# Patient Record
Sex: Female | Born: 1975
Health system: Southern US, Community
[De-identification: ages and names within clinical notes are randomized; demographics above are authoritative.]

## PROBLEM LIST (undated history)

## (undated) DIAGNOSIS — Z8619 Personal history of other infectious and parasitic diseases: Secondary | ICD-10-CM

## (undated) DIAGNOSIS — R51 Headache: Secondary | ICD-10-CM

## (undated) DIAGNOSIS — F32A Depression, unspecified: Secondary | ICD-10-CM

## (undated) DIAGNOSIS — I10 Essential (primary) hypertension: Secondary | ICD-10-CM

## (undated) DIAGNOSIS — F329 Major depressive disorder, single episode, unspecified: Secondary | ICD-10-CM

## (undated) DIAGNOSIS — K5790 Diverticulosis of intestine, part unspecified, without perforation or abscess without bleeding: Secondary | ICD-10-CM

## (undated) DIAGNOSIS — R519 Headache, unspecified: Secondary | ICD-10-CM

## (undated) DIAGNOSIS — R011 Cardiac murmur, unspecified: Secondary | ICD-10-CM

## (undated) DIAGNOSIS — O24419 Gestational diabetes mellitus in pregnancy, unspecified control: Secondary | ICD-10-CM

## (undated) HISTORY — DX: Headache: R51

## (undated) HISTORY — DX: Cardiac murmur, unspecified: R01.1

## (undated) HISTORY — DX: Major depressive disorder, single episode, unspecified: F32.9

## (undated) HISTORY — DX: Personal history of other infectious and parasitic diseases: Z86.19

## (undated) HISTORY — DX: Headache, unspecified: R51.9

## (undated) HISTORY — DX: Diverticulosis of intestine, part unspecified, without perforation or abscess without bleeding: K57.90

## (undated) HISTORY — DX: Gestational diabetes mellitus in pregnancy, unspecified control: O24.419

## (undated) HISTORY — DX: Essential (primary) hypertension: I10

## (undated) HISTORY — DX: Depression, unspecified: F32.A

---

## 2012-02-20 HISTORY — PX: EXCISIONAL HEMORRHOIDECTOMY: SHX1541

## 2014-06-04 ENCOUNTER — Emergency Department: Admit: 2014-06-04 | Discharge status: Against Medical Advice | Payer: Self-pay | Admitting: Student

## 2014-06-04 ENCOUNTER — Emergency Department
Admit: 2014-06-04 | Disposition: A | Discharge status: Home / Self Care | Payer: Self-pay | Admitting: Emergency Medicine

## 2014-06-04 LAB — URINALYSIS, COMPLETE
Bilirubin,UR: NEGATIVE
Blood: NEGATIVE
Glucose,UR: NEGATIVE mg/dL (ref 0–75)
Ketone: NEGATIVE
Leukocyte Esterase: NEGATIVE
NITRITE: NEGATIVE
PH: 6 (ref 4.5–8.0)
PROTEIN: NEGATIVE
SPECIFIC GRAVITY: 1.009 (ref 1.003–1.030)
Squamous Epithelial: NONE SEEN

## 2014-06-04 LAB — CBC WITH DIFFERENTIAL/PLATELET
Basophil #: 0.1 10*3/uL (ref 0.0–0.1)
Basophil %: 0.9 %
EOS PCT: 0.9 %
Eosinophil #: 0.1 10*3/uL (ref 0.0–0.7)
HCT: 40.6 % (ref 35.0–47.0)
HGB: 13.7 g/dL (ref 12.0–16.0)
Lymphocyte #: 2 10*3/uL (ref 1.0–3.6)
Lymphocyte %: 25 %
MCH: 30.8 pg (ref 26.0–34.0)
MCHC: 33.6 g/dL (ref 32.0–36.0)
MCV: 91 fL (ref 80–100)
Monocyte #: 0.5 x10 3/mm (ref 0.2–0.9)
Monocyte %: 6.6 %
Neutrophil #: 5.4 10*3/uL (ref 1.4–6.5)
Neutrophil %: 66.6 %
Platelet: 240 10*3/uL (ref 150–440)
RBC: 4.44 10*6/uL (ref 3.80–5.20)
RDW: 12.6 % (ref 11.5–14.5)
WBC: 8.1 10*3/uL (ref 3.6–11.0)

## 2014-06-04 LAB — COMPREHENSIVE METABOLIC PANEL
AST: 20 U/L
Albumin: 4.4 g/dL
Alkaline Phosphatase: 68 U/L
Anion Gap: 6 — ABNORMAL LOW (ref 7–16)
BUN: 9 mg/dL
Bilirubin,Total: 0.3 mg/dL
CO2: 27 mmol/L
Calcium, Total: 9.4 mg/dL
Chloride: 107 mmol/L
Creatinine: 0.6 mg/dL
EGFR (African American): >60
EGFR (Non-African Amer.): >60
GLUCOSE: 106 mg/dL — AB
POTASSIUM: 4.2 mmol/L
SGPT (ALT): 19 U/L
SODIUM: 140 mmol/L
TOTAL PROTEIN: 7.8 g/dL

## 2014-06-04 LAB — PREGNANCY, URINE: PREGNANCY TEST, URINE: NEGATIVE m[IU]/mL
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
## 2015-12-09 DIAGNOSIS — F411 Generalized anxiety disorder: Secondary | ICD-10-CM | POA: Diagnosis not present

## 2015-12-30 DIAGNOSIS — F411 Generalized anxiety disorder: Secondary | ICD-10-CM | POA: Diagnosis not present

## 2016-01-11 DIAGNOSIS — F411 Generalized anxiety disorder: Secondary | ICD-10-CM | POA: Diagnosis not present

## 2016-01-18 ENCOUNTER — Ambulatory Visit (INDEPENDENT_AMBULATORY_CARE_PROVIDER_SITE_OTHER): Payer: BLUE CROSS/BLUE SHIELD | Admitting: Obstetrics and Gynecology

## 2016-01-18 ENCOUNTER — Encounter: Payer: Self-pay | Admitting: Obstetrics and Gynecology

## 2016-01-18 VITALS — BP 132/91 | HR 69 | Resp 18 | Ht 64.0 in | Wt 195.0 lb

## 2016-01-18 DIAGNOSIS — Z124 Encounter for screening for malignant neoplasm of cervix: Secondary | ICD-10-CM

## 2016-01-18 DIAGNOSIS — Z01419 Encounter for gynecological examination (general) (routine) without abnormal findings: Secondary | ICD-10-CM | POA: Diagnosis not present

## 2016-01-18 DIAGNOSIS — M9901 Segmental and somatic dysfunction of cervical region: Secondary | ICD-10-CM | POA: Diagnosis not present

## 2016-01-18 DIAGNOSIS — Z113 Encounter for screening for infections with a predominantly sexual mode of transmission: Secondary | ICD-10-CM

## 2016-01-18 DIAGNOSIS — Z1151 Encounter for screening for human papillomavirus (HPV): Secondary | ICD-10-CM

## 2016-01-18 DIAGNOSIS — R51 Headache: Secondary | ICD-10-CM | POA: Diagnosis not present

## 2016-01-18 DIAGNOSIS — M5414 Radiculopathy, thoracic region: Secondary | ICD-10-CM | POA: Diagnosis not present

## 2016-01-18 DIAGNOSIS — M9902 Segmental and somatic dysfunction of thoracic region: Secondary | ICD-10-CM | POA: Diagnosis not present

## 2016-01-18 MED ORDER — ETONOGESTREL-ETHINYL ESTRADIOL 0.12-0.015 MG/24HR VA RING: VAGINAL_RING | VAGINAL | 12 refills | Status: DC

## 2016-01-18 NOTE — Progress Notes (Signed)
Subjective:     [name] is a 40 y.o. female G2P1011 who is here for a comprehensive physical exam. The patient reports no problems. She is not sexually active. She denies any abdominal pain or discharge. She reports a monthly period lasting 4-5 days. She is without any complaints  History reviewed. No pertinent past medical history. Past Surgical History:  Procedure Laterality Date  . EXCISIONAL HEMORRHOIDECTOMY     Family History  Problem Relation Age of Onset  . Heart disease Mother     Social History   Social History  . Marital status: Divorced    Spouse name: N/A  . Number of children: N/A  . Years of education: N/A   Occupational History  . Not on file.   Social History Main Topics  . Smoking status: Never Smoker  . Smokeless tobacco: Never Used  . Alcohol use Yes     Comment: occasional   . Drug use: No  . Sexual activity: Not Currently    Birth control/ protection: None   Other Topics Concern  . Not on file   Social History Narrative  . No narrative on file   Health Maintenance  Topic Date Due  . HIV Screening  10/19/1991  . TETANUS/TDAP  10/19/1995  . PAP SMEAR  10/18/1997  . INFLUENZA VACCINE  09/20/2015       Review of Systems Pertinent items are noted in HPI.   Objective:  Blood pressure (!) 132/91, pulse 69, resp. rate 18, height 5\' 4"  (1.626 m), weight 195 lb (88.5 kg), last menstrual period 01/02/2016.     GENERAL: Well-developed, well-nourished female in no acute distress.  HEENT: Normocephalic, atraumatic. Sclerae anicteric.  NECK: Supple. Normal thyroid.  LUNGS: Clear to auscultation bilaterally.  HEART: Regular rate and rhythm. BREASTS: Symmetric in size. No palpable masses or lymphadenopathy, skin changes, or nipple drainage. ABDOMEN: Soft, nontender, nondistended. No organomegaly. PELVIC: Normal external female genitalia. Vagina is pink and rugated.  Normal discharge. Normal appearing cervix. Uterus is normal in size. No  adnexal mass or tenderness. EXTREMITIES: No cyanosis, clubbing, or edema, 2+ distal pulses.    Assessment:    Healthy female exam.      Plan:    Pap smear collected Discussed contraception options and patient is interested in NuvaRing but is considering IUD. Rx NuvaRing provided Screening mammogram is due after her birthday Patient advised to perform monthly self breast and vulva exam Patient will be contacted with any abnormal results See After Visit Summary for Counseling Recommendations

## 2016-01-23 ENCOUNTER — Encounter: Payer: Self-pay | Admitting: *Deleted

## 2016-01-23 LAB — CYTOLOGY - PAP
Adequacy: ABSENT
CHLAMYDIA, DNA PROBE: NEGATIVE
DIAGNOSIS: NEGATIVE
HPV (WINDOPATH): NOT DETECTED
Neisseria Gonorrhea: NEGATIVE

## 2016-01-25 DIAGNOSIS — F411 Generalized anxiety disorder: Secondary | ICD-10-CM | POA: Diagnosis not present

## 2016-02-07 DIAGNOSIS — F411 Generalized anxiety disorder: Secondary | ICD-10-CM | POA: Diagnosis not present

## 2016-02-09 ENCOUNTER — Ambulatory Visit (INDEPENDENT_AMBULATORY_CARE_PROVIDER_SITE_OTHER): Payer: BLUE CROSS/BLUE SHIELD | Admitting: Obstetrics and Gynecology

## 2016-02-09 ENCOUNTER — Encounter: Payer: Self-pay | Admitting: Obstetrics and Gynecology

## 2016-02-09 DIAGNOSIS — I10 Essential (primary) hypertension: Secondary | ICD-10-CM

## 2016-02-09 DIAGNOSIS — Z309 Encounter for contraceptive management, unspecified: Secondary | ICD-10-CM

## 2016-02-10 DIAGNOSIS — I10 Essential (primary) hypertension: Secondary | ICD-10-CM | POA: Insufficient documentation

## 2016-02-10 NOTE — Progress Notes (Addendum)
Obstetrics and Gynecology Visit Return Patient Evaluation  Appointment Date: 02/09/2016  [facility]  [facility]  Chief Complaint:  IUD placement  History of Present Illness: [name] is a 40 y.o. African-American G2P1011 (Patient's last menstrual period was 01/30/2016.), seen for the above chief complaint. Her past medical history is significant for ?HTN.   She was seen by Dr. Jolayne Pantheronstant on 11/29 for an annual and [facility] her. Today, she desires some type of LNG IUD.  She does have painful and heavy periods and they are regular, qmonth, about 5-7d and no intermenstrual bleeding; she hasn't had an IUD before.  Her pap at her annual had negative GC/CT and HPV. Patient states that at her annual Dr. Jolayne Pantheronstant mentioned she felt like she had fibroids on her exam and she had never been told before that she had them.   Review of Systems: as noted in the History of Present Illness.  Past Medical History:  No past medical history on file.  Past Surgical History:  Past Surgical History:  Procedure Laterality Date  . EXCISIONAL HEMORRHOIDECTOMY      Past Obstetrical History:  OB History  Gravida Para Term Preterm AB Living  2 1 1   1 1   SAB TAB Ectopic Multiple Live Births  1       1    # Outcome Date GA Lbr Len/2nd Weight Sex Delivery Anes PTL Lv  2 Term 11/30/03 3223w0d  8 lb (3.629 kg) M Vag-Spont   LIV  1 SAB  1089w0d             Past Gynecological History: As per HPI.  Social History:  Social History   Social History  . Marital status: Divorced    Spouse name: N/A  . Number of children: N/A  . Years of education: N/A   Occupational History  . Not on file.   Social History Main Topics  . Smoking status: Never Smoker  . Smokeless tobacco: Never Used  . Alcohol use Yes     Comment: occasional   . Drug use: No  . Sexual activity: Not Currently    Birth control/ protection: None    Other Topics Concern  . Not on file   Social History Narrative  . No narrative on file    Family History:  Family History  Problem Relation Age of Onset  . Heart disease Mother     Unsure    Medications [name] had no medications administered during this visit. Current Outpatient Prescriptions  Medication Sig Dispense Refill  . Naproxen Sodium (ALEVE PO) Take 1 tablet by mouth as needed.     No current facility-administered medications for this visit.     Allergies Patient has no known allergies.   Physical Exam:  BP (!) 130/92   Pulse 82   Resp 16   Ht 5\' 4"  (1.626 m)   Wt 199 lb (90.3 kg)   LMP 01/30/2016   BMI 34.16 kg/m  Body mass index is 34.16 kg/m. General appearance: Well nourished, well developed female in no acute distress.   EGBUS normal  Laboratory: as above  Radiology: TVUS done  Midplane 9.1 x 4.4 x 4.8cm, normal appearing uterus, no fibroids noted, ES 10mm and uniform with no distortion of the cavity. no FF in the pelvis. Ovaries normal: LO 1.6 x 1.7 x 2cm and RO 2 x 1.1 x 1.2cm  Assessment: Pt doing  well  Plan:  Options d/w the patient and she desires a Mirena. We don't have any in stock so will order it today and bring her back next week for placement. U/s normal today.   RTC 1wk for Mirena placement.   >50% of face to face time for 8183m visit.   [name], Jr MD Attending [facility](Faculty Practice)

## 2016-02-14 ENCOUNTER — Ambulatory Visit (INDEPENDENT_AMBULATORY_CARE_PROVIDER_SITE_OTHER): Payer: BLUE CROSS/BLUE SHIELD | Admitting: Obstetrics and Gynecology

## 2016-02-14 ENCOUNTER — Encounter: Payer: Self-pay | Admitting: Obstetrics and Gynecology

## 2016-02-14 VITALS — BP 130/82 | HR 82 | Ht 64.0 in | Wt 199.0 lb

## 2016-02-14 DIAGNOSIS — Z30430 Encounter for insertion of intrauterine contraceptive device: Secondary | ICD-10-CM

## 2016-02-14 MED ORDER — LEVONORGESTREL 20 MCG/24HR IU IUD
INTRAUTERINE_SYSTEM | Freq: Once | INTRAUTERINE | Status: AC
  Administered 2016-02-14: 16:00:00 via INTRAUTERINE

## 2016-02-14 NOTE — Addendum Note (Signed)
Addended by: Arne ClevelandHUTCHINSON, MANDY J on: 02/14/2016 04:23 PM   Modules accepted: Orders

## 2016-02-14 NOTE — Progress Notes (Signed)
Intrauterine Device (Mirena) Insertion Procedure Note  After a recent pap (results: negative/date: 12/2015) was confirmed, written consent was obtained. The patient understands the risks of IUD placement, which include but are not limited to: bleeding, infection, uterine perforation, risk of expulsion, risk of failure < 1%, increased risk of ectopic pregnancy in the event of failure.   Prior to the procedure being performed, the patient (or guardian) was asked to state their full name, date of birth, and the type of procedure being performed. A bimanual exam showed the uterus to be anteverted.  Next, the cervix and vagina were cleaned with an antiseptic solution, and the cervix was grasped with a tenaculum.  The uterus was sounded to 8.5 cm.  The Mirena was placed without difficulty in the usual, sterile fashion.  The strings were cut to 3-4 cm.  The tenaculum was removed and cervix was found to be hemostatic after application of silver nitrate to the tenaculum sites.   No complications, patient tolerated the procedure well.  Cornelia Copaharlie Vishwa Dais, Jr MD Attending [facility](Faculty Practice)

## 2016-02-20 DIAGNOSIS — K5790 Diverticulosis of intestine, part unspecified, without perforation or abscess without bleeding: Secondary | ICD-10-CM

## 2016-02-20 HISTORY — DX: Diverticulosis of intestine, part unspecified, without perforation or abscess without bleeding: K57.90

## 2016-02-21 DIAGNOSIS — F411 Generalized anxiety disorder: Secondary | ICD-10-CM | POA: Diagnosis not present

## 2016-03-06 DIAGNOSIS — R030 Elevated blood-pressure reading, without diagnosis of hypertension: Secondary | ICD-10-CM | POA: Diagnosis not present

## 2016-03-06 DIAGNOSIS — Z131 Encounter for screening for diabetes mellitus: Secondary | ICD-10-CM | POA: Diagnosis not present

## 2016-03-15 ENCOUNTER — Ambulatory Visit: Payer: BLUE CROSS/BLUE SHIELD | Admitting: Obstetrics and Gynecology

## 2016-03-29 ENCOUNTER — Ambulatory Visit (INDEPENDENT_AMBULATORY_CARE_PROVIDER_SITE_OTHER): Payer: BLUE CROSS/BLUE SHIELD | Admitting: Obstetrics & Gynecology

## 2016-03-29 VITALS — BP 125/87 | HR 84 | Resp 18 | Ht 64.0 in | Wt 201.0 lb

## 2016-03-29 DIAGNOSIS — Z30431 Encounter for routine checking of intrauterine contraceptive device: Secondary | ICD-10-CM

## 2016-03-29 DIAGNOSIS — R51 Headache: Secondary | ICD-10-CM | POA: Diagnosis not present

## 2016-03-29 DIAGNOSIS — M9902 Segmental and somatic dysfunction of thoracic region: Secondary | ICD-10-CM | POA: Diagnosis not present

## 2016-03-29 DIAGNOSIS — M5414 Radiculopathy, thoracic region: Secondary | ICD-10-CM | POA: Diagnosis not present

## 2016-03-29 DIAGNOSIS — M9901 Segmental and somatic dysfunction of cervical region: Secondary | ICD-10-CM | POA: Diagnosis not present

## 2016-03-29 NOTE — Progress Notes (Signed)
   Subjective:    Patient ID: Julie LenisMichelle Vann Horton, female    DOB: Apr 23, 1975, 41 y.o.   MRN: [id number]  HPI  41 yo S AA P1 31(40 yo son) here for a string check. She has no pain and has had mostly daily small amount of spotting.  Review of Systems     Objective:   Physical Exam [id number] Breathing, conversing, and ambulating normally Mirena strings visualiazed, no bleeding noted       Assessment & Plan:  Mirena- rec IBU prn bleeding, offered OCPs prn but she doesn't want them at this time RTC 3 years/prn

## 2016-05-10 DIAGNOSIS — M9901 Segmental and somatic dysfunction of cervical region: Secondary | ICD-10-CM | POA: Diagnosis not present

## 2016-05-10 DIAGNOSIS — R51 Headache: Secondary | ICD-10-CM | POA: Diagnosis not present

## 2016-05-10 DIAGNOSIS — M5414 Radiculopathy, thoracic region: Secondary | ICD-10-CM | POA: Diagnosis not present

## 2016-05-10 DIAGNOSIS — M9902 Segmental and somatic dysfunction of thoracic region: Secondary | ICD-10-CM | POA: Diagnosis not present

## 2016-05-21 DIAGNOSIS — F331 Major depressive disorder, recurrent, moderate: Secondary | ICD-10-CM | POA: Diagnosis not present

## 2016-05-23 DIAGNOSIS — R109 Unspecified abdominal pain: Secondary | ICD-10-CM | POA: Diagnosis not present

## 2016-05-24 ENCOUNTER — Other Ambulatory Visit: Payer: Self-pay | Admitting: Family Medicine

## 2016-05-24 DIAGNOSIS — Z1389 Encounter for screening for other disorder: Secondary | ICD-10-CM | POA: Diagnosis not present

## 2016-05-24 DIAGNOSIS — R1011 Right upper quadrant pain: Secondary | ICD-10-CM

## 2016-05-24 DIAGNOSIS — R1031 Right lower quadrant pain: Secondary | ICD-10-CM | POA: Diagnosis not present

## 2016-05-24 DIAGNOSIS — R946 Abnormal results of thyroid function studies: Secondary | ICD-10-CM | POA: Diagnosis not present

## 2016-05-25 ENCOUNTER — Ambulatory Visit
Admission: RE | Admit: 2016-05-25 | Discharge: 2016-05-25 | Disposition: A | Discharge status: Home / Self Care | Payer: BLUE CROSS/BLUE SHIELD | Source: Ambulatory Visit | Attending: Family Medicine | Admitting: Family Medicine

## 2016-05-25 DIAGNOSIS — R1011 Right upper quadrant pain: Secondary | ICD-10-CM

## 2016-05-25 DIAGNOSIS — R1031 Right lower quadrant pain: Secondary | ICD-10-CM | POA: Diagnosis not present

## 2016-05-28 DIAGNOSIS — R1011 Right upper quadrant pain: Secondary | ICD-10-CM | POA: Diagnosis not present

## 2016-05-30 ENCOUNTER — Telehealth: Payer: Self-pay | Admitting: General Surgery

## 2016-05-30 NOTE — Telephone Encounter (Signed)
L/M FOR PT TO RETURN CALL SO WE CAN GET HER PRIMARY CARE INFORMATION(INCLUDING OFC #) AND RECORDS FOR APPOINTMENT ON 06-05-16.PLEASE LET MELANIE KNOW THIS INFORMATION.

## 2016-05-31 ENCOUNTER — Emergency Department
Admission: EM | Admit: 2016-05-31 | Discharge: 2016-05-31 | Disposition: A | Discharge status: Home / Self Care | Payer: BLUE CROSS/BLUE SHIELD | Source: Home / Self Care

## 2016-05-31 ENCOUNTER — Ambulatory Visit (INDEPENDENT_AMBULATORY_CARE_PROVIDER_SITE_OTHER): Payer: BLUE CROSS/BLUE SHIELD | Admitting: General Surgery

## 2016-05-31 ENCOUNTER — Encounter: Payer: Self-pay | Admitting: *Deleted

## 2016-05-31 ENCOUNTER — Ambulatory Visit
Admission: RE | Admit: 2016-05-31 | Discharge: 2016-05-31 | Disposition: A | Discharge status: Home / Self Care | Payer: BLUE CROSS/BLUE SHIELD | Source: Ambulatory Visit | Attending: General Surgery | Admitting: General Surgery

## 2016-05-31 ENCOUNTER — Encounter: Payer: Self-pay | Admitting: General Surgery

## 2016-05-31 ENCOUNTER — Other Ambulatory Visit
Admission: RE | Admit: 2016-05-31 | Discharge: 2016-05-31 | Disposition: A | Discharge status: Home / Self Care | Payer: BLUE CROSS/BLUE SHIELD | Source: Ambulatory Visit | Attending: General Surgery | Admitting: General Surgery

## 2016-05-31 VITALS — BP 128/76 | HR 70 | Temp 98.1°F | Resp 12 | Ht 64.0 in | Wt 194.0 lb

## 2016-05-31 DIAGNOSIS — R1031 Right lower quadrant pain: Secondary | ICD-10-CM

## 2016-05-31 DIAGNOSIS — Z5321 Procedure and treatment not carried out due to patient leaving prior to being seen by health care provider: Secondary | ICD-10-CM

## 2016-05-31 DIAGNOSIS — Z87891 Personal history of nicotine dependence: Secondary | ICD-10-CM | POA: Insufficient documentation

## 2016-05-31 DIAGNOSIS — R1110 Vomiting, unspecified: Secondary | ICD-10-CM | POA: Diagnosis not present

## 2016-05-31 DIAGNOSIS — R197 Diarrhea, unspecified: Secondary | ICD-10-CM | POA: Diagnosis not present

## 2016-05-31 DIAGNOSIS — K5720 Diverticulitis of large intestine with perforation and abscess without bleeding: Secondary | ICD-10-CM | POA: Diagnosis not present

## 2016-05-31 LAB — CBC WITH DIFFERENTIAL/PLATELET
Basophils Absolute: 0.1 10*3/uL (ref 0–0.1)
Basophils Relative: 1 %
EOS ABS: 0 10*3/uL (ref 0–0.7)
EOS PCT: 0 %
HCT: 39.6 % (ref 35.0–47.0)
Hemoglobin: 13.3 g/dL (ref 12.0–16.0)
Lymphocytes Relative: 12 %
Lymphs Abs: 1.3 10*3/uL (ref 1.0–3.6)
MCH: 29.9 pg (ref 26.0–34.0)
MCHC: 33.6 g/dL (ref 32.0–36.0)
MCV: 88.9 fL (ref 80.0–100.0)
Monocytes Absolute: 0.8 10*3/uL (ref 0.2–0.9)
Monocytes Relative: 8 %
Neutro Abs: 8.5 10*3/uL — ABNORMAL HIGH (ref 1.4–6.5)
Neutrophils Relative %: 79 %
Platelets: 292 10*3/uL (ref 150–440)
RBC: 4.45 MIL/uL (ref 3.80–5.20)
RDW: 12.9 % (ref 11.5–14.5)
WBC: 10.8 10*3/uL (ref 3.6–11.0)

## 2016-05-31 IMAGING — CT CT ABD-PELV W/ CM
2 of 4 series · 15 of 46 positions shown, 17 images · IV contrast (APPLIED)
Comparison: None.

CLINICAL DATA: Eleven day history of right lower quadrant abdominal
pain common nausea, vomiting and diarrhea.

EXAM:
CT ABDOMEN AND PELVIS WITH CONTRAST
TECHNIQUE: Multidetector CT imaging of the abdomen and pelvis was performed
using the standard protocol following bolus administration of
intravenous contrast.
CONTRAST:  100mL [PV] IOPAMIDOL ([PV]) INJECTION 61%

[Series 2: axial st · axial · 0.70mm/px · z∈[-450,-60]mm · 12 of 90 slices shown, 14 images]
[im 8/90  soft-tissue]
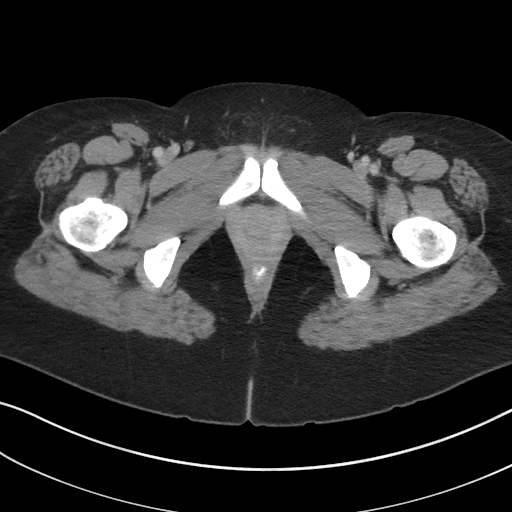
[im 8/90  bone]
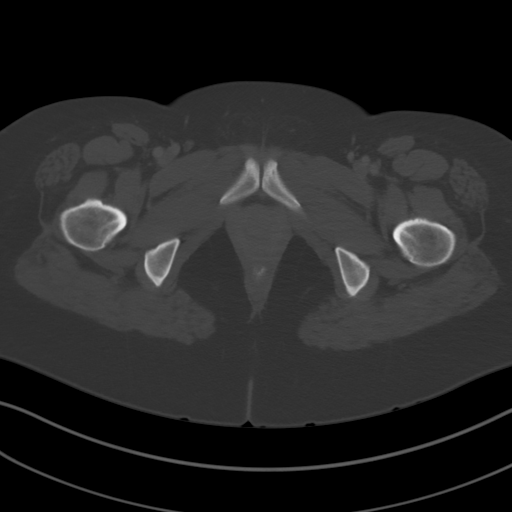
[im 15/90  soft-tissue]
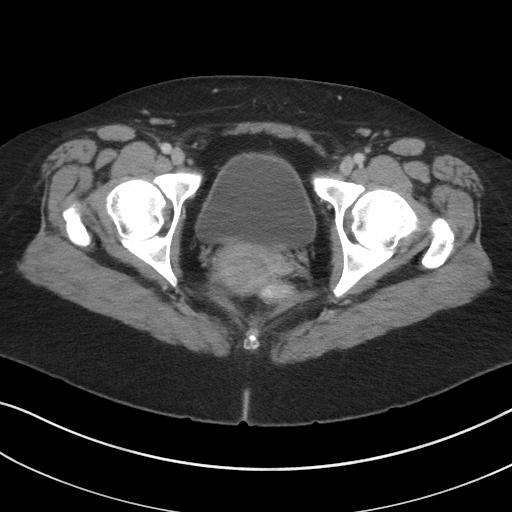
[im 22/90  soft-tissue]
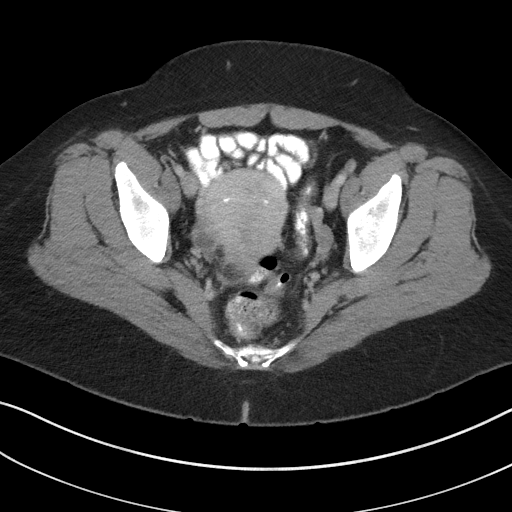
[im 29/90  soft-tissue]
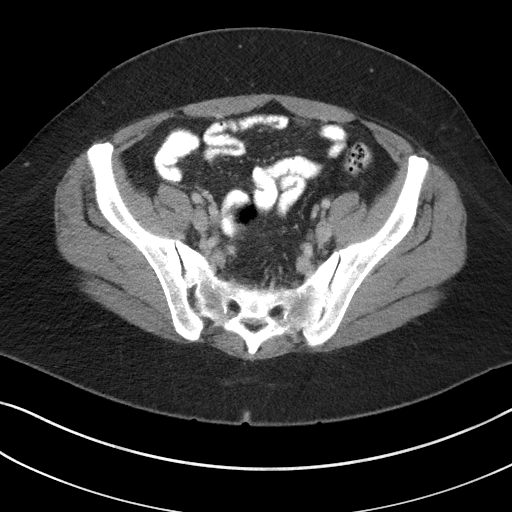
[im 36/90  soft-tissue]
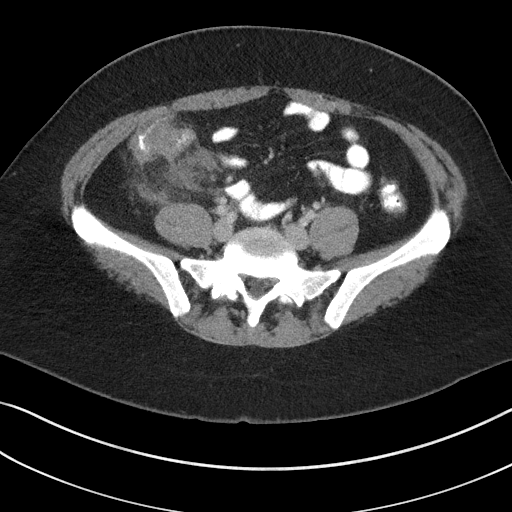
[im 43/90  soft-tissue]
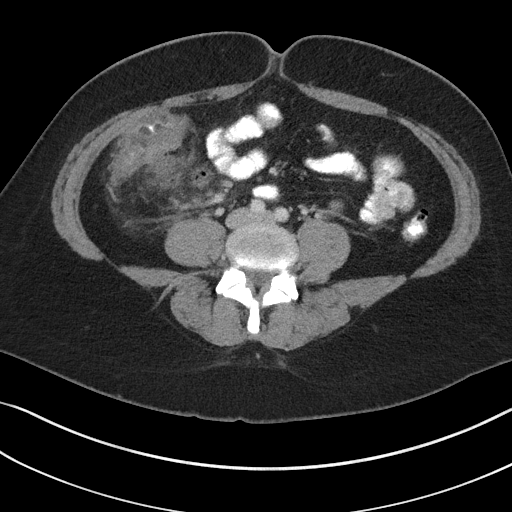
[im 50/90  soft-tissue]
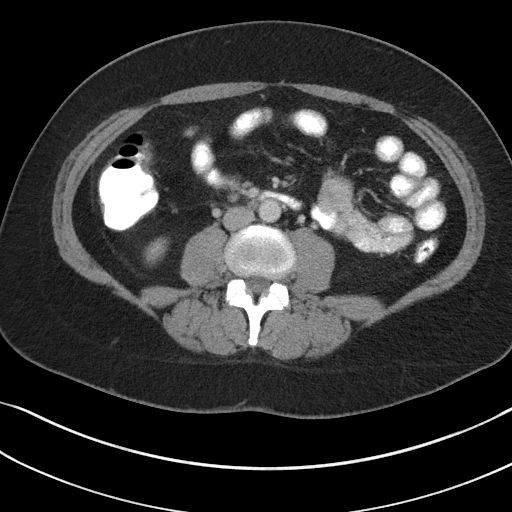
[im 57/90  soft-tissue]
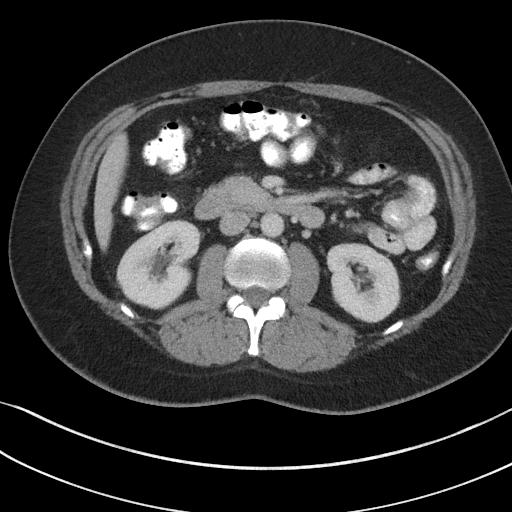
[im 65/90  soft-tissue]
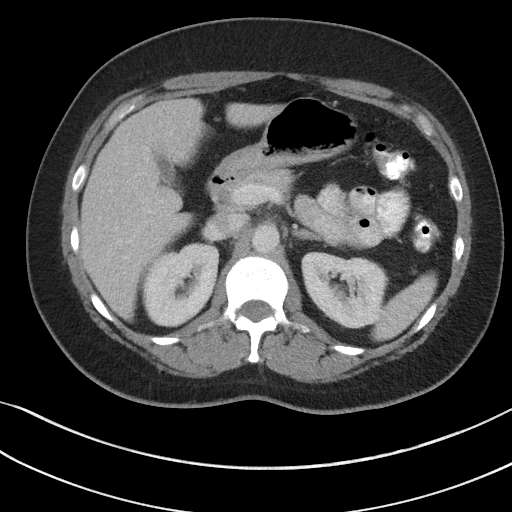
[im 65/90  bone]
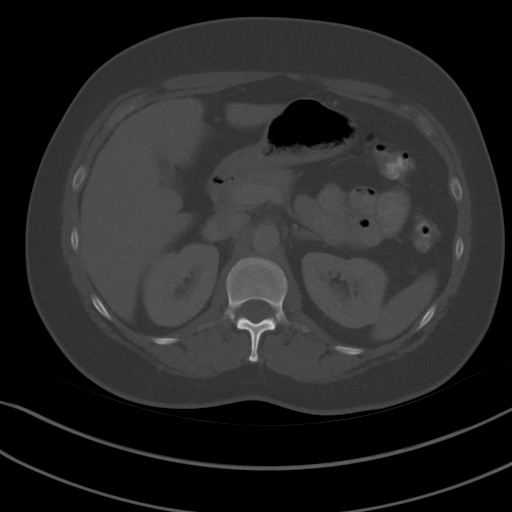
[im 72/90  soft-tissue]
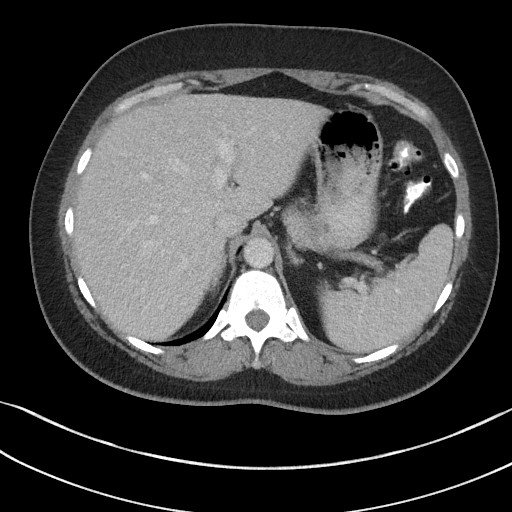
[im 79/90  soft-tissue]
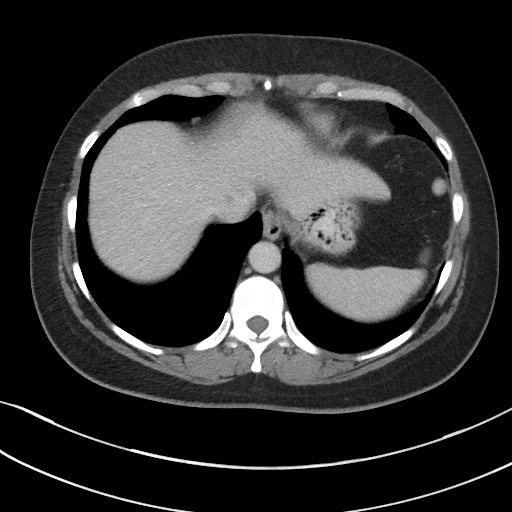
[im 86/90  soft-tissue]
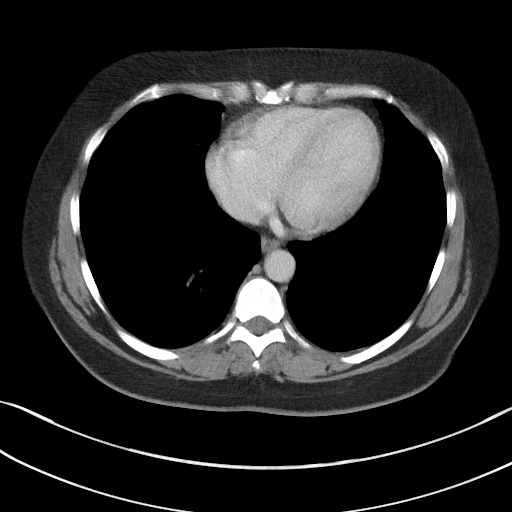

[Series 5: coronal st · coronal · 0.68mm/px · 3 of 80 slices shown]
[im 27/80  soft-tissue]
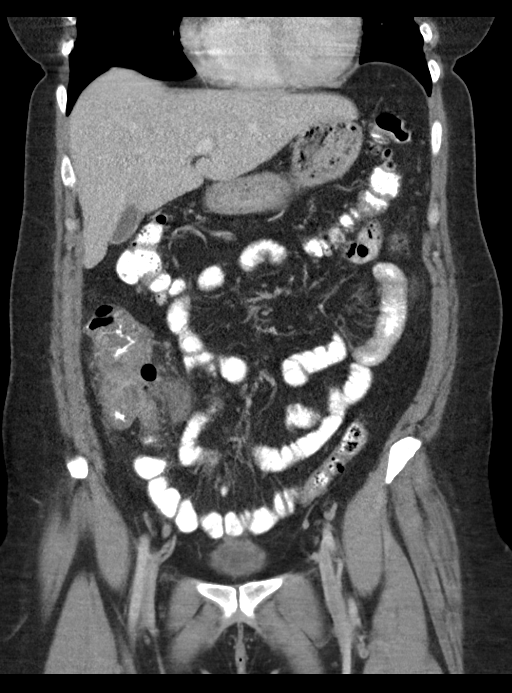
[im 36/80  soft-tissue]
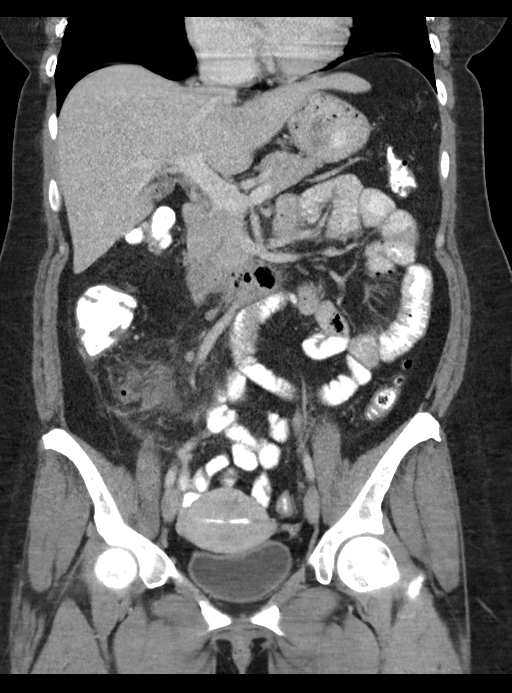
[im 44/80  soft-tissue]
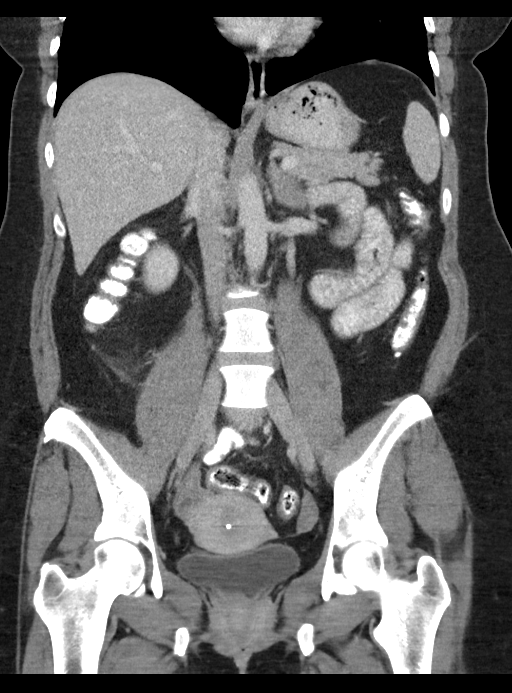

[15 of 46 positions shown; findings below may reference images not displayed]

FINDINGS: Lower chest: The lung bases are clear of acute process. No pleural
effusion or pulmonary lesions. The heart is normal in size. No
pericardial effusion. The distal esophagus and aorta are
unremarkable.

Hepatobiliary: No focal hepatic lesions or intrahepatic biliary
dilatation. The gallbladder is normal. No common bile duct
dilatation.

Pancreas:  No mass, inflammation or ductal dilatation.

Spleen: Normal size.  No focal lesions.

Adrenals/Urinary Tract: The adrenal glands and kidneys are
unremarkable. The bladder is normal.

Stomach/Bowel: The stomach, duodenum and small bowel are
unremarkable except at the terminal ileum is markedly inflamed. This
is likely secondary inflammation due to would appears to be
perforated cecal diverticulitis with a diverticular abscess in the
right lower quadrant measuring a maximum of 5.4 cm. The nearby
appendix is normal.

The remainder of the colon is unremarkable except for descending and
sigmoid diverticulosis.

Vascular/Lymphatic: The aorta and branch vessels are normal. The
major venous structures are patent. Small scattered mesenteric and
retroperitoneal lymph nodes and small pericecal inflammatory lymph
nodes.

Reproductive: The uterus and ovaries are unremarkable. An IUD is
noted in the endometrial canal.

Other: No pelvic mass or adenopathy. No free pelvic fluid
collections. No inguinal mass or adenopathy. No abdominal wall
hernia or subcutaneous lesions.

Musculoskeletal: No significant bony findings.
IMPRESSION: 1. CT findings most consistent with perforated cecal diverticulitis
with a diverticular abscess.
2. Secondary inflammation of the terminal ileum. The appendix is
normal.
3. No other significant abdominal/pelvic findings.

## 2016-05-31 MED ORDER — IOPAMIDOL (ISOVUE-300) INJECTION 61%
100.0000 mL | Freq: Once | INTRAVENOUS | Status: AC | PRN
  Administered 2016-05-31: 100 mL via INTRAVENOUS

## 2016-05-31 MED ORDER — SODIUM CHLORIDE 0.9 % IV SOLN
1.0000 g | Freq: Once | INTRAVENOUS | Status: AC
  Administered 2016-05-31: 1 g via INTRAVENOUS
  Filled 2016-05-31: qty 1
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
## 2016-05-31 NOTE — Addendum Note (Signed)
Addended by: Nicholes Mango on: 05/31/2016 04:06 PM   Modules accepted: Level of Service

## 2016-05-31 NOTE — ED Notes (Signed)
Called pharmacy to request medication 

## 2016-05-31 NOTE — ED Triage Notes (Signed)
Patient ambulatory to triage with steady gait, without difficulty or distress noted, brought over by Dr Birdie Sons from CT to receive IV dose of Invanz only; does not need to be evaluated by ED provider

## 2016-05-31 NOTE — Patient Instructions (Signed)
Patient to have a ct scan done.   

## 2016-05-31 NOTE — ED Triage Notes (Signed)
Pt here for iv antibodics only per dr byrnett.  Pt had ct scan today.  Pt alert.

## 2016-06-01 ENCOUNTER — Telehealth: Payer: Self-pay

## 2016-06-01 NOTE — Telephone Encounter (Signed)
Patient called to give an update on her condition. She states that she does feel a little better. Belly pain has lessened. Still has some chills but no fever. She is aware to call with any changes in her condition.

## 2016-06-05 ENCOUNTER — Ambulatory Visit (INDEPENDENT_AMBULATORY_CARE_PROVIDER_SITE_OTHER): Payer: BLUE CROSS/BLUE SHIELD | Admitting: General Surgery

## 2016-06-05 ENCOUNTER — Encounter: Payer: Self-pay | Admitting: General Surgery

## 2016-06-05 ENCOUNTER — Ambulatory Visit: Payer: Self-pay | Admitting: General Surgery

## 2016-06-05 VITALS — BP 124/76 | HR 78 | Temp 96.9°F | Resp 12 | Ht 64.0 in | Wt 195.0 lb

## 2016-06-05 DIAGNOSIS — K5732 Diverticulitis of large intestine without perforation or abscess without bleeding: Secondary | ICD-10-CM | POA: Diagnosis not present

## 2016-06-05 NOTE — Progress Notes (Signed)
Patient ID: [name], female   DOB: 1975/02/27, 41 y.o.   MRN: [id number]  Chief Complaint  Patient presents with  . Follow-up    HPI [name] is a 41 y.o. female here today for her follow up right abdominal pain. She states the pain is much better and the diarrhea has turn into to soft stools. No fevers or chills. The patient reports no ill effects from her Augmentin.  The patient is still experiencing some early satiety. No nausea or vomiting.  HPI  No past medical history on file.  Past Surgical History:  Procedure Laterality Date  . EXCISIONAL HEMORRHOIDECTOMY  2014    Family History  Problem Relation Age of Onset  . Heart disease Mother     Unsure    Social History Social History  Substance Use Topics  . Smoking status: Former Smoker    Years: 14.00    Types: Cigarettes  . Smokeless tobacco: Never Used  . Alcohol use Yes     Comment: occasional     No Known Allergies  Current Outpatient Prescriptions  Medication Sig Dispense Refill  . levonorgestrel (MIRENA) 20 MCG/24HR IUD 1 each by Intrauterine route once.     No current facility-administered medications for this visit.     Review of Systems Review of Systems  Constitutional: Negative.   Respiratory: Negative.   Gastrointestinal: Negative.     Blood pressure 124/76, pulse 78, temperature (!) 96.9 F (36.1 C), temperature source Oral, resp. rate 12, height  (1.626 m), weight 195 lb (88.5 kg).  Physical Exam Physical Exam  Constitutional: She is oriented to person, place, and time. She appears well-developed and well-nourished.  Cardiovascular: Normal rate, regular rhythm and normal heart sounds.   Pulmonary/Chest: Effort normal and breath sounds normal.  Abdominal: Soft. Normal appearance and bowel sounds are normal. There is no hepatomegaly. There is no tenderness.    No referred pain or peritoneal irritation.  Neurological: She is alert and oriented to person, place,  and time.      Assessment    Cecal diverticulitis versus appendicitis.    Plan    The patient is doing very well and looks significantly more comfortable. We'll plan for her to complete her present Augmentin Rx. She'll call if she develops any recurrent symptoms, otherwise will obtain a follow-up CT next week.      If her symptoms resolve she'll likely be a candidate for a colonoscopy to assess the right colon.  Patient to be scheduled for a CT abdomen/pelvis with contrast on 06/14/2016.  The patient is aware to call back for any questions or concerns.   HPI, Physical Exam, Assessment and Plan have been scribed under the direction and in the presence of Donnalee Curry, MD.  Ples Specter, CMA   I have completed the exam and reviewed the above documentation for accuracy and completeness.  I agree with the above.  Museum/gallery conservator has been used and any errors in dictation or transcription are unintentional.  Donnalee Curry, M.D., F.A.C.S. The patient is scheduled for a CT abdomen and pelvis at [facility] on 06/14/16 at 9:00 am. She will arrive by 8:45 am and will need to have nothing to eat or drink for 4 hours prior. The patient is aware of date, time, and instructions.  Documented by Sinda Du LPN   Earline Mayotte 06/06/2016, 9:17 PM

## 2016-06-05 NOTE — Patient Instructions (Addendum)
Patient to be scheduled for a CT abdomen/pelvis with contrast on 06/14/2016 The patient is aware to call back for any questions or concerns.  The patient is scheduled for a CT abdomen and pelvis at [facility] on 06/14/16 at 9:00 am. She will arrive by 8:45 am and will need to have nothing to eat or drink for 4 hours prior. The patient is aware of date, time, and instructions.

## 2016-06-13 ENCOUNTER — Telehealth: Payer: Self-pay | Admitting: *Deleted

## 2016-06-13 ENCOUNTER — Encounter: Payer: Self-pay | Admitting: *Deleted

## 2016-06-13 ENCOUNTER — Other Ambulatory Visit
Admission: RE | Admit: 2016-06-13 | Discharge: 2016-06-13 | Disposition: A | Discharge status: Home / Self Care | Payer: BLUE CROSS/BLUE SHIELD | Source: Ambulatory Visit | Attending: General Surgery | Admitting: General Surgery

## 2016-06-13 ENCOUNTER — Other Ambulatory Visit: Payer: Self-pay | Admitting: *Deleted

## 2016-06-13 DIAGNOSIS — Z3200 Encounter for pregnancy test, result unknown: Secondary | ICD-10-CM

## 2016-06-13 LAB — PREGNANCY, URINE: Preg Test, Ur: NEGATIVE

## 2016-06-13 NOTE — Telephone Encounter (Signed)
Message for patient to call the office.   Patient needs to have a urine pregnancy test at [facility] within 24 hours of CT scan per department since she has an IUD and no definite LMP date. CT scheduled for tomorrow, 06-14-16. Patient will need to go today so we can make sure they have results by scan time tomorrow.  Order in EPIC.

## 2016-06-13 NOTE — Telephone Encounter (Signed)
Patient called back and is aware. She will try and stop by [facility] and have this done by 5:30 pm today.

## 2016-06-14 ENCOUNTER — Ambulatory Visit
Admission: RE | Admit: 2016-06-14 | Discharge: 2016-06-14 | Disposition: A | Discharge status: Home / Self Care | Payer: BLUE CROSS/BLUE SHIELD | Source: Ambulatory Visit | Attending: General Surgery | Admitting: General Surgery

## 2016-06-14 ENCOUNTER — Telehealth: Payer: Self-pay

## 2016-06-14 DIAGNOSIS — K5732 Diverticulitis of large intestine without perforation or abscess without bleeding: Secondary | ICD-10-CM

## 2016-06-14 DIAGNOSIS — K5730 Diverticulosis of large intestine without perforation or abscess without bleeding: Secondary | ICD-10-CM | POA: Diagnosis not present

## 2016-06-14 IMAGING — CT CT ABD-PELV W/ CM
1 of 2 series · 15 of 32 positions shown, 19 images · IV contrast (APPLIED)
Comparison: [DATE]

CLINICAL DATA: Diverticulitis follow-up.

EXAM:
CT ABDOMEN AND PELVIS WITH CONTRAST
TECHNIQUE: Multidetector CT imaging of the abdomen and pelvis was performed
using the standard protocol following bolus administration of
intravenous contrast.
CONTRAST:  100mL [X3] IOPAMIDOL ([X3]) INJECTION 61%

[Series 2: axial st · axial · 0.71mm/px · z∈[-889,-454]mm · 15 of 95 slices shown, 19 images]
[im 4/95  soft-tissue]
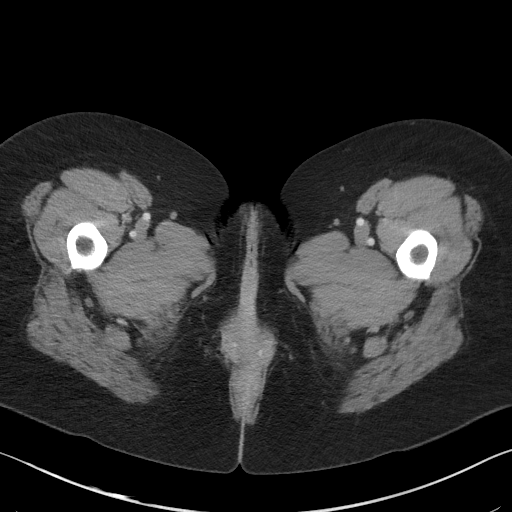
[im 4/95  bone]
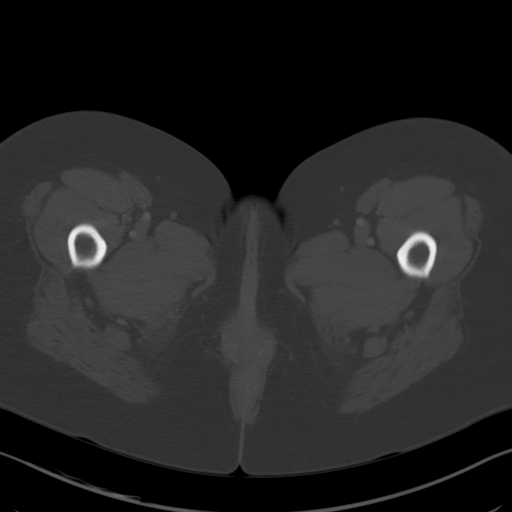
[im 12/95  soft-tissue]
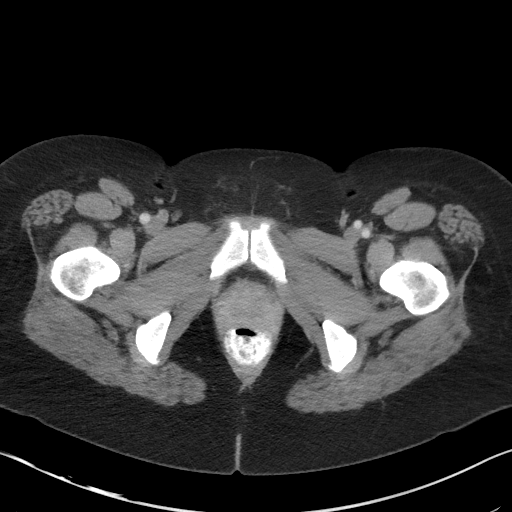
[im 20/95  soft-tissue]
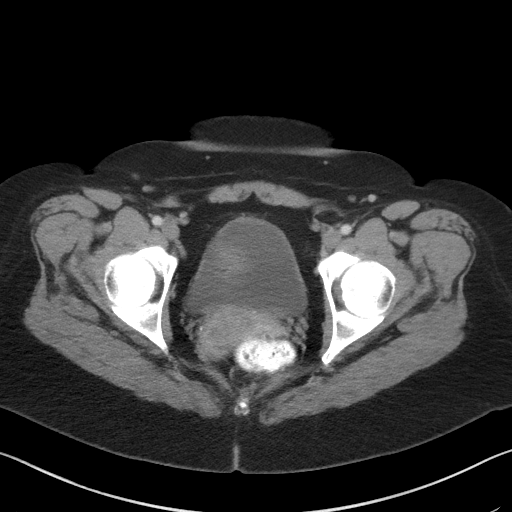
[im 28/95  soft-tissue]
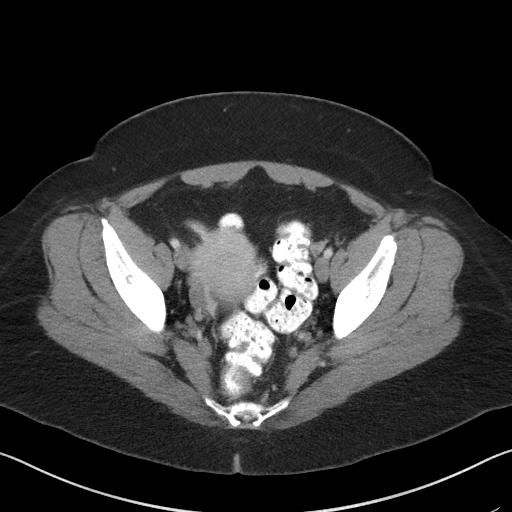
[im 32/95  soft-tissue]
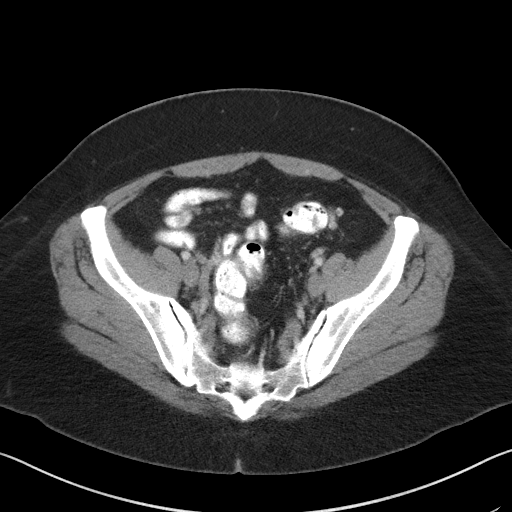
[im 40/95  soft-tissue]
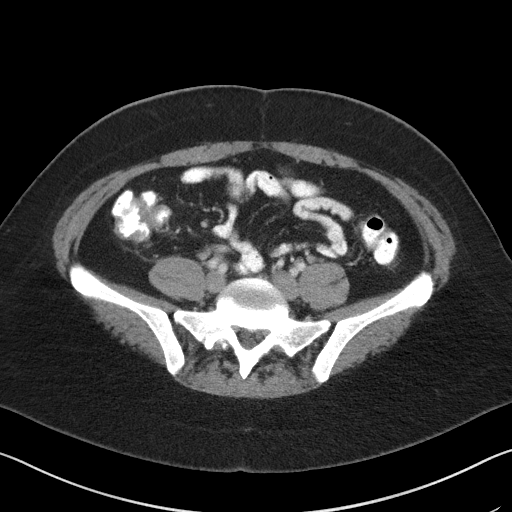
[im 48/95  soft-tissue]
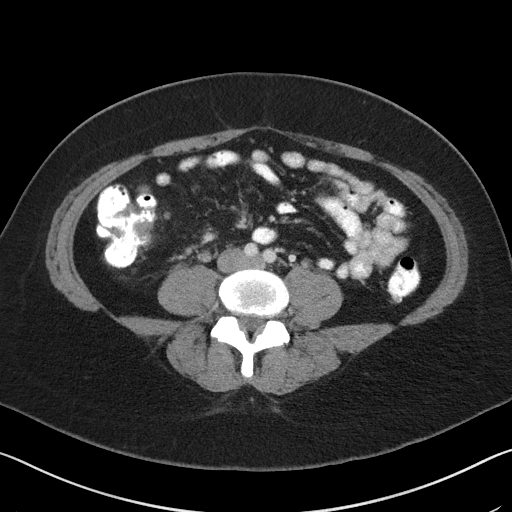
[im 55/95  soft-tissue]
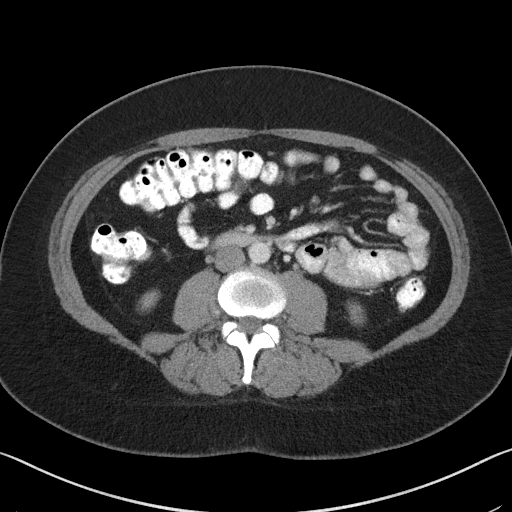
[im 63/95  soft-tissue]
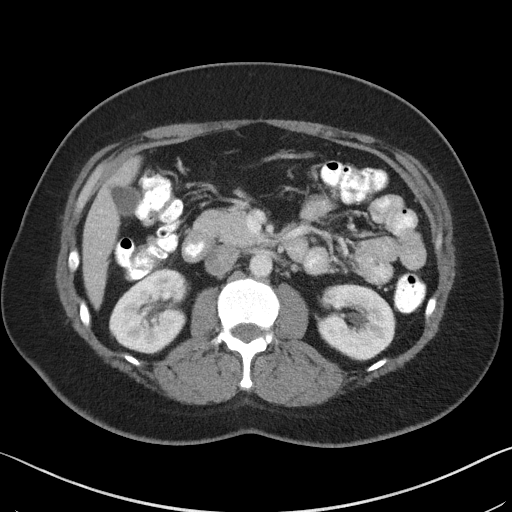
[im 63/95  bone]
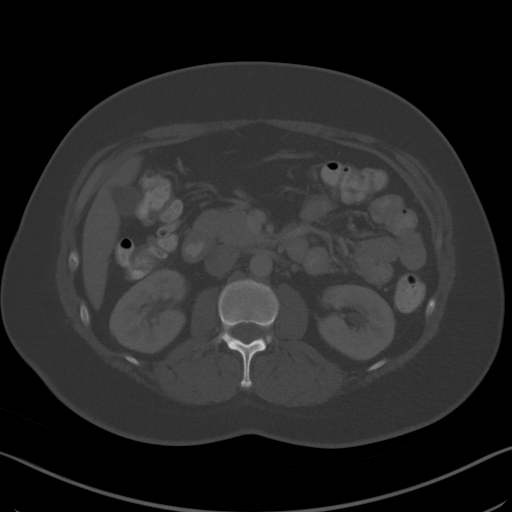
[im 67/95  soft-tissue]
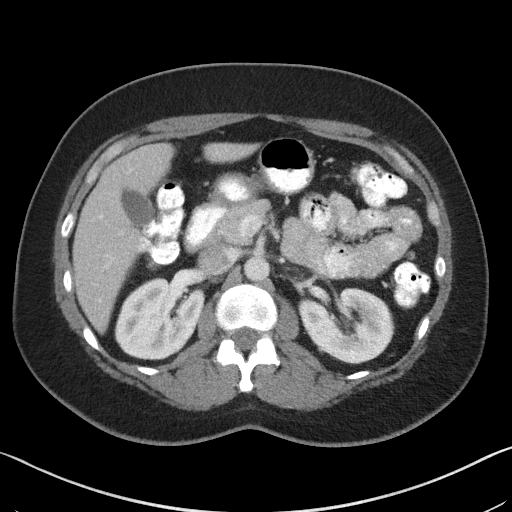
[im 75/95  soft-tissue]
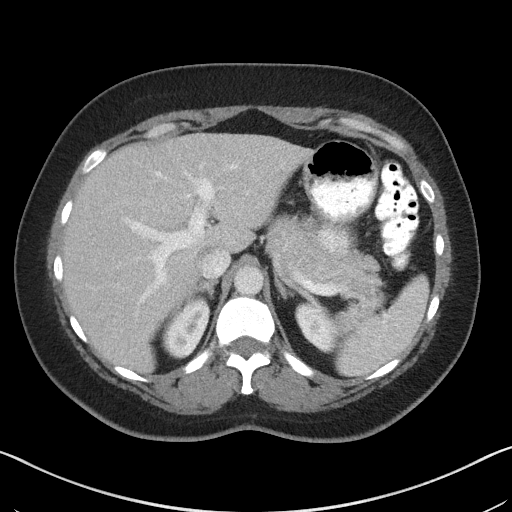
[im 79/95  lung]
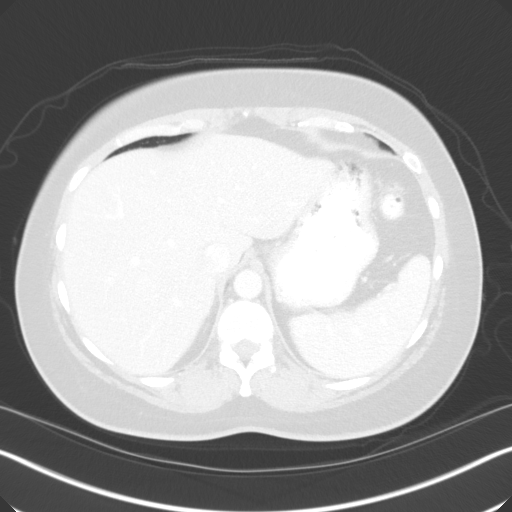
[im 83/95  soft-tissue]
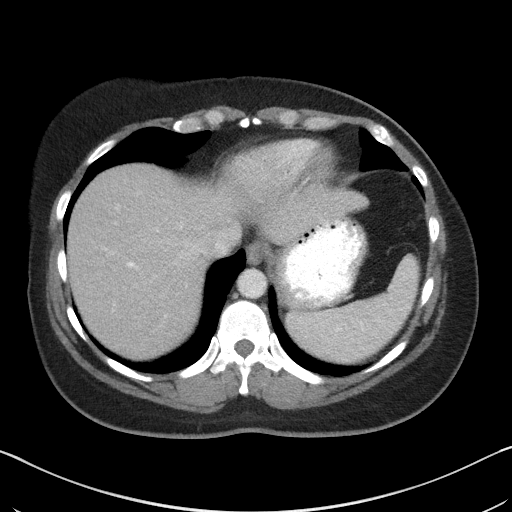
[im 83/95  lung]
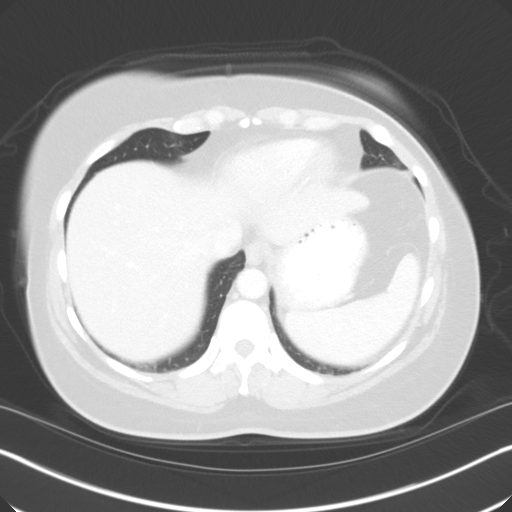
[im 87/95  lung]
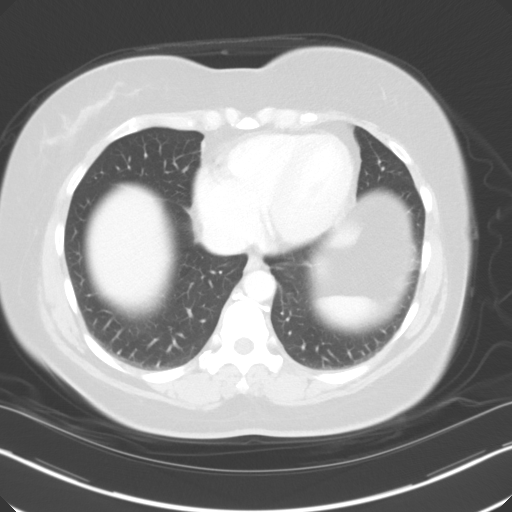
[im 91/95  soft-tissue]
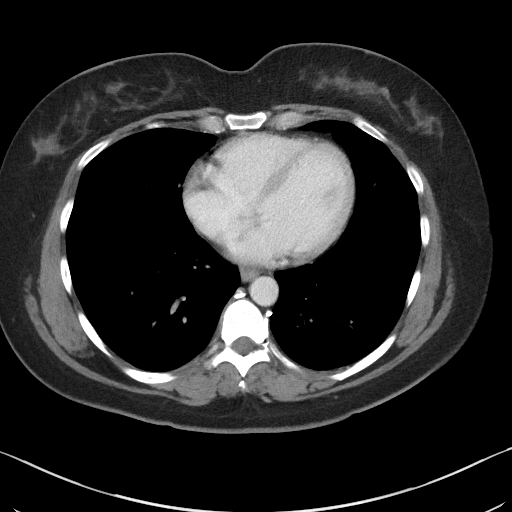
[im 91/95  lung]
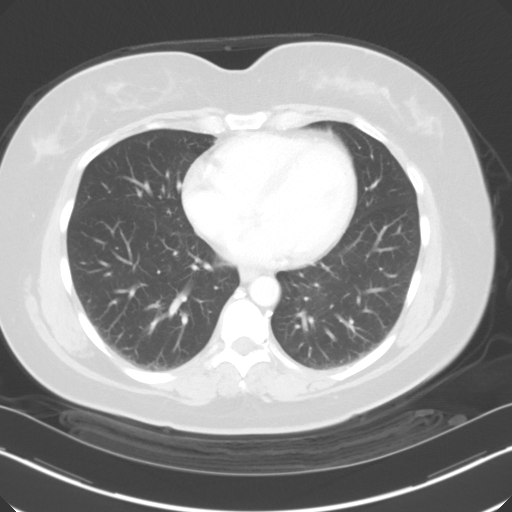

[15 of 32 positions shown; findings below may reference images not displayed]

FINDINGS: Lower chest:  No contributory findings.

Hepatobiliary: No focal liver abnormality.No evidence of biliary
obstruction or stone.

Pancreas: Unremarkable.

Spleen: Unremarkable.

Adrenals/Urinary Tract: Negative adrenals. No hydronephrosis or
stone. Unremarkable bladder.

Stomach/Bowel: Significantly improved inflammation at the proximal
colon, near the ileocecal valve. There is now mesenteric soft tissue
density with internal fat signal. No residual extraluminal gas or
drainable fluid collection. Thickening of the colon wall and
terminal ileum is improved. No detected diverticulum in this region,
although there are distal colonic diverticula. The appendix is
separate from the inflammation and normal-appearing. Decreasing size
of ileocolic nodes.

Vascular/Lymphatic: No acute vascular abnormality. No mass or
adenopathy.

Reproductive:IUD in good position. Normal appearance of the ovaries.

Other: No ascites or pneumoperitoneum.

Musculoskeletal: No acute or aggressive finding. Mild sclerosis
around the sacroiliac joints without visible erosions.
IMPRESSION: 1. Improved perforation related inflammation in the ileocolic
mesentery, now phlegmon without extraluminal gas or drainable
collection. A diverticulum is not identified at the perforation site
and cause of perforation is indeterminate. When deemed safe,
colonoscopy correlation is recommended. Terminal ileum thickening is
improved, no definite underlying ileitis.
2. Distal colonic diverticulosis.

## 2016-06-14 MED ORDER — IOPAMIDOL (ISOVUE-300) INJECTION 61%
100.0000 mL | Freq: Once | INTRAVENOUS | Status: AC | PRN
  Administered 2016-06-14: 100 mL via INTRAVENOUS

## 2016-06-14 NOTE — Telephone Encounter (Signed)
-----   Message from Earline Mayotte, MD sent at 06/14/2016 11:36 AM EDT ----- Please notify the patient if the CT is markedly improved. I like to see her sometime next week for follow-up. Thank you

## 2016-06-14 NOTE — Telephone Encounter (Signed)
Notified patient as instructed, patient pleased. Discussed follow-up appointments, patient agrees  

## 2016-06-15 ENCOUNTER — Encounter: Payer: Self-pay | Admitting: General Surgery

## 2016-06-19 ENCOUNTER — Ambulatory Visit (INDEPENDENT_AMBULATORY_CARE_PROVIDER_SITE_OTHER): Payer: BLUE CROSS/BLUE SHIELD | Admitting: General Surgery

## 2016-06-19 ENCOUNTER — Encounter: Payer: Self-pay | Admitting: General Surgery

## 2016-06-19 VITALS — BP 120/84 | HR 72 | Resp 12 | Ht 64.0 in | Wt 196.0 lb

## 2016-06-19 DIAGNOSIS — K5732 Diverticulitis of large intestine without perforation or abscess without bleeding: Secondary | ICD-10-CM | POA: Diagnosis not present

## 2016-06-19 NOTE — Patient Instructions (Signed)
Plan Colonoscopy in August.

## 2016-06-19 NOTE — Progress Notes (Signed)
Patient ID: [name], female   DOB: Mar 30, 1975, 41 y.o.   MRN: [id number]  Chief Complaint  Patient presents with  . Abdominal Pain    CT scan    HPI [name] is a 41 y.o. female.  Here today for follow up abdominal pain and CT scan done 06-14-16. No abdominal pain, only tenderness and discomfort that comes and goes but over all better. She states she has low energy level. She started the Wellbutrin with no side effects.  HPI  Past Medical History:  Diagnosis Date  . Diverticulosis 2018    Past Surgical History:  Procedure Laterality Date  . EXCISIONAL HEMORRHOIDECTOMY  2014    Family History  Problem Relation Age of Onset  . Heart disease Mother     Unsure    Social History Social History  Substance Use Topics  . Smoking status: Former Smoker    Years: 14.00    Types: Cigarettes  . Smokeless tobacco: Never Used  . Alcohol use Yes     Comment: occasional     No Known Allergies  Current Outpatient Prescriptions  Medication Sig Dispense Refill  . buPROPion (WELLBUTRIN XL) 150 MG 24 hr tablet Take 150 mg by mouth daily.    Marland Kitchen levonorgestrel (MIRENA) 20 MCG/24HR IUD 1 each by Intrauterine route once.     No current facility-administered medications for this visit.     Review of Systems Review of Systems  Constitutional: Negative.   Respiratory: Negative.   Cardiovascular: Negative.   Gastrointestinal: Positive for abdominal pain.    Blood pressure 120/84, pulse 72, resp. rate 12, height  (1.626 m), weight 196 lb (88.9 kg).  Physical Exam Physical Exam  Constitutional: She is oriented to person, place, and time. She appears well-developed and well-nourished.  HENT:  Mouth/Throat: Oropharynx is clear and moist.  Eyes: Conjunctivae are normal. No scleral icterus.  Neck: Neck supple.  Cardiovascular: Normal rate, regular rhythm and normal heart sounds.   Pulmonary/Chest: Effort normal and breath sounds normal.  Abdominal: Soft.  Normal appearance and bowel sounds are normal. There is no tenderness.    Lymphadenopathy:    She has no cervical adenopathy.  Neurological: She is alert and oriented to person, place, and time.  Skin: Skin is warm and dry.  Psychiatric: Her behavior is normal.    Data Reviewed Repeat CT of the abdomen and pelvis dated 06/14/2016 was reviewed. Tremendous resolution of the inflammatory process previously noted in the right lower quadrant. The appendix fills with contrast completely.  Assessment    Resolving right-sided diverticulitis.    Plan    Considering how uncommon right side diverticulitis is, we'll plan for a colonoscopy in 3 months. By this time, any residual inflammatory process should have resolved.    Plan Colonoscopy in August.  Follow up in 3 months.   HPI, Physical Exam, Assessment and Plan have been scribed under the direction and in the presence of Earline Mayotte, MD.  Dorathy Daft, RN   Earline Mayotte 06/19/2016, 1:01 PM

## 2016-07-12 DIAGNOSIS — R51 Headache: Secondary | ICD-10-CM | POA: Diagnosis not present

## 2016-07-12 DIAGNOSIS — M5414 Radiculopathy, thoracic region: Secondary | ICD-10-CM | POA: Diagnosis not present

## 2016-07-12 DIAGNOSIS — M9901 Segmental and somatic dysfunction of cervical region: Secondary | ICD-10-CM | POA: Diagnosis not present

## 2016-07-12 DIAGNOSIS — M9902 Segmental and somatic dysfunction of thoracic region: Secondary | ICD-10-CM | POA: Diagnosis not present

## 2016-07-26 DIAGNOSIS — F331 Major depressive disorder, recurrent, moderate: Secondary | ICD-10-CM | POA: Diagnosis not present

## 2016-08-30 ENCOUNTER — Emergency Department
Admission: EM | Admit: 2016-08-30 | Discharge: 2016-08-30 | Disposition: A | Discharge status: Home / Self Care | Payer: BLUE CROSS/BLUE SHIELD | Attending: Emergency Medicine | Admitting: Emergency Medicine

## 2016-08-30 ENCOUNTER — Emergency Department: Payer: BLUE CROSS/BLUE SHIELD

## 2016-08-30 ENCOUNTER — Encounter: Payer: Self-pay | Admitting: Emergency Medicine

## 2016-08-30 DIAGNOSIS — I10 Essential (primary) hypertension: Secondary | ICD-10-CM | POA: Diagnosis not present

## 2016-08-30 DIAGNOSIS — Z79899 Other long term (current) drug therapy: Secondary | ICD-10-CM | POA: Insufficient documentation

## 2016-08-30 DIAGNOSIS — M7989 Other specified soft tissue disorders: Secondary | ICD-10-CM | POA: Diagnosis not present

## 2016-08-30 DIAGNOSIS — M25571 Pain in right ankle and joints of right foot: Secondary | ICD-10-CM | POA: Diagnosis not present

## 2016-08-30 DIAGNOSIS — S99911A Unspecified injury of right ankle, initial encounter: Secondary | ICD-10-CM | POA: Diagnosis not present

## 2016-08-30 DIAGNOSIS — Y939 Activity, unspecified: Secondary | ICD-10-CM | POA: Insufficient documentation

## 2016-08-30 DIAGNOSIS — Y998 Other external cause status: Secondary | ICD-10-CM | POA: Insufficient documentation

## 2016-08-30 DIAGNOSIS — S93401A Sprain of unspecified ligament of right ankle, initial encounter: Secondary | ICD-10-CM | POA: Diagnosis not present

## 2016-08-30 DIAGNOSIS — X509XXA Other and unspecified overexertion or strenuous movements or postures, initial encounter: Secondary | ICD-10-CM | POA: Insufficient documentation

## 2016-08-30 DIAGNOSIS — Y9239 Other specified sports and athletic area as the place of occurrence of the external cause: Secondary | ICD-10-CM | POA: Insufficient documentation

## 2016-08-30 DIAGNOSIS — Z87891 Personal history of nicotine dependence: Secondary | ICD-10-CM | POA: Insufficient documentation

## 2016-08-30 IMAGING — DX DG ANKLE COMPLETE 3+V*R*
3 series · 3 of 3 positions shown · non-contrast
Comparison: None.

CLINICAL DATA: Rolled right ankle.  Lateral pain.

EXAM:
RIGHT ANKLE - COMPLETE 3+ VIEW

[ankle ap]
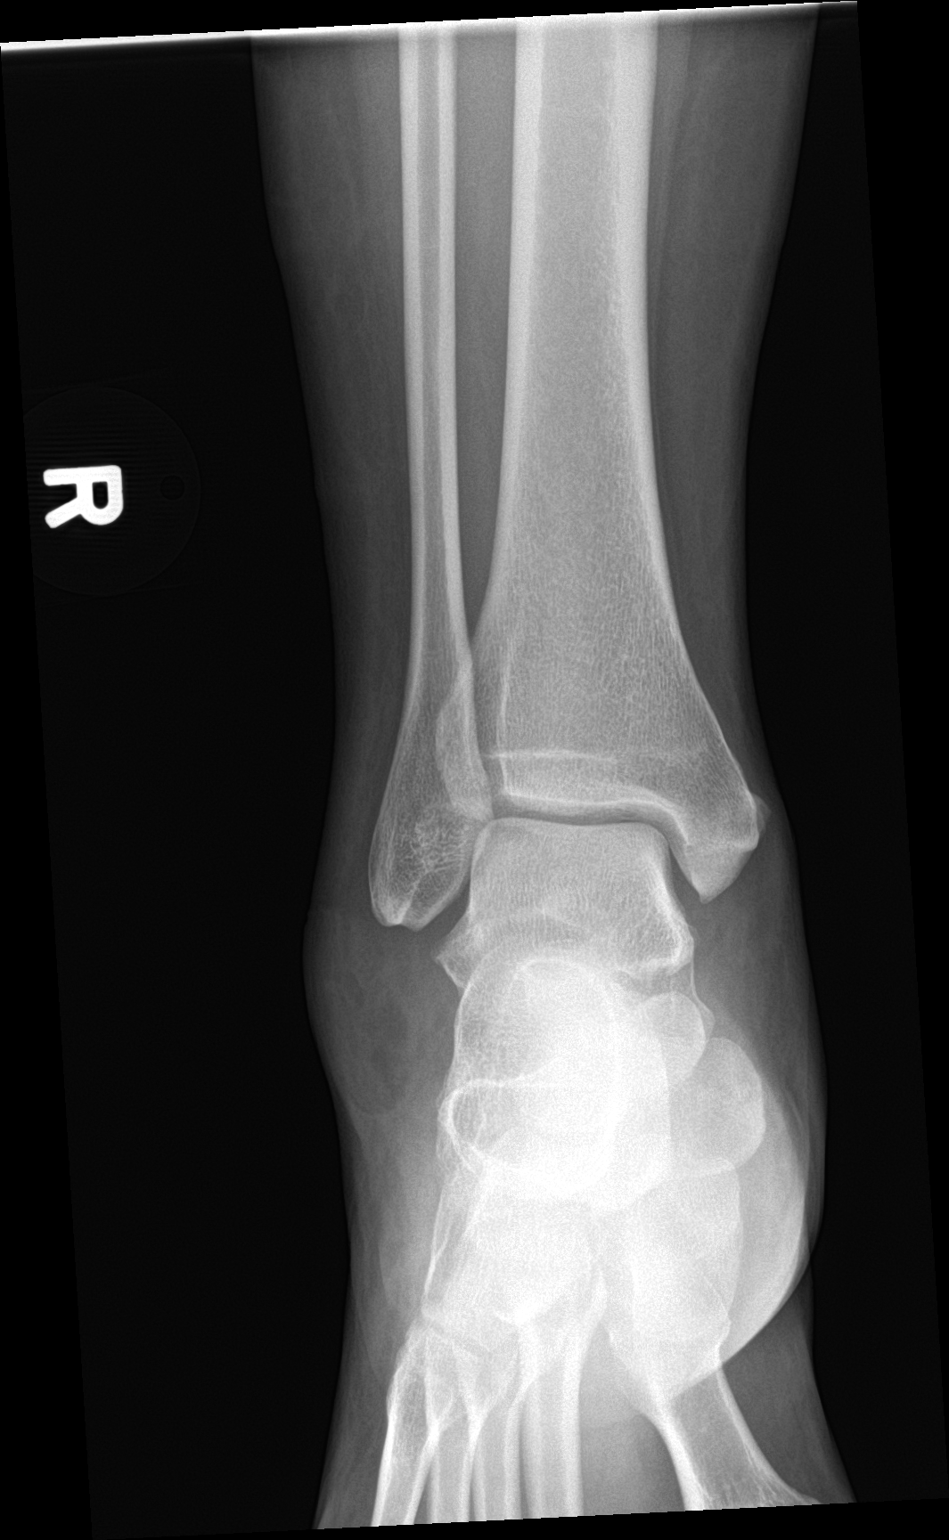

[ankle obl]
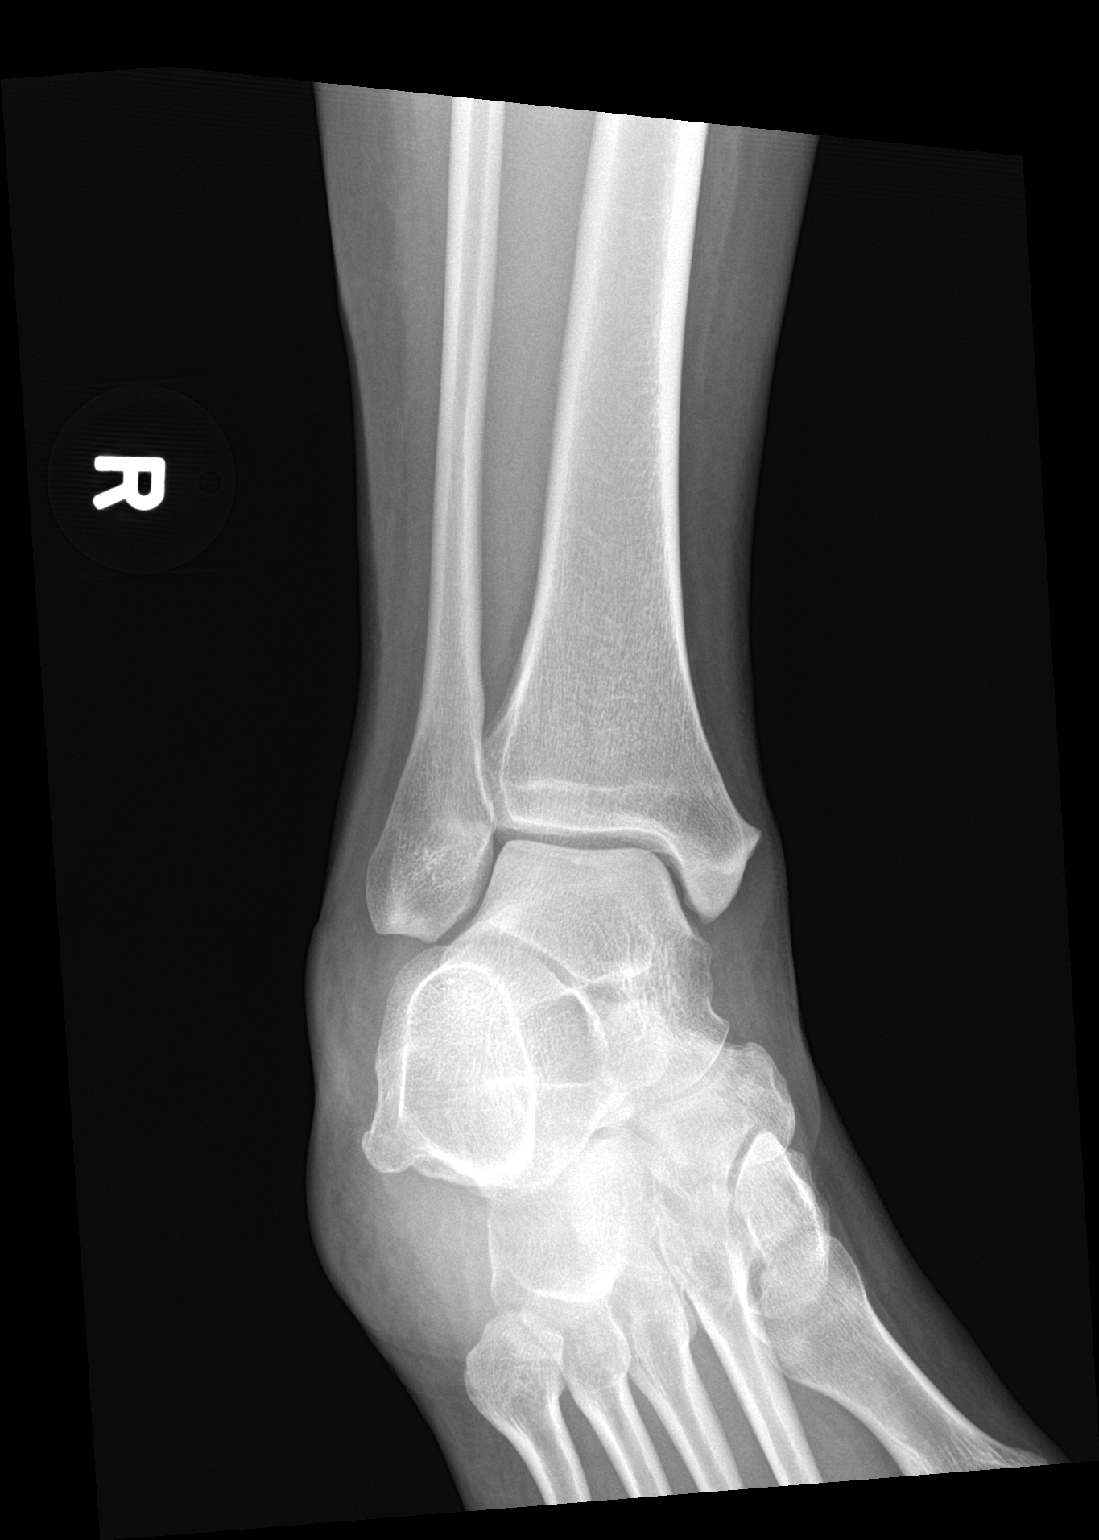

[ankle lat]
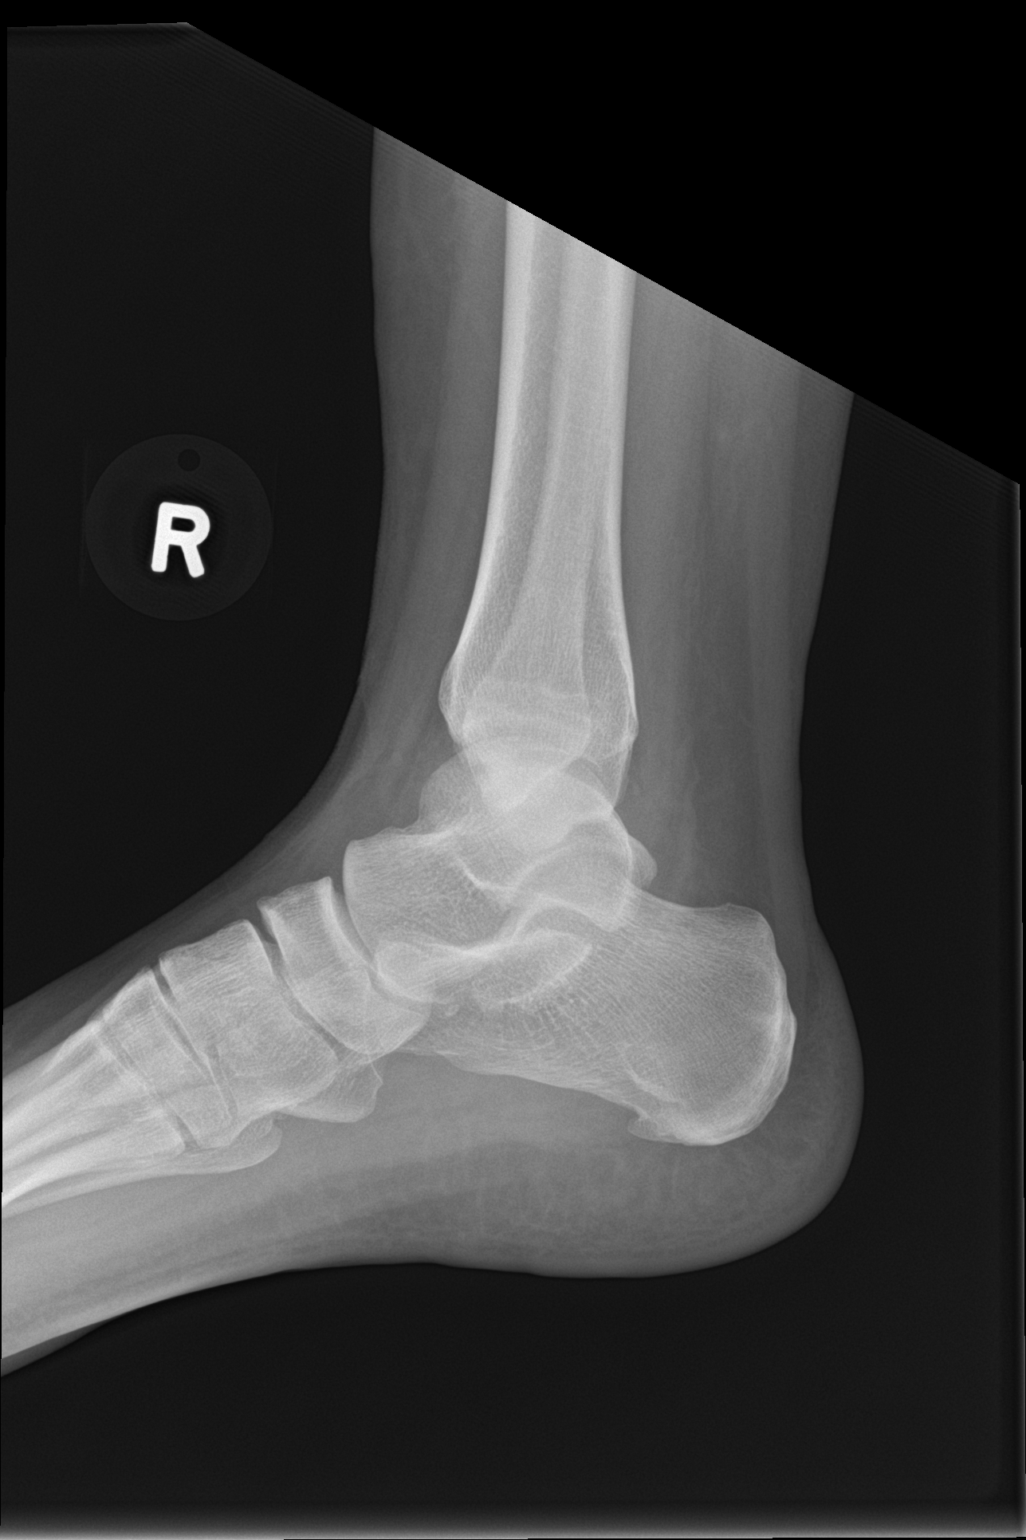

[3 of 3 positions shown; findings below may reference images not displayed]

FINDINGS: Lateral soft tissue swelling. No fracture, subluxation or
dislocation. Joint spaces are maintained. Small plantar calcaneal
spur.
IMPRESSION: No acute bony abnormality.  Lateral soft tissue swelling.

## 2016-08-30 MED ORDER — IBUPROFEN 600 MG PO TABS
600.0000 mg | ORAL_TABLET | Freq: Once | ORAL | Status: AC
Start: 2016-08-30 — End: 2016-08-30
  Administered 2016-08-30: 600 mg via ORAL
  Filled 2016-08-30: qty 1

## 2016-08-30 MED ORDER — HYDROCODONE-ACETAMINOPHEN 5-325 MG PO TABS
2.0000 | ORAL_TABLET | Freq: Once | ORAL | Status: AC
Start: 2016-08-30 — End: 2016-08-30
  Administered 2016-08-30: 2 via ORAL
  Filled 2016-08-30: qty 2

## 2016-08-30 MED ORDER — IBUPROFEN 600 MG PO TABS: 600.0000 mg | ORAL_TABLET | Freq: Three times a day (TID) | ORAL | 0 refills | Status: DC | PRN

## 2016-08-30 MED ORDER — HYDROCODONE-ACETAMINOPHEN 5-325 MG PO TABS: 1.0000 | ORAL_TABLET | ORAL | 0 refills | Status: DC | PRN

## 2016-08-30 NOTE — ED Triage Notes (Signed)
Patient states she "rolled" her right ankle last PM at the gym.  Pain10/10 when standing, 2/10 with sitting. Ace wrap placed by patient.  + pulse, can feel touch and can move toes.

## 2016-08-30 NOTE — ED Provider Notes (Signed)
[facility] Emergency Department Provider Note  ____________________________________________   First MD Initiated Contact with Patient 08/30/16 1008     (approximate)  I have reviewed the triage vital signs and the nursing notes.   HISTORY  Chief Complaint Ankle Pain    HPI [name] is a 41 y.o. female is here complaining of right ankle pain after she rolled her ankle last night in the gym. Patient states that she was unable to put any weight on it last evening and had to be driven home. This morning pain has increased along with swelling and she is unable to apply weight without severe pain. Patient took Tylenol this morning prior to arrival with minimal relief. She denies any previous injury to her ankle. She rates her pain as a 10 over 10.   Past Medical History:  Diagnosis Date  . Diverticulosis 2018    Patient Active Problem List   Diagnosis Date Noted  . Diverticulitis of colon 06/19/2016  . Hypertension 02/10/2016    Past Surgical History:  Procedure Laterality Date  . EXCISIONAL HEMORRHOIDECTOMY  2014    Prior to Admission medications   Medication Sig Start Date End Date Taking? Authorizing Provider  buPROPion (WELLBUTRIN XL) 150 MG 24 hr tablet Take 150 mg by mouth daily.    [provider]  HYDROcodone-acetaminophen (NORCO/VICODIN) 5-325 MG tablet Take 1 tablet by mouth every 4 (four) hours as needed for moderate pain. 08/30/16   Tommi RumpsSummers, Tyeesha Riker L, PA-C  ibuprofen (ADVIL,MOTRIN) 600 MG tablet Take 1 tablet (600 mg total) by mouth every 8 (eight) hours as needed. 08/30/16   Tommi RumpsSummers, Hanalei Glace L, PA-C  levonorgestrel (MIRENA) 20 MCG/24HR IUD 1 each by Intrauterine route once.    [provider]    Allergies Patient has no known allergies.  Family History  Problem Relation Age of Onset  . Heart disease Mother        Unsure    Social History Social History  Substance Use Topics  . Smoking status: Former  Smoker    Years: 14.00    Types: Cigarettes  . Smokeless tobacco: Never Used  . Alcohol use Yes     Comment: occasional     Review of Systems Constitutional: No fever/chills Cardiovascular: Denies chest pain. Respiratory: Denies shortness of breath. Gastrointestinal: No abdominal pain.  No nausea, no vomiting.   Musculoskeletal: Positive right ankle pain. Skin: Negative for rash. Neurological: Negative for headaches, focal weakness or numbness.  ____________________________________________   PHYSICAL EXAM:  VITAL SIGNS: ED Triage Vitals [08/30/16 0952]  Enc Vitals Group     BP 124/90     Pulse Rate 89     Resp 18     Temp 98.4 F (36.9 C)     Temp Source Oral     SpO2 100 %     Weight 195 lb (88.5 kg)     Height 5\' 4"  (1.626 m)     Head Circumference      Peak Flow      Pain Score      Pain Loc      Pain Edu?      Excl. in GC?     Constitutional: Alert and oriented. Well appearing and in no acute distress. Eyes: Conjunctivae are normal. PERRL. EOMI. Head: Atraumatic. Neck: No stridor.   Cardiovascular: Normal rate, regular rhythm. Grossly normal heart sounds.  Good peripheral circulation. Respiratory: Normal respiratory effort.  No retractions. Lungs CTAB. Musculoskeletal:Examination of the right ankle  reveals moderate soft tissue swelling on the lateral aspect. Range of motion is minimally restricted in flexion and extension. Patient moved sensory function intact distal to the injury. Skin is intact. Capillary refill is less than 3 seconds. Neurologic:  Normal speech and language. No gross focal neurologic deficits are appreciated.  Skin:  Skin is warm, dry and intact. No rash noted. Psychiatric: Mood and affect are normal. Speech and behavior are normal.  ____________________________________________   LABS (all labs ordered are listed, but only abnormal results are displayed)  Labs Reviewed - No data to display  RADIOLOGY  Dg Ankle Complete  Right  Result Date: 08/30/2016 CLINICAL DATA:  Rolled right ankle.  Lateral pain. EXAM: RIGHT ANKLE - COMPLETE 3+ VIEW COMPARISON:  None. FINDINGS: Lateral soft tissue swelling. No fracture, subluxation or dislocation. Joint spaces are maintained. Small plantar calcaneal spur. IMPRESSION: No acute bony abnormality.  Lateral soft tissue swelling. Electronically Signed   By: Charlett Nose M.D.   On: 08/30/2016 10:27    ____________________________________________   PROCEDURES  Procedure(s) performed: None  Procedures  Critical Care performed: No  ____________________________________________   INITIAL IMPRESSION / ASSESSMENT AND PLAN / ED COURSE  Pertinent labs & imaging results that were available during my care of the patient were reviewed by me and considered in my medical decision making (see chart for details).  Patient was placed in a ankle stirrup splint and given crutches. She is to ice and elevate as needed for swelling. She was given ibuprofen and Norco while in the emergency room for pain. She was discharged with a prescription for the same. She is to follow-up with Dr. Ether Griffins if any continued problems with her ankle.    ___________________________________________   FINAL CLINICAL IMPRESSION(S) / ED DIAGNOSES  Final diagnoses:  Sprain of right ankle, unspecified ligament, initial encounter      NEW MEDICATIONS STARTED DURING THIS VISIT:  New Prescriptions   HYDROCODONE-ACETAMINOPHEN (NORCO/VICODIN) 5-325 MG TABLET    Take 1 tablet by mouth every 4 (four) hours as needed for moderate pain.   IBUPROFEN (ADVIL,MOTRIN) 600 MG TABLET    Take 1 tablet (600 mg total) by mouth every 8 (eight) hours as needed.     Note:  This document was prepared using Dragon voice recognition software and may include unintentional dictation errors.    Tommi Rumps, PA-C 08/30/16 1043    Merrily Brittle, MD 09/03/16 1105

## 2016-08-30 NOTE — Discharge Instructions (Signed)
Ice and elevate as needed for swelling and pain. Ibuprofen 3 times a day with food. Norco as needed for severe pain. He is crutches for weightbearing as needed. Ankle splint for support and protection. Follow-up with Dr. Ether GriffinsFowler if any severe worsening of your symptoms. He now also follow-up with your primary care provider if any additional pain medication as needed.

## 2016-08-30 NOTE — ED Notes (Signed)
See triage note  States she rolled her right ankle last pm Min swelling noted positive pulses   Unable to bear full wt

## 2016-09-06 DIAGNOSIS — M9902 Segmental and somatic dysfunction of thoracic region: Secondary | ICD-10-CM | POA: Diagnosis not present

## 2016-09-06 DIAGNOSIS — R51 Headache: Secondary | ICD-10-CM | POA: Diagnosis not present

## 2016-09-06 DIAGNOSIS — F411 Generalized anxiety disorder: Secondary | ICD-10-CM | POA: Diagnosis not present

## 2016-09-06 DIAGNOSIS — M9901 Segmental and somatic dysfunction of cervical region: Secondary | ICD-10-CM | POA: Diagnosis not present

## 2016-09-06 DIAGNOSIS — M5414 Radiculopathy, thoracic region: Secondary | ICD-10-CM | POA: Diagnosis not present

## 2016-09-13 DIAGNOSIS — F411 Generalized anxiety disorder: Secondary | ICD-10-CM | POA: Diagnosis not present

## 2016-09-20 DIAGNOSIS — F331 Major depressive disorder, recurrent, moderate: Secondary | ICD-10-CM | POA: Diagnosis not present

## 2016-09-27 ENCOUNTER — Encounter: Payer: Self-pay | Admitting: General Surgery

## 2016-09-27 ENCOUNTER — Ambulatory Visit (INDEPENDENT_AMBULATORY_CARE_PROVIDER_SITE_OTHER): Payer: BLUE CROSS/BLUE SHIELD | Admitting: General Surgery

## 2016-09-27 VITALS — BP 130/78 | HR 94 | Resp 12 | Ht 64.0 in | Wt 189.0 lb

## 2016-09-27 DIAGNOSIS — K5732 Diverticulitis of large intestine without perforation or abscess without bleeding: Secondary | ICD-10-CM

## 2016-09-27 DIAGNOSIS — F411 Generalized anxiety disorder: Secondary | ICD-10-CM | POA: Diagnosis not present

## 2016-09-27 NOTE — Progress Notes (Signed)
Patient ID: [name], female   DOB: 1975-12-25, 41 y.o.   MRN: [id number]  Chief Complaint  Patient presents with  . Follow-up    HPI [name] is a 41 y.o. female.  Here for 3 month follow up diverticulitis and colonoscopy discussion. Denies any GI issues. She twisted her right ankle a few weeks ago, but its improving. She is a Veterinary surgeon.   HPI  Past Medical History:  Diagnosis Date  . Diverticulosis 2018    Past Surgical History:  Procedure Laterality Date  . EXCISIONAL HEMORRHOIDECTOMY  2014    Family History  Problem Relation Age of Onset  . Heart disease Mother        Unsure    Social History Social History  Substance Use Topics  . Smoking status: Former Smoker    Years: 14.00    Types: Cigarettes  . Smokeless tobacco: Never Used  . Alcohol use Yes     Comment: occasional     No Known Allergies  Current Outpatient Prescriptions  Medication Sig Dispense Refill  . buPROPion (WELLBUTRIN XL) 300 MG 24 hr tablet Take 300 mg by mouth every morning.  0  . ibuprofen (ADVIL,MOTRIN) 600 MG tablet Take 1 tablet (600 mg total) by mouth every 8 (eight) hours as needed. 30 tablet 0  . levonorgestrel (MIRENA) 20 MCG/24HR IUD 1 each by Intrauterine route once.    . zaleplon (SONATA) 10 MG capsule take 1 capsule by mouth at bedtime if needed for sleep and may re...  (REFER TO PRESCRIPTION NOTES).  0  . traZODone (DESYREL) 50 MG tablet take 1/2 to 1 tablet by mouth at bedtime if needed for sleep  0   No current facility-administered medications for this visit.     Review of Systems Review of Systems  Constitutional: Negative.   Respiratory: Negative.   Cardiovascular: Negative.   Gastrointestinal: Negative for constipation, diarrhea, nausea and vomiting.    Blood pressure 130/78, pulse 94, resp. rate 12, height 5\' 4"  (1.626 m), weight 189 lb (85.7 kg).  Physical Exam Physical Exam  Constitutional: She is oriented to person, place, and time.  She appears well-developed and well-nourished.  HENT:  Mouth/Throat: Oropharynx is clear and moist.  Eyes: Conjunctivae are normal. No scleral icterus.  Neck: Neck supple.  Cardiovascular: Normal rate and regular rhythm.   Murmur heard.  Systolic murmur is present with a grade of 2/6  Pulmonary/Chest: Effort normal and breath sounds normal.  Abdominal: Soft. Normal appearance and bowel sounds are normal. There is no tenderness.  Lymphadenopathy:    She has no cervical adenopathy.  Neurological: She is alert and oriented to person, place, and time.  Skin: Skin is warm and dry.  Psychiatric: Her behavior is normal.    Data Reviewed 06/14/2016 follow-up CT  1. Improved perforation related inflammation in the ileocolic mesentery, now phlegmon without extraluminal gas or drainable collection. A diverticulum is not identified at the perforation site and cause of perforation is indeterminate. When deemed safe, colonoscopy correlation is recommended. Terminal ileum thickening is improved, no definite underlying ileitis. 2. Distal colonic diverticulosis.   Assessment    Resolution of diverticulitis symptoms.    Plan    It's difficult to explain the uncommon presentation with perforation of right colon diverticuli an otherwise healthy individual. I recommended that she have a colonoscopy somewhat earlier than the average age recommended at this time for patient such as herself (46) . I don't believe this will change the total number  of colonoscopy she'll require over a lifetime.    Colonoscopy with possible biopsy/polypectomy prn: Information regarding the procedure, including its potential risks and complications (including but not limited to perforation of the bowel, which may require emergency surgery to repair, and bleeding) was verbally given to the patient. Educational information regarding lower intestinal endoscopy was given to the patient. Written instructions for how to  complete the bowel prep using Miralax were provided. The importance of drinking ample fluids to avoid dehydration as a result of the prep emphasized.    HPI, Physical Exam, Assessment and Plan have been scribed under the direction and in the presence of Earline MayotteJeffrey W. Malashia Kamaka, MD. Dorathy DaftMarsha Hatch, RN   Earline MayotteByrnett, Elmor Kost W 09/28/2016, 7:54 PM   Patient wishes to be contacted once the October schedule is available. Miralax prescription will be sent to patient's pharmacy once colonoscopy date is arranged. Colonoscopy instructions have been reviewed and provided with the patient today. This patient is aware to call the office if they have further questions.   Nicholes MangoMichele J. Bailey, CMA

## 2016-09-27 NOTE — Patient Instructions (Signed)
The patient is aware to call back for any questions or concerns.  Colonoscopy, Adult A colonoscopy is an exam to look at the entire large intestine. During the exam, a lubricated, bendable tube is inserted into the anus and then passed into the rectum, colon, and other parts of the large intestine. A colonoscopy is often done as a part of normal colorectal screening or in response to certain symptoms, such as anemia, persistent diarrhea, abdominal pain, and blood in the stool. The exam can help screen for and diagnose medical problems, including:  Tumors.  Polyps.  Inflammation.  Areas of bleeding.  Tell a health care provider about:  Any allergies you have.  All medicines you are taking, including vitamins, herbs, eye drops, creams, and over-the-counter medicines.  Any problems you or family members have had with anesthetic medicines.  Any blood disorders you have.  Any surgeries you have had.  Any medical conditions you have.  Any problems you have had passing stool. What are the risks? Generally, this is a safe procedure. However, problems may occur, including:  Bleeding.  A tear in the intestine.  A reaction to medicines given during the exam.  Infection (rare).  What happens before the procedure? Eating and drinking restrictions Follow instructions from your health care provider about eating and drinking, which may include:  A few days before the procedure - follow a low-fiber diet. Avoid nuts, seeds, dried fruit, raw fruits, and vegetables.  1-3 days before the procedure - follow a clear liquid diet. Drink only clear liquids, such as clear broth or bouillon, black coffee or tea, clear juice, clear soft drinks or sports drinks, gelatin dessert, and popsicles. Avoid any liquids that contain red or purple dye.  On the day of the procedure - do not eat or drink anything during the 2 hours before the procedure, or within the time period that your health care provider  recommends.  Bowel prep If you were prescribed an oral bowel prep to clean out your colon:  Take it as told by your health care provider. Starting the day before your procedure, you will need to drink a large amount of medicated liquid. The liquid will cause you to have multiple loose stools until your stool is almost clear or light green.  If your skin or anus gets irritated from diarrhea, you may use these to relieve the irritation: ? Medicated wipes, such as adult wet wipes with aloe and vitamin E. ? A skin soothing-product like petroleum jelly.  If you vomit while drinking the bowel prep, take a break for up to 60 minutes and then begin the bowel prep again. If vomiting continues and you cannot take the bowel prep without vomiting, call your health care provider.  General instructions  Ask your health care provider about changing or stopping your regular medicines. This is especially important if you are taking diabetes medicines or blood thinners.  Plan to have someone take you home from the hospital or clinic. What happens during the procedure?  An IV tube may be inserted into one of your veins.  You will be given medicine to help you relax (sedative).  To reduce your risk of infection: ? Your health care team will wash or sanitize their hands. ? Your anal area will be washed with soap.  You will be asked to lie on your side with your knees bent.  Your health care provider will lubricate a long, thin, flexible tube. The tube will have a camera and   a light on the end.  The tube will be inserted into your anus.  The tube will be gently eased through your rectum and colon.  Air will be delivered into your colon to keep it open. You may feel some pressure or cramping.  The camera will be used to take images during the procedure.  A small tissue sample may be removed from your body to be examined under a microscope (biopsy). If any potential problems are found, the tissue  will be sent to a lab for testing.  If small polyps are found, your health care provider may remove them and have them checked for cancer cells.  The tube that was inserted into your anus will be slowly removed. The procedure may vary among health care providers and hospitals. What happens after the procedure?  Your blood pressure, heart rate, breathing rate, and blood oxygen level will be monitored until the medicines you were given have worn off.  Do not drive for 24 hours after the exam.  You may have a small amount of blood in your stool.  You may pass gas and have mild abdominal cramping or bloating due to the air that was used to inflate your colon during the exam.  It is up to you to get the results of your procedure. Ask your health care provider, or the department performing the procedure, when your results will be ready. This information is not intended to replace advice given to you by your health care provider. Make sure you discuss any questions you have with your health care provider. Document Released: 02/03/2000 Document Revised: 12/07/2015 Document Reviewed: 04/19/2015 Elsevier Interactive Patient Education  2018 [employer]  

## 2016-10-08 ENCOUNTER — Telehealth: Payer: Self-pay | Admitting: *Deleted

## 2016-10-08 NOTE — Telephone Encounter (Signed)
Tried to reach patient on cell phone but no answer and not able to leave a message due to a full mailbox.   Just wanted to let patient know that we have the October schedule available and can arrange a date for a colonoscopy.   Miralax prescription will need to be sent in once date arranged.

## 2016-10-09 ENCOUNTER — Other Ambulatory Visit: Payer: Self-pay | Admitting: General Surgery

## 2016-10-09 DIAGNOSIS — Z1211 Encounter for screening for malignant neoplasm of colon: Secondary | ICD-10-CM

## 2016-10-09 MED ORDER — POLYETHYLENE GLYCOL 3350 17 GM/SCOOP PO POWD: 1.0000 | Freq: Once | ORAL | 0 refills | Status: AC

## 2016-10-09 NOTE — Telephone Encounter (Signed)
Patient called back to schedule her colonoscopy. The patient is scheduled for a Colonoscopy at [facility] on 11/28/16. They are aware to call the day before to get their arrival time. Miralax prescription has been sent into the patient's pharmacy. The patient is aware of date and instructions.

## 2016-10-11 DIAGNOSIS — M9902 Segmental and somatic dysfunction of thoracic region: Secondary | ICD-10-CM | POA: Diagnosis not present

## 2016-10-11 DIAGNOSIS — M9901 Segmental and somatic dysfunction of cervical region: Secondary | ICD-10-CM | POA: Diagnosis not present

## 2016-10-11 DIAGNOSIS — F411 Generalized anxiety disorder: Secondary | ICD-10-CM | POA: Diagnosis not present

## 2016-10-11 DIAGNOSIS — M5414 Radiculopathy, thoracic region: Secondary | ICD-10-CM | POA: Diagnosis not present

## 2016-10-11 DIAGNOSIS — R51 Headache: Secondary | ICD-10-CM | POA: Diagnosis not present

## 2016-10-18 ENCOUNTER — Encounter: Payer: Self-pay | Admitting: Internal Medicine

## 2016-10-18 ENCOUNTER — Ambulatory Visit (INDEPENDENT_AMBULATORY_CARE_PROVIDER_SITE_OTHER)
Admission: RE | Admit: 2016-10-18 | Discharge: 2016-10-18 | Disposition: A | Discharge status: Home / Self Care | Payer: BLUE CROSS/BLUE SHIELD | Source: Ambulatory Visit | Attending: Internal Medicine | Admitting: Internal Medicine

## 2016-10-18 ENCOUNTER — Ambulatory Visit (INDEPENDENT_AMBULATORY_CARE_PROVIDER_SITE_OTHER): Payer: BLUE CROSS/BLUE SHIELD | Admitting: Internal Medicine

## 2016-10-18 VITALS — BP 126/84 | HR 80 | Temp 98.7°F | Wt 188.8 lb

## 2016-10-18 DIAGNOSIS — F5104 Psychophysiologic insomnia: Secondary | ICD-10-CM | POA: Diagnosis not present

## 2016-10-18 DIAGNOSIS — M79671 Pain in right foot: Secondary | ICD-10-CM | POA: Diagnosis not present

## 2016-10-18 DIAGNOSIS — R51 Headache: Secondary | ICD-10-CM | POA: Diagnosis not present

## 2016-10-18 DIAGNOSIS — F339 Major depressive disorder, recurrent, unspecified: Secondary | ICD-10-CM | POA: Diagnosis not present

## 2016-10-18 DIAGNOSIS — G4700 Insomnia, unspecified: Secondary | ICD-10-CM | POA: Insufficient documentation

## 2016-10-18 DIAGNOSIS — F411 Generalized anxiety disorder: Secondary | ICD-10-CM | POA: Diagnosis not present

## 2016-10-18 DIAGNOSIS — R519 Headache, unspecified: Secondary | ICD-10-CM

## 2016-10-18 IMAGING — DX DG FOOT COMPLETE 3+V*R*
3 series · 3 of 3 positions shown · non-contrast
Comparison: None.

CLINICAL DATA: Persistent right foot pain after an injury in [DATE]. Initial imaging encounter.

EXAM:
RIGHT FOOT COMPLETE - 3+ VIEW

[foot ap]
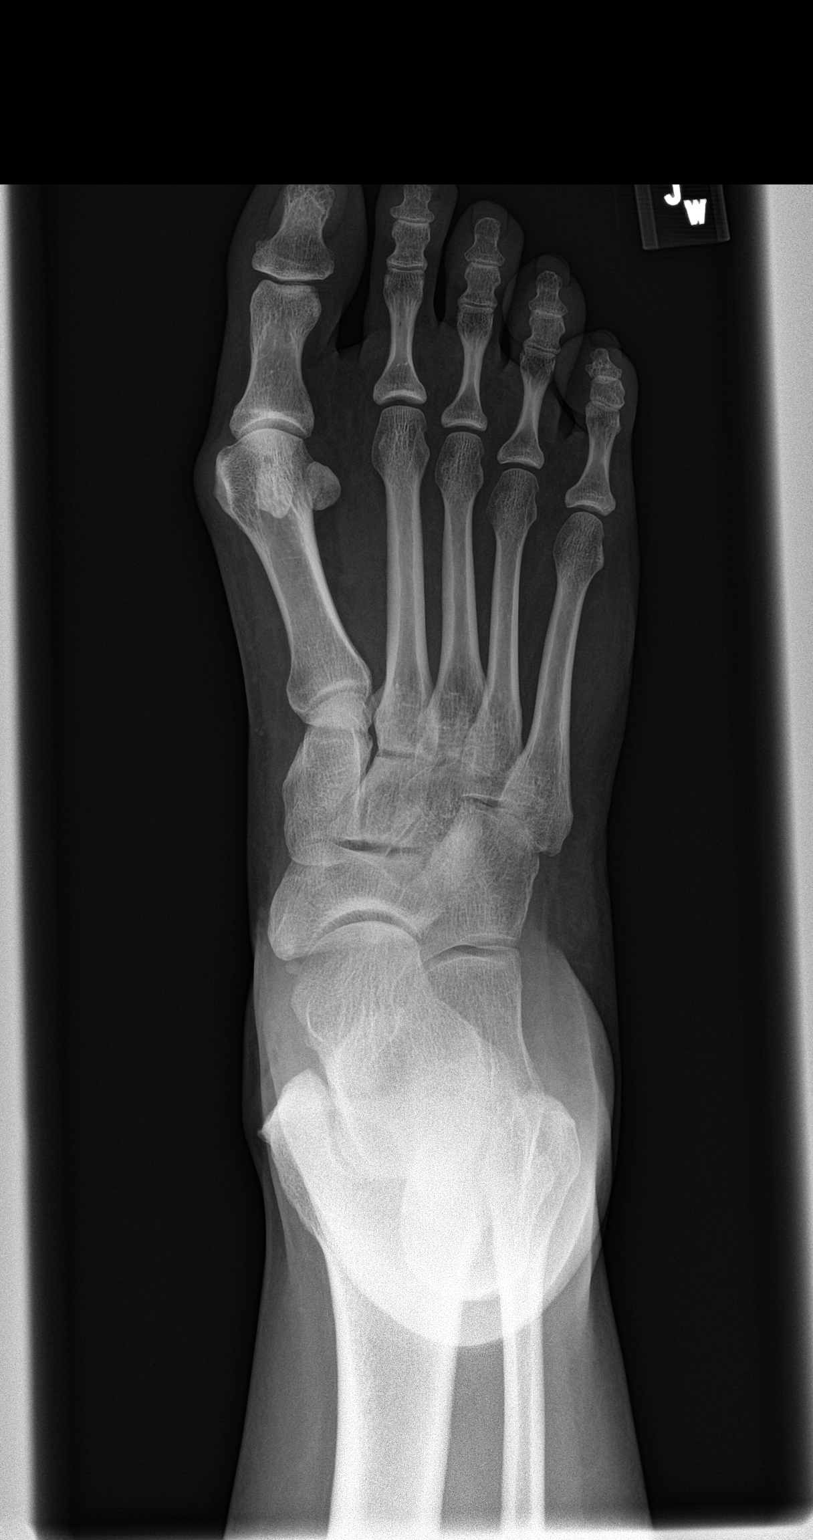

[foot obl]
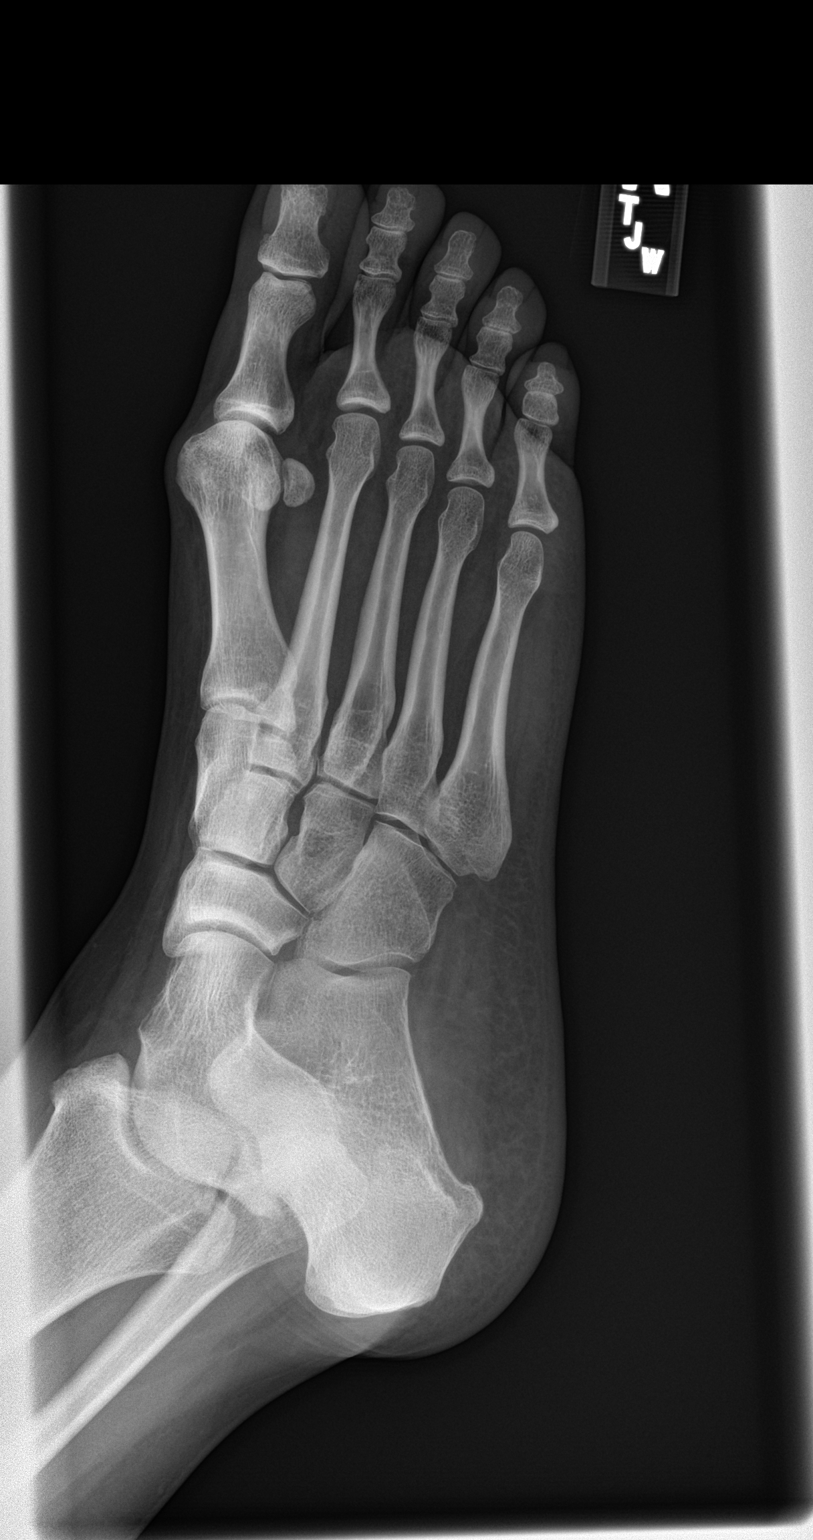

[foot lat]
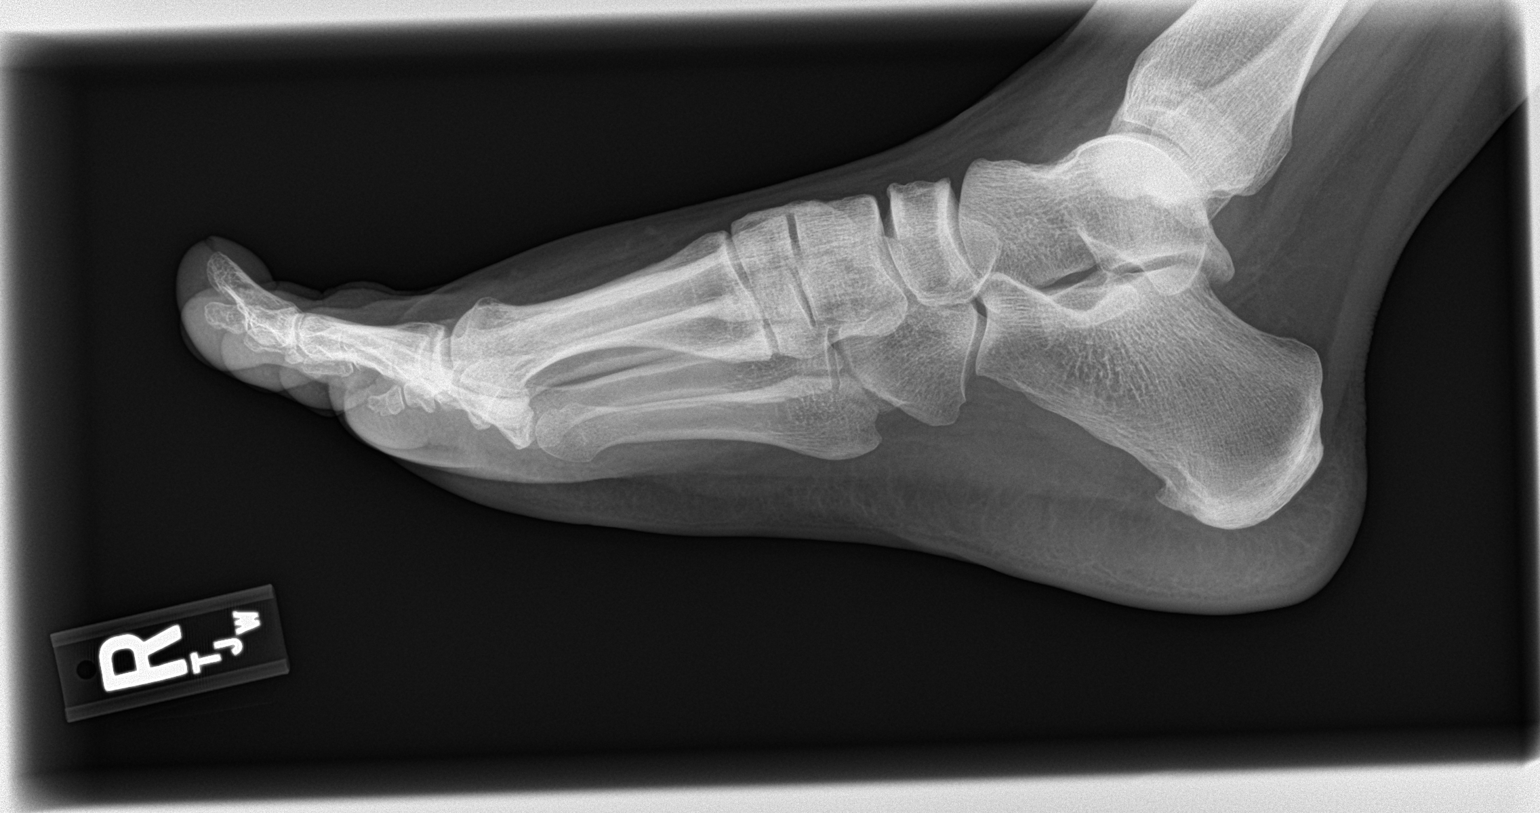

[3 of 3 positions shown; findings below may reference images not displayed]

FINDINGS: Mild hallux valgus. No evidence of acute, subacute or healed
fractures. Well-preserved bone mineral density. Well preserved joint
spaces. Very small plantar calcaneal spur.
IMPRESSION: No acute, subacute or significant osseous abnormality.

## 2016-10-18 MED ORDER — IBUPROFEN 600 MG PO TABS: 600.0000 mg | ORAL_TABLET | Freq: Three times a day (TID) | ORAL | 2 refills | Status: DC | PRN

## 2016-10-18 NOTE — Assessment & Plan Note (Signed)
Chronic but stable on Wellbutrin She will continue to follow with psych

## 2016-10-18 NOTE — Patient Instructions (Signed)
Ankle Sprain, Phase I Rehab Ask your health care provider which exercises are safe for you. Do exercises exactly as told by your health care provider and adjust them as directed. It is normal to feel mild stretching, pulling, tightness, or discomfort as you do these exercises, but you should stop right away if you feel sudden pain or your pain gets worse.Do not begin these exercises until told by your health care provider. Stretching and range of motion exercises These exercises warm up your muscles and joints and improve the movement and flexibility of your lower leg and ankle. These exercises also help to relieve pain and stiffness. Exercise A: Gastroc and soleus stretch  1. Sit on the floor with your left / right leg extended. 2. Loop a belt or towel around the ball of your left / right foot. The ball of your foot is on the walking surface, right under your toes. 3. Keep your left / right ankle and foot relaxed and keep your knee straight while you use the belt or towel to pull your foot toward you. You should feel a gentle stretch behind your calf or knee. 4. Hold this position for __________ seconds, then release to the starting position. Repeat the exercise with your knee bent. You can put a pillow or a rolled bath towel under your knee to support it. You should feel a stretch deep in your calf or at your Achilles tendon. Repeat each stretch __________ times. Complete these stretches __________ times a day. Exercise B: Ankle alphabet  1. Sit with your left / right leg supported at the lower leg. ? Do not rest your foot on anything. ? Make sure your foot has room to move freely. 2. Think of your left / right foot as a paintbrush, and move your foot to trace each letter of the alphabet in the air. Keep your hip and knee still while you trace. Make the letters as large as you can without feeling discomfort. 3. Trace every letter from A to Z. Repeat __________ times. Complete this exercise  __________ times a day. Strengthening exercises These exercises build strength and endurance in your ankle and lower leg. Endurance is the ability to use your muscles for a long time, even after they get tired. Exercise C: Dorsiflexors  1. Secure a rubber exercise band or tube to an object, such as a table leg, that will stay still when the band is pulled. Secure the other end around your left / right foot. 2. Sit on the floor facing the object, with your left / right leg extended. The band or tube should be slightly tense when your foot is relaxed. 3. Slowly bring your foot toward you, pulling the band tighter. 4. Hold this position for __________ seconds. 5. Slowly return your foot to the starting position. Repeat __________ times. Complete this exercise __________ times a day. Exercise D: Plantar flexors  1. Sit on the floor with your left / right leg extended. 2. Loop a rubber exercise tube or band around the ball of your left / right foot. The ball of your foot is on the walking surface, right under your toes. ? Hold the ends of the band or tube in your hands. ? The band or tube should be slightly tense when your foot is relaxed. 3. Slowly point your foot and toes downward, pushing them away from you. 4. Hold this position for __________ seconds. 5. Slowly return your foot to the starting position. Repeat __________ times. Complete   this exercise __________ times a day. Exercise E: Evertors 1. Sit on the floor with your legs straight out in front of you. 2. Loop a rubber exercise band or tube around the ball of your left / right foot. The ball of your foot is on the walking surface, right under your toes. ? Hold the ends of the band in your hands, or secure the band to a stable object. ? The band or tube should be slightly tense when your foot is relaxed. 3. Slowly push your foot outward, away from your other leg. 4. Hold this position for __________ seconds. 5. Slowly return your  foot to the starting position. Repeat __________ times. Complete this exercise __________ times a day. This information is not intended to replace advice given to you by your health care provider. Make sure you discuss any questions you have with your health care provider. Document Released: 09/06/2004 Document Revised: 10/13/2015 Document Reviewed: 12/20/2014 Elsevier Interactive Patient Education  2018 [employer]vier Inc.  

## 2016-10-18 NOTE — Assessment & Plan Note (Signed)
She will use Trazadone or Sonata as needed based on psych recommendations Will monitor

## 2016-10-18 NOTE — Progress Notes (Signed)
HPI  Pt presents to the clinic today to establish care and for management of the conditions listed below. She is transferring care from [facility].  Depression: Triggered by childhood trauma. She takes Wellbutrin daily as prescribed. She denies SI/HI. She follows with Dr. Claybon Jabs.  Insomnia: She has difficulty falling asleep and staying asleep. She has been prescribed Trazadone and Sonata, although she has not used either one of these. She reports she knows not to take the 2 together.  Frequent Headaches: These occur at least once per week, caused by old neck injury. She see a chiropractor as needed for adjustments with good relief.  She also c/o right foot pain. This started in July after she sprained her right ankle. She was evaluated in the ER for the same. Xray of the right ankle did not show any acute fracture. She is still have swelling and pain in the ankle as well as the foot. She has been wearing a brace and taken Ibuprofen as needed with some relief. She would like a refill of Ibuprofen today.  Flu: never Tetanus: unsure Pap Smear: 12/2015 Mammogram: 09/2013 at [facility] Colon Screening: scheduled 11/2016 Vision Screening: as needed Dentist: annually   Past Medical History:  Diagnosis Date  . Depression   . Diverticulosis 2018  . Frequent headaches   . Heart murmur   . History of chicken pox      Current Outpatient Prescriptions  Medication Sig Dispense Refill  . buPROPion (WELLBUTRIN XL) 300 MG 24 hr tablet Take 300 mg by mouth every morning.  0  . ibuprofen (ADVIL,MOTRIN) 600 MG tablet Take 1 tablet (600 mg total) by mouth every 8 (eight) hours as needed. 30 tablet 0  . levonorgestrel (MIRENA) 20 MCG/24HR IUD 1 each by Intrauterine route once.     . traZODone (DESYREL) 50 MG tablet take 1/2 to 1 tablet by mouth at bedtime if needed for sleep  0  . zaleplon (SONATA) 10 MG capsule take 1 capsule by mouth at bedtime if needed for sleep and may re...  (REFER TO  PRESCRIPTION NOTES).  0   No current facility-administered medications for this visit.     No Known Allergies  Family History  Problem Relation Age of Onset  . Heart disease Mother        Unsure    Social History   Social History  . Marital status: Divorced    Spouse name: N/A  . Number of children: N/A  . Years of education: N/A   Occupational History  . Not on file.   Social History Main Topics  . Smoking status: Former Smoker    Years: 14.00    Types: Cigarettes  . Smokeless tobacco: Never Used  . Alcohol use Yes     Comment: occasional   . Drug use: No  . Sexual activity: Not Currently    Birth control/ protection: None   Other Topics Concern  . Not on file   Social History Narrative  . No narrative on file    ROS:  Constitutional: Pt reports frequent headaches. Denies fever, malaise, fatigue, or abrupt weight changes.  HEENT: Denies eye pain, eye redness, ear pain, ringing in the ears, wax buildup, runny nose, nasal congestion, bloody nose, or sore throat. Respiratory: Denies difficulty breathing, shortness of breath, cough or sputum production.   Cardiovascular: Denies chest pain, chest tightness, palpitations or swelling in the hands or feet.  Gastrointestinal: Denies abdominal pain, bloating, constipation, diarrhea or blood in the stool.  GU: Denies frequency, urgency, pain with urination, blood in urine, odor or discharge. Musculoskeletal: Pt reports right ankle/foot pain and swelling. Denies decrease in range of motion, difficulty with gait, muscle pain.  Skin: Denies redness, rashes, lesions or ulcercations.  Neurological: Pt reports insomnia. Denies dizziness, difficulty with memory, difficulty with speech or problems with balance and coordination.  Psych: Pt reports depression. Denies anxiety, SI/HI.  No other specific complaints in a complete review of systems (except as listed in HPI above).  PE:   BP 126/84   Pulse 80   Temp 98.7 F (37.1  C) (Oral)   Wt 188 lb 12 oz (85.6 kg)   SpO2 98%   BMI 32.40 kg/m   Wt Readings from Last 3 Encounters:  09/27/16 189 lb (85.7 kg)  08/30/16 195 lb (88.5 kg)  06/19/16 196 lb (88.9 kg)    General: Appears her stated age, in NAD. Cardiovascular: Normal rate and rhythm. Right pedal pulse 2+. Pulmonary/Chest: Normal effort and positive vesicular breath sounds. No respiratory distress. No wheezes, rales or ronchi noted.  Musculoskeletal: Normal flexion, extension and rotation of the right ankle. Swelling noted over the lateral malleolus. Pain with palpation over the 4th/5th metatarsals. She limps with normal gait. She has difficulty walking on toes and heels. Neurological: Alert and oriented.  Psychiatric: Mood and affect normal. Behavior is normal. Judgment and thought content normal.     BMET    Component Value Date/Time   NA 140 06/04/2014 1335   K 4.2 06/04/2014 1335   CL 107 06/04/2014 1335   CO2 27 06/04/2014 1335   GLUCOSE 106 (H) 06/04/2014 1335   BUN 9 06/04/2014 1335   CREATININE 0.60 06/04/2014 1335   CALCIUM 9.4 06/04/2014 1335   GFRNONAA >60 06/04/2014 1335   GFRAA >60 06/04/2014 1335    Lipid Panel  No results found for: CHOL, TRIG, HDL, CHOLHDL, VLDL, LDLCALC  CBC    Component Value Date/Time   WBC 10.8 05/31/2016 1546   RBC 4.45 05/31/2016 1546   HGB 13.3 05/31/2016 1546   HGB 13.7 06/04/2014 1335   HCT 39.6 05/31/2016 1546   HCT 40.6 06/04/2014 1335   PLT 292 05/31/2016 1546   PLT 240 06/04/2014 1335   MCV 88.9 05/31/2016 1546   MCV 91 06/04/2014 1335   MCH 29.9 05/31/2016 1546   MCHC 33.6 05/31/2016 1546   RDW 12.9 05/31/2016 1546   RDW 12.6 06/04/2014 1335   LYMPHSABS 1.3 05/31/2016 1546   LYMPHSABS 2.0 06/04/2014 1335   MONOABS 0.8 05/31/2016 1546   MONOABS 0.5 06/04/2014 1335   EOSABS 0.0 05/31/2016 1546   EOSABS 0.1 06/04/2014 1335   BASOSABS 0.1 05/31/2016 1546   BASOSABS 0.1 06/04/2014 1335    Hgb A1C No results found for:  HGBA1C   Assessment and Plan:  Right Foot Pain s/p Sprain:  Will obtain xray of right foot today She will continue to wear the brace and take Ibuprofen, refilled today Ice as needed  Will follow up after xray, return precautions discussed Nicki ReaperBAITY, Jarrad Mclees, NP

## 2016-10-18 NOTE — Assessment & Plan Note (Signed)
She will continue to work with a chiropractor to control her headaches.

## 2016-10-25 ENCOUNTER — Ambulatory Visit: Payer: BLUE CROSS/BLUE SHIELD | Admitting: Internal Medicine

## 2016-10-25 DIAGNOSIS — F411 Generalized anxiety disorder: Secondary | ICD-10-CM | POA: Diagnosis not present

## 2016-11-01 DIAGNOSIS — M9902 Segmental and somatic dysfunction of thoracic region: Secondary | ICD-10-CM | POA: Diagnosis not present

## 2016-11-01 DIAGNOSIS — M9901 Segmental and somatic dysfunction of cervical region: Secondary | ICD-10-CM | POA: Diagnosis not present

## 2016-11-01 DIAGNOSIS — R51 Headache: Secondary | ICD-10-CM | POA: Diagnosis not present

## 2016-11-01 DIAGNOSIS — M5414 Radiculopathy, thoracic region: Secondary | ICD-10-CM | POA: Diagnosis not present

## 2016-11-15 DIAGNOSIS — F411 Generalized anxiety disorder: Secondary | ICD-10-CM | POA: Diagnosis not present

## 2016-11-22 ENCOUNTER — Encounter: Payer: Self-pay | Admitting: *Deleted

## 2016-11-22 DIAGNOSIS — F411 Generalized anxiety disorder: Secondary | ICD-10-CM | POA: Diagnosis not present

## 2016-11-28 ENCOUNTER — Encounter
Admission: RE | Disposition: A | Discharge status: Home / Self Care | Payer: Self-pay | Source: Ambulatory Visit | Attending: General Surgery

## 2016-11-28 ENCOUNTER — Ambulatory Visit
Admission: RE | Admit: 2016-11-28 | Discharge: 2016-11-28 | Disposition: A | Discharge status: Home / Self Care | Payer: BLUE CROSS/BLUE SHIELD | Source: Ambulatory Visit | Attending: General Surgery | Admitting: General Surgery

## 2016-11-28 ENCOUNTER — Ambulatory Visit: Payer: BLUE CROSS/BLUE SHIELD | Admitting: Anesthesiology

## 2016-11-28 DIAGNOSIS — Z1211 Encounter for screening for malignant neoplasm of colon: Secondary | ICD-10-CM | POA: Insufficient documentation

## 2016-11-28 DIAGNOSIS — Z8719 Personal history of other diseases of the digestive system: Secondary | ICD-10-CM | POA: Diagnosis not present

## 2016-11-28 DIAGNOSIS — R011 Cardiac murmur, unspecified: Secondary | ICD-10-CM | POA: Insufficient documentation

## 2016-11-28 DIAGNOSIS — F329 Major depressive disorder, single episode, unspecified: Secondary | ICD-10-CM | POA: Insufficient documentation

## 2016-11-28 DIAGNOSIS — K5730 Diverticulosis of large intestine without perforation or abscess without bleeding: Secondary | ICD-10-CM | POA: Diagnosis not present

## 2016-11-28 HISTORY — PX: COLONOSCOPY WITH PROPOFOL: SHX5780

## 2016-11-28 LAB — POCT PREGNANCY, URINE: Preg Test, Ur: NEGATIVE

## 2016-11-28 SURGERY — COLONOSCOPY WITH PROPOFOL
Anesthesia: General

## 2016-11-28 MED ORDER — FENTANYL CITRATE (PF) 100 MCG/2ML IJ SOLN
INTRAMUSCULAR | Status: AC
  Filled 2016-11-28: qty 2

## 2016-11-28 MED ORDER — PROPOFOL 500 MG/50ML IV EMUL
INTRAVENOUS | Status: DC | PRN
  Administered 2016-11-28: 120 ug/kg/min via INTRAVENOUS

## 2016-11-28 MED ORDER — FENTANYL CITRATE (PF) 100 MCG/2ML IJ SOLN
INTRAMUSCULAR | Status: DC | PRN
  Administered 2016-11-28 (×2): 25 ug via INTRAVENOUS

## 2016-11-28 MED ORDER — PROPOFOL 10 MG/ML IV BOLUS
INTRAVENOUS | Status: DC | PRN
  Administered 2016-11-28: 90 mg via INTRAVENOUS

## 2016-11-28 MED ORDER — PROPOFOL 500 MG/50ML IV EMUL
INTRAVENOUS | Status: AC
  Filled 2016-11-28: qty 50

## 2016-11-28 MED ORDER — SODIUM CHLORIDE 0.9 % IV SOLN
INTRAVENOUS | Status: DC
  Administered 2016-11-28: 1000 mL via INTRAVENOUS

## 2016-11-28 NOTE — Anesthesia Post-op Follow-up Note (Signed)
Anesthesia QCDR form completed.        

## 2016-11-28 NOTE — H&P (Signed)
[name] [id number] Sep 11, 1975     HPI: 41 y.o woman with previous right sided diverticulitis. For screening colonoscopy. Tolerated prep well.   Prescriptions Prior to Admission  Medication Sig Dispense Refill Last Dose  . buPROPion (WELLBUTRIN XL) 300 MG 24 hr tablet Take 300 mg by mouth every morning.  0 11/27/2016 at Unknown time  . ibuprofen (ADVIL,MOTRIN) 600 MG tablet Take 1 tablet (600 mg total) by mouth every 8 (eight) hours as needed. 30 tablet 2 11/27/2016 at Unknown time  . levonorgestrel (MIRENA) 20 MCG/24HR IUD 1 each by Intrauterine route once.    Past Week at Unknown time  . traZODone (DESYREL) 50 MG tablet take 1/2 to 1 tablet by mouth at bedtime if needed for sleep  0 Past Week at Unknown time  . zaleplon (SONATA) 10 MG capsule take 1 capsule by mouth at bedtime if needed for sleep and may re...  (REFER TO PRESCRIPTION NOTES).  0 Past Week at Unknown time   No Known Allergies Past Medical History:  Diagnosis Date  . Depression   . Diverticulosis 2018  . Frequent headaches   . Heart murmur   . History of chicken pox    Past Surgical History:  Procedure Laterality Date  . EXCISIONAL HEMORRHOIDECTOMY  2014   Social History   Social History  . Marital status: Divorced    Spouse name: N/A  . Number of children: N/A  . Years of education: N/A   Occupational History  . Not on file.   Social History Main Topics  . Smoking status: Never Smoker  . Smokeless tobacco: Never Used  . Alcohol use Yes     Comment: occasional   . Drug use: No  . Sexual activity: Not Currently    Birth control/ protection: None   Other Topics Concern  . Not on file   Social History Narrative  . No narrative on file   Social History   Social History Narrative  . No narrative on file     ROS: Negative.     PE: HEENT: Negative. Lungs: Clear. Cardio: RR.  Assessment/Plan:  Proceed with planned endoscopy. Earline Mayotte 11/28/2016   Assessment/Plan:  Proceed with planned endoscopy.

## 2016-11-28 NOTE — Anesthesia Postprocedure Evaluation (Signed)
Anesthesia Post Note  Patient: [name]  Procedure(s) Performed: COLONOSCOPY WITH PROPOFOL (N/A )  Patient location during evaluation: Endoscopy Anesthesia Type: General Level of consciousness: awake and alert Pain management: pain level controlled Vital Signs Assessment: post-procedure vital signs reviewed and stable Respiratory status: spontaneous breathing and respiratory function stable Cardiovascular status: stable Anesthetic complications: no     Last Vitals:  Vitals:   11/28/16 0800 11/28/16 0805  BP: 110/76 110/76  Pulse: 81 80  Resp: 19 20  Temp: 36.4 C   SpO2: 100% 100%    Last Pain:  Vitals:   11/28/16 0800  TempSrc: Tympanic                 KEPHART,WILLIAM K

## 2016-11-28 NOTE — Transfer of Care (Signed)
Immediate Anesthesia Transfer of Care Note  Patient: [name]  Procedure(s) Performed: COLONOSCOPY WITH PROPOFOL (N/A )  Patient Location: PACU, Endoscopy Unit and endoscopy  Anesthesia Type:General  Level of Consciousness: drowsy and patient cooperative  Airway & Oxygen Therapy: Patient Spontanous Breathing and Patient connected to nasal cannula oxygen  Post-op Assessment: Report given to RN and Post -op Vital signs reviewed and stable  Post vital signs: Reviewed and stable  Last Vitals:  Vitals:   11/28/16 0800 11/28/16 0805  BP: (P) 110/76 110/76  Pulse: (P) 81 80  Resp: (P) 19 20  Temp: (P) 36.4 C   SpO2: (P) 100% 100%    Last Pain:  Vitals:   11/28/16 0800  TempSrc: (P) Tympanic         Complications: No apparent anesthesia complications

## 2016-11-28 NOTE — Anesthesia Preprocedure Evaluation (Signed)
Anesthesia Evaluation  Patient identified by MRN, date of birth, ID band Patient awake    Reviewed: Allergy & Precautions, NPO status , Patient's Chart, lab work & pertinent test results  History of Anesthesia Complications Negative for: history of anesthetic complications  Airway Mallampati: II       Dental   Pulmonary neg sleep apnea, neg COPD,           Cardiovascular (-) hypertension(-) Past MI and (-) CHF (-) dysrhythmias + Valvular Problems/Murmurs (murmur, no tx)      Neuro/Psych neg Seizures Depression    GI/Hepatic Neg liver ROS, neg GERD  ,  Endo/Other  neg diabetes  Renal/GU negative Renal ROS     Musculoskeletal   Abdominal   Peds  Hematology   Anesthesia Other Findings   Reproductive/Obstetrics                             Anesthesia Physical Anesthesia Plan  ASA: II  Anesthesia Plan: General   Post-op Pain Management:    Induction: Intravenous  PONV Risk Score and Plan: Propofol infusion  Airway Management Planned:   Additional Equipment:   Intra-op Plan:   Post-operative Plan:   Informed Consent: I have reviewed the patients History and Physical, chart, labs and discussed the procedure including the risks, benefits and alternatives for the proposed anesthesia with the patient or authorized representative who has indicated his/her understanding and acceptance.     Plan Discussed with:   Anesthesia Plan Comments:         Anesthesia Quick Evaluation

## 2016-11-28 NOTE — Op Note (Signed)
[facility] Gastroenterology Patient Name: [name] Procedure Date: 11/28/2016 7:42 AM MRN: [id number] Account #: [id number] Date of Birth: 06-25-1975 Admit Type: Outpatient Age: 41 Room: [facility] ENDO ROOM 1 Gender: Female Note Status: Finalized Procedure:            Colonoscopy Indications:          Screening for colorectal malignant neoplasm. Episode of                        right side diverticulitis with focal abscess Providers:            Earline Mayotte, MD Referring MD:         Lorre Munroe (Referring MD) Medicines:            Monitored Anesthesia Care Complications:        No immediate complications. Procedure:            Pre-Anesthesia Assessment:                       - Prior to the procedure, a History and Physical was                        performed, and patient medications, allergies and                        sensitivities were reviewed. The patient's tolerance of                        previous anesthesia was reviewed.                       - The risks and benefits of the procedure and the                        sedation options and risks were discussed with the                        patient. All questions were answered and informed                        consent was obtained.                       After obtaining informed consent, the colonoscope was                        passed under direct vision. Throughout the procedure,                        the patient's blood pressure, pulse, and oxygen                        saturations were monitored continuously. The                        Colonoscope was introduced through the anus and                        advanced to the the cecum, identified by appendiceal  orifice and ileocecal valve. The colonoscopy was                        performed without difficulty. The patient tolerated the                        procedure well. The quality of the bowel preparation                         was excellent. Findings:      Multiple medium-mouthed diverticula were found in the sigmoid colon and       proximal transverse colon.      The retroflexed view of the distal rectum and anal verge was normal and       showed no anal or rectal abnormalities. Impression:           - Diverticulosis in the sigmoid colon and in the                        proximal transverse colon.                       - The distal rectum and anal verge are normal on                        retroflexion view.                       - No specimens collected. Recommendation:       - High fiber diet indefinitely.                       - Repeat colonoscopy in 10 years for screening purposes. Procedure Code(s):    --- Professional ---                       [id number], Colonoscopy, flexible; diagnostic, including                        collection of specimen(s) by brushing or washing, when                        performed (separate procedure) Diagnosis Code(s):    --- Professional ---                       Z12.11, Encounter for screening for malignant neoplasm                        of colon                       K57.30, Diverticulosis of large intestine without                        perforation or abscess without bleeding CPT copyright 2016 American [employer]. All rights reserved. The codes documented in this report are preliminary and upon coder review may  be revised to meet current compliance requirements. Earline Mayotte, MD 11/28/2016 8:03:34 AM This report has been signed electronically. Number of Addenda: 0 Note Initiated On: 11/28/2016 7:42 AM Scope Withdrawal Time: 0 hours 8 minutes 4 seconds  Total Procedure Duration: 0 hours 13  minutes 1 second       [facility]

## 2016-11-29 ENCOUNTER — Encounter: Payer: Self-pay | Admitting: General Surgery

## 2016-12-06 DIAGNOSIS — F411 Generalized anxiety disorder: Secondary | ICD-10-CM | POA: Diagnosis not present

## 2016-12-19 IMAGING — US US ABDOMEN COMPLETE
1 series · 14 of 25 positions shown · non-contrast
Comparison: None.

CLINICAL DATA: Right upper and lower quadrant abdominal pain for 5
days with nausea and vomiting.

EXAM:
ABDOMEN ULTRASOUND COMPLETE

[Series 1: us abdomen complete · 0.25mm/px · 14 of 84 slices shown]
[im 1/84]
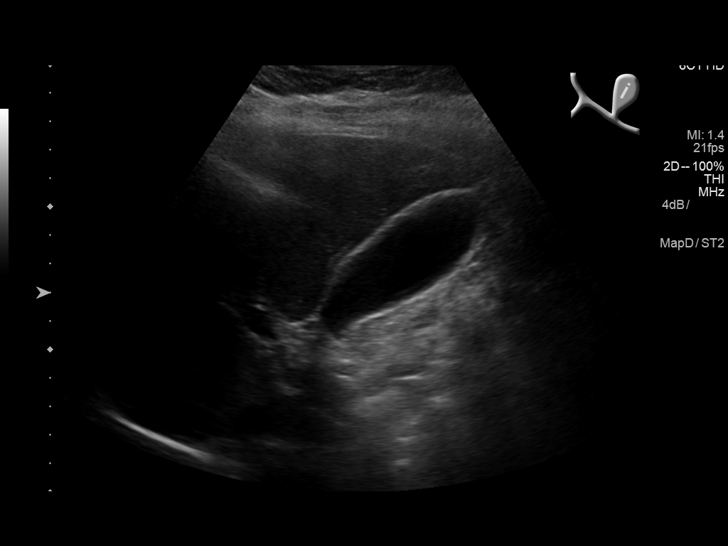
[im 7/84]
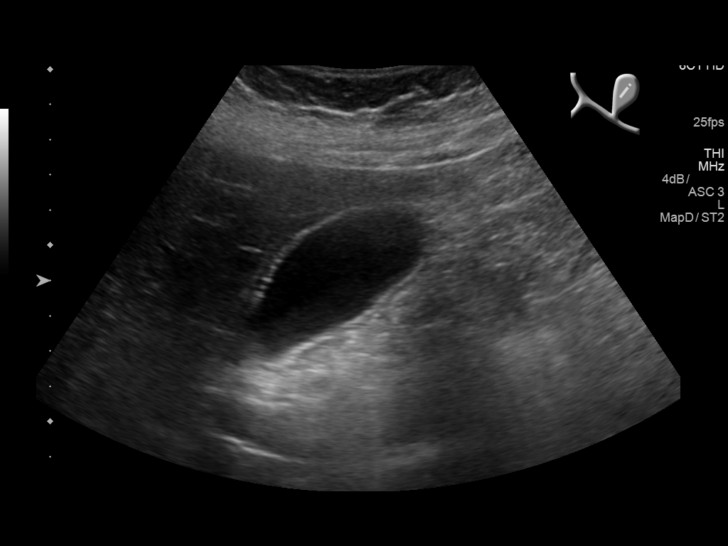
[im 14/84]
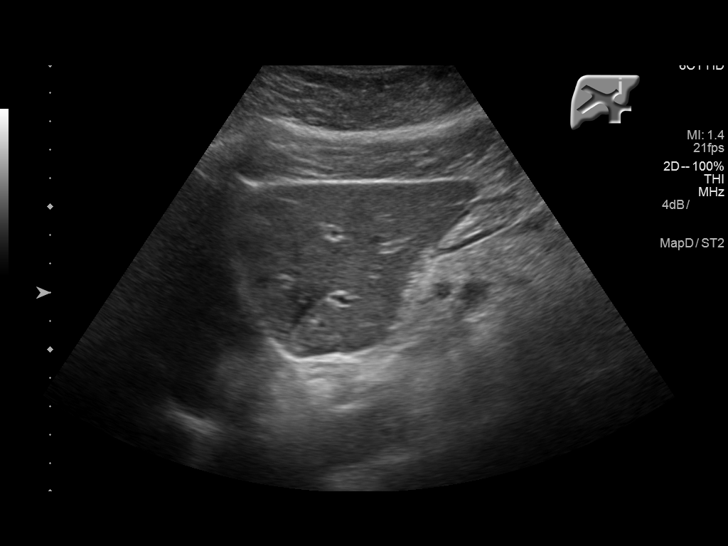
[im 21/84]
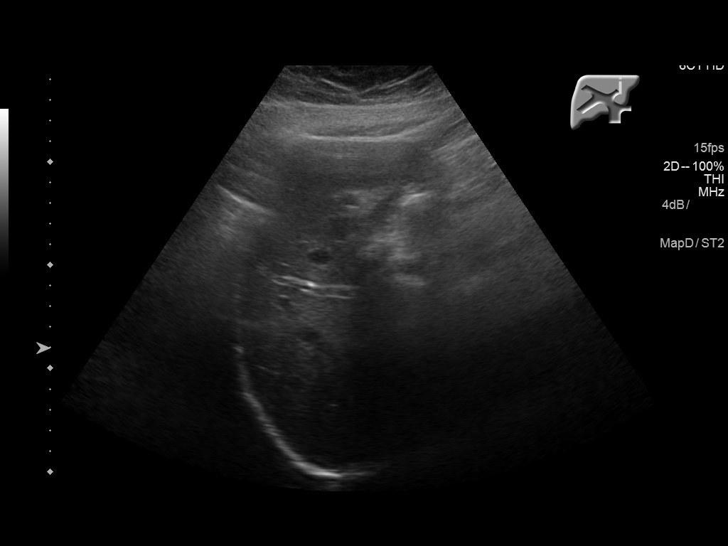
[im 28/84]
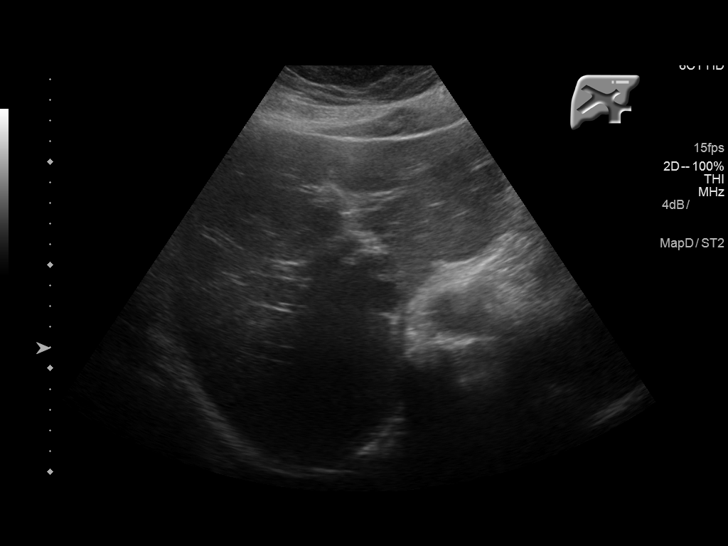
[im 32/84]
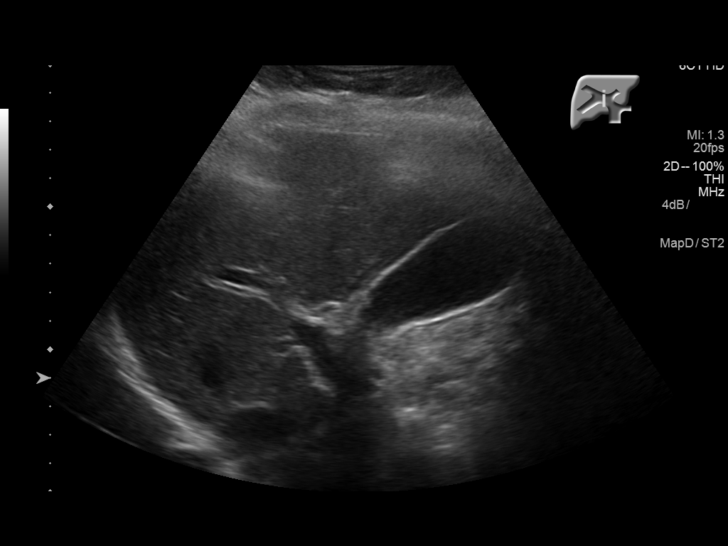
[im 39/84]
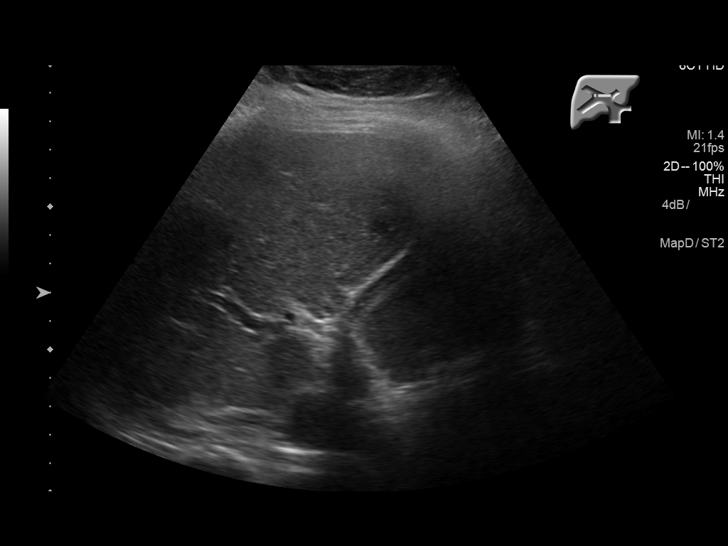
[im 45/84]
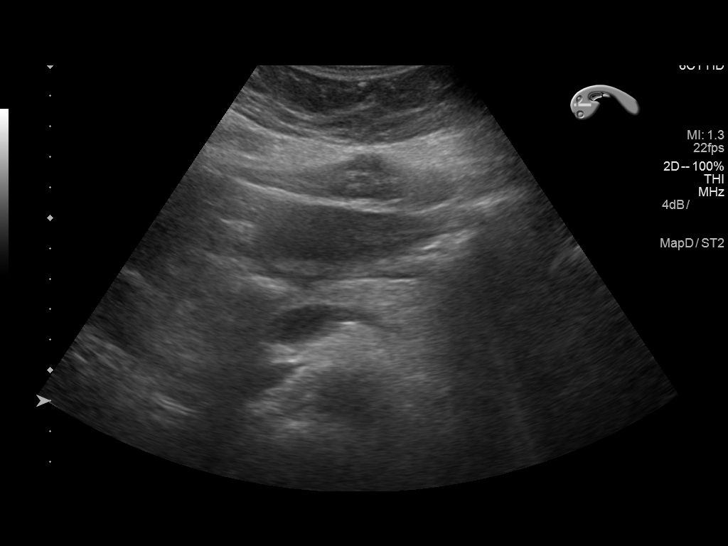
[im 52/84]
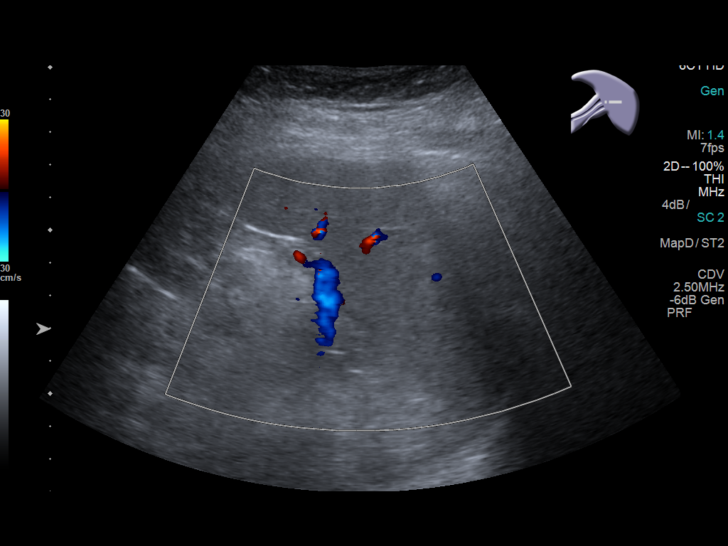
[im 56/84]
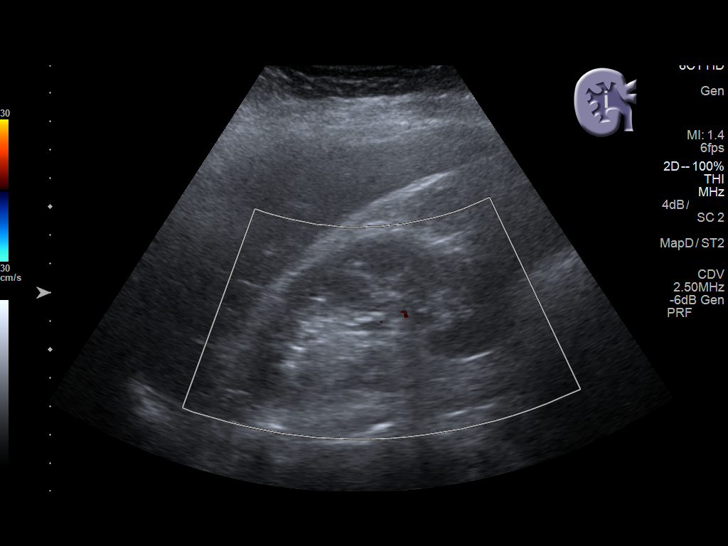
[im 63/84]
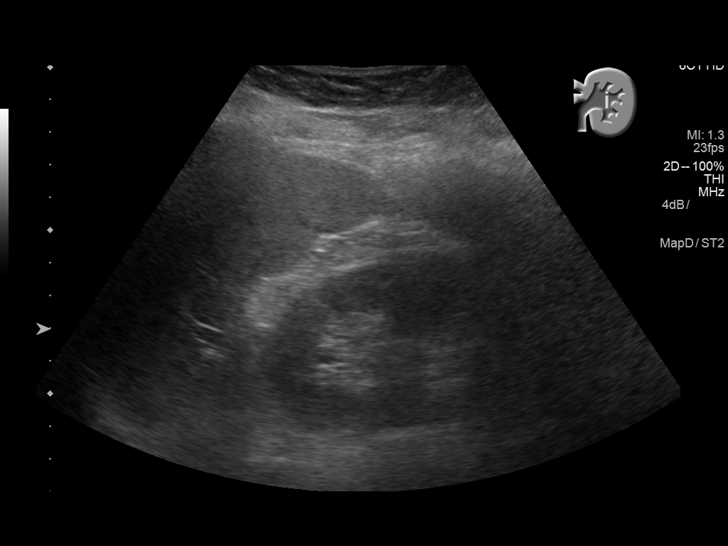
[im 70/84]
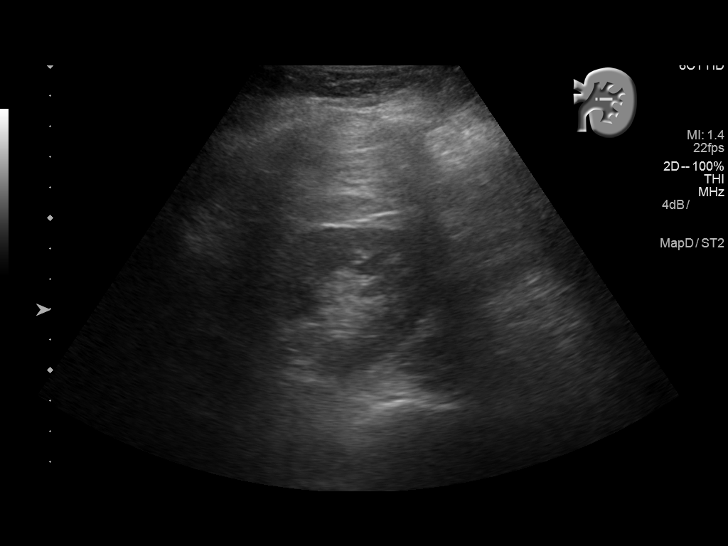
[im 77/84]
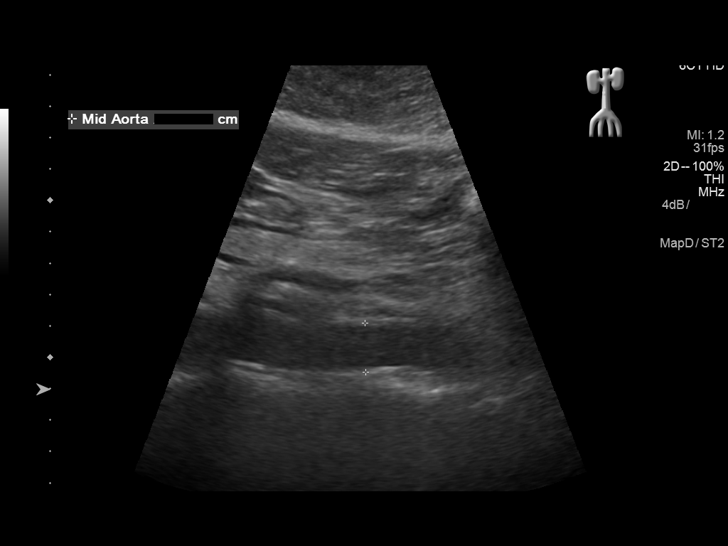
[im 84/84]
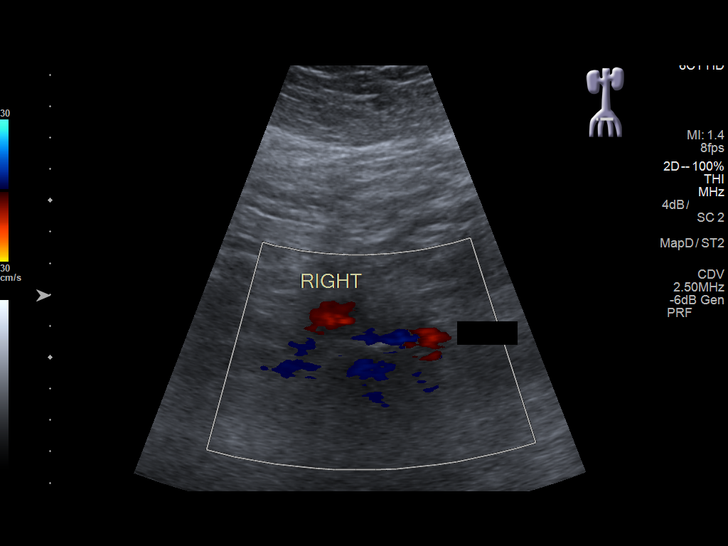

[14 of 25 positions shown; findings below may reference images not displayed]

FINDINGS: Gallbladder: Gallbladder sludge. No gallstones, wall thickening or
sonographic Murphy's sign.

Common bile duct: Diameter: 4 mm

Liver: No focal lesion identified. Within normal limits in
parenchymal echogenicity.

IVC: No abnormality visualized.

Pancreas: Visualized portion unremarkable.

Spleen: Size and appearance within normal limits.

Right Kidney: Length: 11.0 cm. Echogenicity within normal limits. No
mass or hydronephrosis visualized.

Left Kidney: Length: 11.6 cm. Echogenicity within normal limits. No
mass or hydronephrosis visualized.

Abdominal aorta: No aneurysm visualized.

Other findings: None.
IMPRESSION: 1. Gallbladder sludge. No evidence of gallstones, biliary dilatation
or cholecystitis.
2. No other significant findings.

## 2016-12-20 DIAGNOSIS — F411 Generalized anxiety disorder: Secondary | ICD-10-CM | POA: Diagnosis not present

## 2016-12-27 DIAGNOSIS — F411 Generalized anxiety disorder: Secondary | ICD-10-CM | POA: Diagnosis not present

## 2017-01-17 ENCOUNTER — Encounter: Payer: Self-pay | Admitting: Obstetrics & Gynecology

## 2017-01-17 ENCOUNTER — Ambulatory Visit (INDEPENDENT_AMBULATORY_CARE_PROVIDER_SITE_OTHER): Payer: BLUE CROSS/BLUE SHIELD | Admitting: Obstetrics & Gynecology

## 2017-01-17 VITALS — BP 130/88 | HR 92 | Wt 190.1 lb

## 2017-01-17 DIAGNOSIS — N907 Vulvar cyst: Secondary | ICD-10-CM

## 2017-01-17 DIAGNOSIS — F331 Major depressive disorder, recurrent, moderate: Secondary | ICD-10-CM | POA: Diagnosis not present

## 2017-01-17 NOTE — Progress Notes (Signed)
Patient ID: [name], female   DOB: 02/28/1975, 41 y.o.   MRN: [id number]  Chief Complaint  Patient presents with  . Vaginal bump    HPI [name] is a 41 y.o. female.  She has felt a small bump in her left labia major for several months. She would also like for me to make sure that her IUD strings are visualized. HPI  Past Medical History:  Diagnosis Date  . Depression   . Diverticulosis 2018  . Frequent headaches   . Heart murmur   . History of chicken pox     Past Surgical History:  Procedure Laterality Date  . COLONOSCOPY WITH PROPOFOL N/A 11/28/2016   Procedure: COLONOSCOPY WITH PROPOFOL;  Surgeon: Earline MayotteByrnett, Jeffrey W, MD;  Location: [facility] ENDOSCOPY;  Service: Endoscopy;  Laterality: N/A;  . EXCISIONAL HEMORRHOIDECTOMY  2014    Family History  Problem Relation Age of Onset  . Heart disease Mother        Unsure    Social History Social History   Tobacco Use  . Smoking status: Never Smoker  . Smokeless tobacco: Never Used  Substance Use Topics  . Alcohol use: Yes    Comment: occasional   . Drug use: No    No Known Allergies  Current Outpatient Medications  Medication Sig Dispense Refill  . buPROPion (WELLBUTRIN XL) 300 MG 24 hr tablet Take 300 mg by mouth every morning.  0  . ibuprofen (ADVIL,MOTRIN) 600 MG tablet Take 1 tablet (600 mg total) by mouth every 8 (eight) hours as needed. 30 tablet 2  . levonorgestrel (MIRENA) 20 MCG/24HR IUD 1 each by Intrauterine route once.     . traZODone (DESYREL) 50 MG tablet take 1/2 to 1 tablet by mouth at bedtime if needed for sleep  0  . zaleplon (SONATA) 10 MG capsule take 1 capsule by mouth at bedtime if needed for sleep and may re...  (REFER TO PRESCRIPTION NOTES).  0   No current facility-administered medications for this visit.     Review of Systems Review of Systems  Blood pressure 130/88, pulse 92, weight 190 lb 1.6 oz (86.2 kg). Breathing, conversing, and ambulating normally She has a  small sebum containing bump in her left  Physical Exam Physical Exam Breathing, conversing, and ambulating normally She has a small sebum containing bump in her left labia majora, reassurance given IUD strings seen Data Reviewed  Component 2164yr ago  Adequacy Satisfactory for evaluation endocervical/transformation zone component ABSENT.   Diagnosis NEGATIVE FOR INTRAEPITHELIAL LESIONS OR MALIGNANCY.   Chlamydia Negative   Comment: Normal Reference Range - Negative  Neisseria gonorrhea Negative   Comment: Normal Reference Range - Negative  HPV NOT DETECTED   Comment: Normal Reference Range - NOT Detected  Material Submitted CervicoVaginal Pap [ThinPrep Imaged]   CYTOLOGY      Assessment    Sebaceous cyst of labia    Plan    Reassurance given       Allie BossierMyra C Parveen Freehling 01/17/2017, 10:48 AM

## 2017-01-24 DIAGNOSIS — F411 Generalized anxiety disorder: Secondary | ICD-10-CM | POA: Diagnosis not present

## 2017-01-31 DIAGNOSIS — F411 Generalized anxiety disorder: Secondary | ICD-10-CM | POA: Diagnosis not present

## 2017-02-28 DIAGNOSIS — F411 Generalized anxiety disorder: Secondary | ICD-10-CM | POA: Diagnosis not present

## 2017-03-28 ENCOUNTER — Ambulatory Visit: Payer: BLUE CROSS/BLUE SHIELD | Admitting: Internal Medicine

## 2017-03-28 ENCOUNTER — Encounter: Payer: Self-pay | Admitting: Internal Medicine

## 2017-03-28 VITALS — BP 124/82 | HR 79 | Temp 98.1°F | Wt 180.0 lb

## 2017-03-28 DIAGNOSIS — Z113 Encounter for screening for infections with a predominantly sexual mode of transmission: Secondary | ICD-10-CM | POA: Diagnosis not present

## 2017-03-28 DIAGNOSIS — F411 Generalized anxiety disorder: Secondary | ICD-10-CM | POA: Diagnosis not present

## 2017-03-28 DIAGNOSIS — Z1159 Encounter for screening for other viral diseases: Secondary | ICD-10-CM

## 2017-03-28 NOTE — Patient Instructions (Signed)
Sexually Transmitted Disease  A sexually transmitted disease (STD) is a disease or infection that may be passed (transmitted) from person to person, usually during sexual activity. This may happen by way of saliva, semen, blood, vaginal mucus, or urine. Common STDs include:   Gonorrhea.   Chlamydia.   Syphilis.   HIV and AIDS.   Genital herpes.   Hepatitis B and C.   Trichomonas.   Human papillomavirus (HPV).   Pubic lice.   Scabies.   Mites.   Bacterial vaginosis.    What are the causes?  An STD may be caused by bacteria, a virus, or parasites. STDs are often transmitted during sexual activity if one person is infected. However, they may also be transmitted through nonsexual means. STDs may be transmitted after:   Sexual intercourse with an infected person.   Sharing sex toys with an infected person.   Sharing needles with an infected person or using unclean piercing or tattoo needles.   Having intimate contact with the genitals, mouth, or rectal areas of an infected person.   Exposure to infected fluids during birth.    What are the signs or symptoms?  Different STDs have different symptoms. Some people may not have any symptoms. If symptoms are present, they may include:   Painful or bloody urination.   Pain in the pelvis, abdomen, vagina, anus, throat, or eyes.   A skin rash, itching, or irritation.   Growths, ulcerations, blisters, or sores in the genital and anal areas.   Abnormal vaginal discharge with or without bad odor.   Penile discharge in men.   Fever.   Pain or bleeding during sexual intercourse.   Swollen glands in the groin area.   Yellow skin and eyes (jaundice). This is seen with hepatitis.   Swollen testicles.   Infertility.   Sores and blisters in the mouth.    How is this diagnosed?  To make a diagnosis, your health care provider may:   Take a medical history.   Perform a physical exam.   Take a sample of any discharge to examine.   Swab the throat, cervix,  opening to the penis, rectum, or vagina for testing.   Test a sample of your first morning urine.   Perform blood tests.   Perform a Pap test, if this applies.   Perform a colposcopy.   Perform a laparoscopy.    How is this treated?  Treatment depends on the STD. Some STDs may be treated but not cured.   Chlamydia, gonorrhea, trichomonas, and syphilis can be cured with antibiotic medicine.   Genital herpes, hepatitis, and HIV can be treated, but not cured, with prescribed medicines. The medicines lessen symptoms.   Genital warts from HPV can be treated with medicine or by freezing, burning (electrocautery), or surgery. Warts may come back.   HPV cannot be cured with medicine or surgery. However, abnormal areas may be removed from the cervix, vagina, or vulva.   If your diagnosis is confirmed, your recent sexual partners need treatment. This is true even if they are symptom-free or have a negative culture or evaluation. They should not have sex until their health care providers say it is okay.   Your health care provider may test you for infection again 3 months after treatment.    How is this prevented?  Take these steps to reduce your risk of getting an STD:   Use latex condoms, dental dams, and water-soluble lubricants during sexual activity. Do not use   petroleum jelly or oils.   Avoid having multiple sex partners.   Do not have sex with someone who has other sex partners.   Do not have sex with anyone you do not know or who is at high risk for an STD.   Avoid risky sex practices that can break your skin.   Do not have sex if you have open sores on your mouth or skin.   Avoid drinking too much alcohol or taking illegal drugs. Alcohol and drugs can affect your judgment and put you in a vulnerable position.   Avoid engaging in oral and anal sex acts.   Get vaccinated for HPV and hepatitis. If you have not received these vaccines in the past, talk to your health care provider about whether one or  both might be right for you.   If you are at risk of being infected with HIV, it is recommended that you take a prescription medicine daily to prevent HIV infection. This is called pre-exposure prophylaxis (PrEP). You are considered at risk if:  ? You are a man who has sex with other men (MSM).  ? You are a heterosexual man or woman and are sexually active with more than one partner.  ? You take drugs by injection.  ? You are sexually active with a partner who has HIV.   Talk with your health care provider about whether you are at high risk of being infected with HIV. If you choose to begin PrEP, you should first be tested for HIV. You should then be tested every 3 months for as long as you are taking PrEP.    Contact a health care provider if:   See your health care provider.   Tell your sexual partner(s). They should be tested and treated for any STDs.   Do not have sex until your health care provider says it is okay.  Get help right away if:  Contact your health care provider right away if:   You have severe abdominal pain.   You are a man and notice swelling or pain in your testicles.   You are a woman and notice swelling or pain in your vagina.    This information is not intended to replace advice given to you by your health care provider. Make sure you discuss any questions you have with your health care provider.  Document Released: 04/28/2002 Document Revised: 08/26/2015 Document Reviewed: 08/26/2012  Elsevier Interactive Patient Education  2018 [employer]

## 2017-03-28 NOTE — Progress Notes (Signed)
Subjective:    Patient ID: [name], female    DOB: [birth year], 42 y.o.   MRN: [id number]  HPI  Pt presents to the clinic today requesting a blood test to evaluate for herpes and Hep C. She reports she has become sexually active with 1 partner. They are not using condoms. He recently got tested and it came back positive for HSV1. She reports she went to the health department. They did all the testing except herpes and Hep C. She denies any active lesions on the mouth or genitalia. She denies any other exposure that she is aware of, except her current partner.  Review of Systems      Past Medical History:  Diagnosis Date  . Depression   . Diverticulosis 2018  . Frequent headaches   . Heart murmur   . History of chicken pox     Current Outpatient Medications  Medication Sig Dispense Refill  . buPROPion (WELLBUTRIN XL) 300 MG 24 hr tablet Take 300 mg by mouth every morning.  0  . ibuprofen (ADVIL,MOTRIN) 600 MG tablet Take 1 tablet (600 mg total) by mouth every 8 (eight) hours as needed. 30 tablet 2  . levonorgestrel (MIRENA) 20 MCG/24HR IUD 1 each by Intrauterine route once.     . traZODone (DESYREL) 50 MG tablet take 1/2 to 1 tablet by mouth at bedtime if needed for sleep  0  . zaleplon (SONATA) 10 MG capsule take 1 capsule by mouth at bedtime if needed for sleep and may re...  (REFER TO PRESCRIPTION NOTES).  0   No current facility-administered medications for this visit.     No Known Allergies  Family History  Problem Relation Age of Onset  . Heart disease Mother        Unsure    Social History   Socioeconomic History  . Marital status: Divorced    Spouse name: Not on file  . Number of children: Not on file  . Years of education: Not on file  . Highest education level: Not on file  Social Needs  . Financial resource strain: Not on file  . Food insecurity - worry: Not on file  . Food insecurity - inability: Not on file  . Transportation needs -  medical: Not on file  . Transportation needs - non-medical: Not on file  Occupational History  . Not on file  Tobacco Use  . Smoking status: Never Smoker  . Smokeless tobacco: Never Used  Substance and Sexual Activity  . Alcohol use: Yes    Comment: occasional   . Drug use: No  . Sexual activity: Not Currently    Birth control/protection: None  Other Topics Concern  . Not on file  Social History Narrative  . Not on file     Constitutional: Denies fever, malaise, fatigue, headache or abrupt weight changes.  GU: Denies urgency, frequency, pain with urination, burning sensation, blood in urine, odor or discharge. Skin: Denies redness, rashes, lesions or ulcercations.   No other specific complaints in a complete review of systems (except as listed in HPI above).  Objective:   Physical Exam  BP 124/82   Pulse 79   Temp 98.1 F (36.7 C) (Oral)   Wt 180 lb (81.6 kg)   SpO2 98%   BMI 30.90 kg/m  Wt Readings from Last 3 Encounters:  03/28/17 180 lb (81.6 kg)  01/17/17 190 lb 1.6 oz (86.2 kg)  11/28/16 184 lb (83.5 kg)    General: Appears  her stated age, in NAD. Skin: Warm, dry and intact. No rashes, lesions or ulcerations noted. Neurological: Alert and oriented.    BMET    Component Value Date/Time   NA 140 06/04/2014 1335   K 4.2 06/04/2014 1335   CL 107 06/04/2014 1335   CO2 27 06/04/2014 1335   GLUCOSE 106 (H) 06/04/2014 1335   BUN 9 06/04/2014 1335   CREATININE 0.60 06/04/2014 1335   CALCIUM 9.4 06/04/2014 1335   GFRNONAA >60 06/04/2014 1335   GFRAA >60 06/04/2014 1335    Lipid Panel  No results found for: CHOL, TRIG, HDL, CHOLHDL, VLDL, LDLCALC  CBC    Component Value Date/Time   WBC 10.8 05/31/2016 1546   RBC 4.45 05/31/2016 1546   HGB 13.3 05/31/2016 1546   HGB 13.7 06/04/2014 1335   HCT 39.6 05/31/2016 1546   HCT 40.6 06/04/2014 1335   PLT 292 05/31/2016 1546   PLT 240 06/04/2014 1335   MCV 88.9 05/31/2016 1546   MCV 91 06/04/2014 1335    MCH 29.9 05/31/2016 1546   MCHC 33.6 05/31/2016 1546   RDW 12.9 05/31/2016 1546   RDW 12.6 06/04/2014 1335   LYMPHSABS 1.3 05/31/2016 1546   LYMPHSABS 2.0 06/04/2014 1335   MONOABS 0.8 05/31/2016 1546   MONOABS 0.5 06/04/2014 1335   EOSABS 0.0 05/31/2016 1546   EOSABS 0.1 06/04/2014 1335   BASOSABS 0.1 05/31/2016 1546   BASOSABS 0.1 06/04/2014 1335    Hgb A1C No results found for: HGBA1C          Assessment & Plan:   Screen for STD:  HSV 1&2, IgG and IgM  Screen for Hep C:  Hep C antibody today  Will follow up after testing, return precautions discussed Nicki Reaper, NP

## 2017-03-29 LAB — HSV(HERPES SIMPLEX VRS) I + II AB-IGG
HAV 1 IGG,TYPE SPECIFIC AB: >58 index — ABNORMAL HIGH
HSV 2 IGG,TYPE SPECIFIC AB: <0.9 index

## 2017-03-29 LAB — HEPATITIS C ANTIBODY
Hepatitis C Ab: NONREACTIVE
SIGNAL TO CUT-OFF: 0.03 (ref <1.00)

## 2017-03-30 LAB — HSV 1/2 AB (IGM), IFA W/RFLX TITER
HSV 1 IgM Screen: NEGATIVE
HSV 2 IGM SCREEN: NEGATIVE

## 2017-04-18 DIAGNOSIS — M9902 Segmental and somatic dysfunction of thoracic region: Secondary | ICD-10-CM | POA: Diagnosis not present

## 2017-04-18 DIAGNOSIS — M9901 Segmental and somatic dysfunction of cervical region: Secondary | ICD-10-CM | POA: Diagnosis not present

## 2017-04-18 DIAGNOSIS — R51 Headache: Secondary | ICD-10-CM | POA: Diagnosis not present

## 2017-04-18 DIAGNOSIS — M5414 Radiculopathy, thoracic region: Secondary | ICD-10-CM | POA: Diagnosis not present

## 2017-04-25 DIAGNOSIS — F331 Major depressive disorder, recurrent, moderate: Secondary | ICD-10-CM | POA: Diagnosis not present

## 2017-05-16 DIAGNOSIS — M5414 Radiculopathy, thoracic region: Secondary | ICD-10-CM | POA: Diagnosis not present

## 2017-05-16 DIAGNOSIS — M9902 Segmental and somatic dysfunction of thoracic region: Secondary | ICD-10-CM | POA: Diagnosis not present

## 2017-05-16 DIAGNOSIS — R51 Headache: Secondary | ICD-10-CM | POA: Diagnosis not present

## 2017-05-16 DIAGNOSIS — M9901 Segmental and somatic dysfunction of cervical region: Secondary | ICD-10-CM | POA: Diagnosis not present

## 2017-05-18 DIAGNOSIS — F411 Generalized anxiety disorder: Secondary | ICD-10-CM | POA: Diagnosis not present

## 2017-05-30 DIAGNOSIS — F331 Major depressive disorder, recurrent, moderate: Secondary | ICD-10-CM | POA: Diagnosis not present

## 2017-06-20 ENCOUNTER — Ambulatory Visit: Payer: BLUE CROSS/BLUE SHIELD | Admitting: Family Medicine

## 2017-06-20 ENCOUNTER — Other Ambulatory Visit: Payer: Self-pay

## 2017-06-20 ENCOUNTER — Encounter: Payer: Self-pay | Admitting: Family Medicine

## 2017-06-20 DIAGNOSIS — M5414 Radiculopathy, thoracic region: Secondary | ICD-10-CM | POA: Diagnosis not present

## 2017-06-20 DIAGNOSIS — R51 Headache: Secondary | ICD-10-CM | POA: Diagnosis not present

## 2017-06-20 DIAGNOSIS — M9902 Segmental and somatic dysfunction of thoracic region: Secondary | ICD-10-CM | POA: Diagnosis not present

## 2017-06-20 DIAGNOSIS — R05 Cough: Secondary | ICD-10-CM | POA: Diagnosis not present

## 2017-06-20 DIAGNOSIS — F411 Generalized anxiety disorder: Secondary | ICD-10-CM | POA: Diagnosis not present

## 2017-06-20 DIAGNOSIS — M9901 Segmental and somatic dysfunction of cervical region: Secondary | ICD-10-CM | POA: Diagnosis not present

## 2017-06-20 DIAGNOSIS — R053 Chronic cough: Secondary | ICD-10-CM | POA: Insufficient documentation

## 2017-06-20 NOTE — Progress Notes (Signed)
   Subjective:    Patient ID: [name], female    DOB: 1975-10-25, 42 y.o.   MRN: [id number]  Cough  This is a new problem. The current episode started more than 1 month ago. The problem has been gradually worsening. The problem occurs constantly. The cough is non-productive. Pertinent negatives include no ear congestion, ear pain, fever, nasal congestion, postnasal drip, sore throat or shortness of breath. Associated symptoms comments:  Sinus pressure mild  tickle in throat. The symptoms are aggravated by lying down. Risk factors: nonsmoker. She has tried OTC cough suppressant ( improves with water, tried claritin, tylenol cold) for the symptoms. The treatment provided mild relief. Her past medical history is significant for asthma. There is no history of COPD, environmental allergies or pneumonia. reactive airways with exercise   No heartburn. No new meds.  Blood pressure 110/72, pulse 81, temperature 99.1 F (37.3 C), temperature source Oral, height  (1.626 m), weight 183 lb 8 oz (83.2 kg), SpO2 99 %.     Review of Systems  Constitutional: Negative for fever.  HENT: Negative for ear pain, postnasal drip and sore throat.   Respiratory: Positive for cough. Negative for shortness of breath.   Allergic/Immunologic: Negative for environmental allergies.       Objective:   Physical Exam  Constitutional: Vital signs are normal. She appears well-developed and well-nourished. She is cooperative.  Non-toxic appearance. She does not appear ill. No distress.  HENT:  Head: Normocephalic.  Right Ear: Hearing, tympanic membrane, external ear and ear canal normal. Tympanic membrane is not erythematous, not retracted and not bulging.  Left Ear: Hearing, tympanic membrane, external ear and ear canal normal. Tympanic membrane is not erythematous, not retracted and not bulging.  Nose: Mucosal edema present. No rhinorrhea. Right sinus exhibits no maxillary sinus tenderness and no  frontal sinus tenderness. Left sinus exhibits no maxillary sinus tenderness and no frontal sinus tenderness.  Mouth/Throat: Uvula is midline, oropharynx is clear and moist and mucous membranes are normal.  Small amount of clear fluids behind TMs,   Eyes: Pupils are equal, round, and reactive to light. Conjunctivae, EOM and lids are normal. Lids are everted and swept, no foreign bodies found.  Neck: Trachea normal and normal range of motion. Neck supple. Carotid bruit is not present. No thyroid mass and no thyromegaly present.  Cardiovascular: Normal rate, regular rhythm, S1 normal, S2 normal, normal heart sounds, intact distal pulses and normal pulses. Exam reveals no gallop and no friction rub.  No murmur heard. Pulmonary/Chest: Effort normal and breath sounds normal. No tachypnea. No respiratory distress. She has no decreased breath sounds. She has no wheezes. She has no rhonchi. She has no rales.  Neurological: She is alert.  Skin: Skin is warm, dry and intact. No rash noted.  Psychiatric: Her speech is normal and behavior is normal. Judgment normal. Her mood appears not anxious. Cognition and memory are normal. She does not exhibit a depressed mood.          Assessment & Plan:

## 2017-06-20 NOTE — Assessment & Plan Note (Signed)
Likely due to allergies and reactive airway. Start antihistmine and nasal steroid spray.

## 2017-06-20 NOTE — Patient Instructions (Addendum)
Start zyrtec generic at bedtime.  Start flonase 2 spray per nostril daily.  call if not improving in 2 weeks or sooner if fever , new shortness of breath, or worsening.

## 2017-06-27 ENCOUNTER — Ambulatory Visit: Payer: BLUE CROSS/BLUE SHIELD | Admitting: Internal Medicine

## 2017-07-11 DIAGNOSIS — F411 Generalized anxiety disorder: Secondary | ICD-10-CM | POA: Diagnosis not present

## 2017-07-25 DIAGNOSIS — R51 Headache: Secondary | ICD-10-CM | POA: Diagnosis not present

## 2017-07-25 DIAGNOSIS — F411 Generalized anxiety disorder: Secondary | ICD-10-CM | POA: Diagnosis not present

## 2017-07-25 DIAGNOSIS — M5414 Radiculopathy, thoracic region: Secondary | ICD-10-CM | POA: Diagnosis not present

## 2017-07-25 DIAGNOSIS — M9901 Segmental and somatic dysfunction of cervical region: Secondary | ICD-10-CM | POA: Diagnosis not present

## 2017-07-25 DIAGNOSIS — M9902 Segmental and somatic dysfunction of thoracic region: Secondary | ICD-10-CM | POA: Diagnosis not present

## 2017-08-08 DIAGNOSIS — F411 Generalized anxiety disorder: Secondary | ICD-10-CM | POA: Diagnosis not present

## 2017-08-15 DIAGNOSIS — M9902 Segmental and somatic dysfunction of thoracic region: Secondary | ICD-10-CM | POA: Diagnosis not present

## 2017-08-15 DIAGNOSIS — F411 Generalized anxiety disorder: Secondary | ICD-10-CM | POA: Diagnosis not present

## 2017-08-15 DIAGNOSIS — R51 Headache: Secondary | ICD-10-CM | POA: Diagnosis not present

## 2017-08-15 DIAGNOSIS — M5414 Radiculopathy, thoracic region: Secondary | ICD-10-CM | POA: Diagnosis not present

## 2017-08-15 DIAGNOSIS — M9901 Segmental and somatic dysfunction of cervical region: Secondary | ICD-10-CM | POA: Diagnosis not present

## 2017-08-29 DIAGNOSIS — F411 Generalized anxiety disorder: Secondary | ICD-10-CM | POA: Diagnosis not present

## 2017-09-02 DIAGNOSIS — F331 Major depressive disorder, recurrent, moderate: Secondary | ICD-10-CM | POA: Diagnosis not present

## 2017-09-09 ENCOUNTER — Telehealth: Payer: Self-pay

## 2017-09-09 NOTE — Telephone Encounter (Signed)
PLEASE NOTE: All timestamps contained within this report are represented as [place] Standard Time. CONFIDENTIALTY NOTICE: This fax transmission is intended only for the addressee. It contains information that is legally privileged, confidential or otherwise protected from use or disclosure. If you are not the intended recipient, you are strictly prohibited from reviewing, disclosing, copying using or disseminating any of this information or taking any action in reliance Mitchell or regarding this information. If you have received this fax in error, please notify us immediately by telephone so that we can arrange for its return to [place]. Phone: [phone], Toll-Free: [phone], Fax: [phone] Page: 1 of 2 Call Id: [id number] [facility] Night - Client TELEPHONE ADVICE RECORD [facility] Patient Name: [name] Gender: Female DOB: [birth year] Age: 42 Y 10 M 20 D Return Phone Number: [phone] (Primary) [place] [address] Client [facility] Night - Client Client Site [facility] - Night Physician Nicki Reaper - NP Contact Type Call Who Is Calling Patient / Member / Family / Caregiver Call Type Triage / Clinical Relationship To Patient Self Return Phone Number [phone] (Primary) Chief Complaint Anxiety and Panic Attack Reason for Call Symptomatic / Request for Health Information Initial Comment Caller states that she was stung by 13 bees. She is a little anxoious Translation No Nurse Assessment Nurse: Riley Lam, RN, [place] Date/Time (Eastern Time): 09/07/2017 4:09:56 PM Confirm and document reason for call. If symptomatic, describe symptoms. ---Caller states she has 13 beestings. She was outside doing yardwork and swiped a bee - ran a little and realized she was being stung. Almost all in legs, thighs and butt (only one Mitchell arm). Yellowjackets. Denies any  other symptoms. Does the patient have any new or worsening symptoms? ---Yes Will a triage be completed? ---Yes Related visit to physician within the last 2 weeks? ---No Does the PT have any chronic conditions? (i.e. diabetes, asthma, etc.) ---No Is the patient pregnant or possibly pregnant? (Ask all females between the ages of 3-55) ---No Is this a behavioral health or substance abuse call? ---No Guidelines Guideline Title Affirmed Question Affirmed Notes Nurse Date/Time (Eastern Time) Bee or Yellow Jacket Sting Normal local reaction to bee, wasp, or yellow jacket sting Jolayne Haines 09/07/2017 4:12:29 PM Disp. Time Lamount Cohen Time) Disposition Final User 09/07/2017 4:22:52 PM Home Care Yes Riley Lam, RN, Elveria Rising Disagree/Comply Comply PLEASE NOTE: All timestamps contained within this report are represented as [place] Standard Time. CONFIDENTIALTY NOTICE: This fax transmission is intended only for the addressee. It contains information that is legally privileged, confidential or otherwise protected from use or disclosure. If you are not the intended recipient, you are strictly prohibited from reviewing, disclosing, copying using or disseminating any of this information or taking any action in reliance Mitchell or regarding this information. If you have received this fax in error, please notify us immediately by telephone so that we can arrange for its return to [place]. Phone: [phone], Toll-Free: [phone], Fax: [phone] Page: 2 of 2 Call Id: [id number] Caller Understands Yes PreDisposition Call another nurse Care Advice Given Per Guideline CARE ADVICE given per Bee or Yellow Jacket Sting (Adult) guideline * Sting begins to look infected. * Swelling becomes huge CALL BACK IF: EXPECTED COURSE: * Pain: Severe pain or burning at the site lasts 1 to 2 hours. Pain after this period is usually minimal. Itching often follows the pain. * Redness and Swelling: Normal redness and  swelling from the venom  can increase for 24 hours following the sting. Redness at the sting site is normal. It doesn't mean that it is infected. The redness can last 3 days and the swelling 7 days. * Stings only rarely get infected. HYDROCORTISONE CREAM FOR ITCHING: CAUTION - ANTIHISTAMINES: * Examples include diphenhydramine (Benadryl) and chlorpheniramine (Chlortrimeton, Chlor-tripolon) OTC ANTIHISTAMINE FOR ITCHING: * If the sting becomes itchy, take a dose of Benadryl (OTC diphenhydramine). * Typical adult dosage is 25-50 mg PO. PAIN MEDICINE: * If needed, take acetaminophen (e.g., Tylenol). Usually, the sting pain should get better over several hours. So you only need to take it once. * Typical adult dose is 650 mg. APPLY LOCAL COLD - COLD PACK METHOD: * Apply this cold pack to the area of the sting for 10-20 minutes. * You may repeat this as needed, to relieve symptoms of pain and swelling. APPLY LOCAL COLD - ICE MASSAGE METHOD * For pain, massage the area of the sting with an ice cube for 10 min prn. HOME CARE: * You should be able to treat this at home. REASSURANCE AND EDUCATION: * It sounds like a routine bee (wasp, yellow jacket) sting that we can treat at home. * The main symptoms are localized pain, swelling, itching, and mild redness at the sting site.

## 2017-09-09 NOTE — Telephone Encounter (Signed)
Was unable to reach pt by phone

## 2017-09-09 NOTE — Telephone Encounter (Signed)
Can we try to reach her again and see how she is doing?

## 2017-09-12 DIAGNOSIS — M5414 Radiculopathy, thoracic region: Secondary | ICD-10-CM | POA: Diagnosis not present

## 2017-09-12 DIAGNOSIS — F411 Generalized anxiety disorder: Secondary | ICD-10-CM | POA: Diagnosis not present

## 2017-09-12 DIAGNOSIS — M9901 Segmental and somatic dysfunction of cervical region: Secondary | ICD-10-CM | POA: Diagnosis not present

## 2017-09-12 DIAGNOSIS — M9902 Segmental and somatic dysfunction of thoracic region: Secondary | ICD-10-CM | POA: Diagnosis not present

## 2017-09-12 DIAGNOSIS — R51 Headache: Secondary | ICD-10-CM | POA: Diagnosis not present

## 2017-09-13 NOTE — Telephone Encounter (Signed)
Spoke to pt and she reports she is doing well although she is still itching but not worse

## 2017-09-19 DIAGNOSIS — F411 Generalized anxiety disorder: Secondary | ICD-10-CM | POA: Diagnosis not present

## 2017-10-03 DIAGNOSIS — F411 Generalized anxiety disorder: Secondary | ICD-10-CM | POA: Diagnosis not present

## 2017-10-10 DIAGNOSIS — R51 Headache: Secondary | ICD-10-CM | POA: Diagnosis not present

## 2017-10-10 DIAGNOSIS — M5414 Radiculopathy, thoracic region: Secondary | ICD-10-CM | POA: Diagnosis not present

## 2017-10-10 DIAGNOSIS — M9902 Segmental and somatic dysfunction of thoracic region: Secondary | ICD-10-CM | POA: Diagnosis not present

## 2017-10-10 DIAGNOSIS — M9901 Segmental and somatic dysfunction of cervical region: Secondary | ICD-10-CM | POA: Diagnosis not present

## 2017-10-14 ENCOUNTER — Ambulatory Visit: Payer: Self-pay | Admitting: Internal Medicine

## 2017-10-14 NOTE — Telephone Encounter (Signed)
Patient called and states she is experiencing dizziness, nausea, vomiting since Aug 10th . She states that it has been off and on. She is requesting a call back. CB# [phone]  Patient is calling to report that she has been experiencing dizziness since 8/10. Patient states she has dizziness with nausea. Patient will need a 30 minute appointment for evaluation- she is scheduled for 9/3- note sent to see if patient appointment can be moved up.  Reason for Disposition . [1] MILD dizziness (e.g., walking normally) AND [2] has NOT been evaluated by physician for this  (Exception: dizziness caused by heat exposure, sudden standing, or poor fluid intake)  Answer Assessment - Initial Assessment Questions 1. DESCRIPTION: "Describe your dizziness."     Nauseating and spinny 2. LIGHTHEADED: "Do you feel lightheaded?" (e.g., somewhat faint, woozy, weak upon standing)     Woozy, somewhat- noise is bothersome, change in position when laying down causes, when passenger in car she feels it 3. VERTIGO: "Do you feel like either you or the room is spinning or tilting?" (i.e. vertigo)     No- feels like it is spinning in head 4. SEVERITY: "How bad is it?"  "Do you feel like you are going to faint?" "Can you stand and walk?"   - MILD - walking normally   - MODERATE - interferes with normal activities (e.g., work, school)    - SEVERE - unable to stand, requires support to walk, feels like passing out now.      mild 5. ONSET:  "When did the dizziness begin?"     8/10- patient went tubes 6. AGGRAVATING FACTORS: "Does anything make it worse?" (e.g., standing, change in head position)     Changes in position 7. HEART RATE: "Can you tell me your heart rate?" "How many beats in 15 seconds?"  (Note: not all patients can do this)       Does not notice nay changes 8. CAUSE: "What do you think is causing the dizziness?"     ? medications 9. RECURRENT SYMPTOM: "Have you had dizziness before?" If so, ask: "When was  the last time?" "What happened that time?"     no 10. OTHER SYMPTOMS: "Do you have any other symptoms?" (e.g., fever, chest pain, vomiting, diarrhea, bleeding)       Loose stool one day, new breast tenderness- more headache 11. PREGNANCY: "Is there any chance you are pregnant?" "When was your last menstrual period?"       No- UPT negative- IUD  Protocols used: DIZZINESS [facility]- LIGHTHEADEDNESS-A-AH

## 2017-10-14 NOTE — Telephone Encounter (Addendum)
Unable to reach pt by phone; left v/m requesting cb. Pt called back and scheduled appt on 10/15/17 at 3:45; see triage note.

## 2017-10-15 ENCOUNTER — Ambulatory Visit: Payer: BLUE CROSS/BLUE SHIELD | Admitting: Internal Medicine

## 2017-10-15 ENCOUNTER — Encounter: Payer: Self-pay | Admitting: Internal Medicine

## 2017-10-15 VITALS — BP 126/84 | HR 71 | Temp 98.4°F | Wt 183.0 lb

## 2017-10-15 DIAGNOSIS — R42 Dizziness and giddiness: Secondary | ICD-10-CM | POA: Diagnosis not present

## 2017-10-15 DIAGNOSIS — G44201 Tension-type headache, unspecified, intractable: Secondary | ICD-10-CM | POA: Diagnosis not present

## 2017-10-15 DIAGNOSIS — R110 Nausea: Secondary | ICD-10-CM | POA: Diagnosis not present

## 2017-10-15 MED ORDER — MECLIZINE HCL 25 MG PO TABS: 25.0000 mg | ORAL_TABLET | Freq: Three times a day (TID) | ORAL | 0 refills | Status: DC | PRN

## 2017-10-15 NOTE — Patient Instructions (Signed)

## 2017-10-15 NOTE — Progress Notes (Signed)
Subjective:    Patient ID: [name], female    DOB: 30-May-1975, 42 y.o.   MRN: [id number]  HPI  Pt presents to the clinic today with c/o headache, dizziness and nausea. She reports this started 2-3 weeks ago. The headaches are located in the base of her skull and radiate to the front of her head. She describes the pain as achy and tight.  She denies visual changes. She describes the dizziness as more lightheadedness, not that the room is spinning. The dizziness has occurred in the car and during workouts. She denies associated chest pain, shortness of breath or syncopal episode. The dizziness doesn't last long but is followed by hours or nausea. She denies vomiting, diarrhea, constipation of blood in her stools. She denies neck pain or injury. She does have some runny nose and ear fullness, but denies sore throat or cough. She has not taken anything OTC for her symptoms.   Review of Systems      Past Medical History:  Diagnosis Date  . Depression   . Diverticulosis 2018  . Frequent headaches   . Heart murmur   . History of chicken pox     Current Outpatient Medications  Medication Sig Dispense Refill  . buPROPion (WELLBUTRIN XL) 150 MG 24 hr tablet Take 450 mg by mouth daily.    Marland Kitchen levonorgestrel (MIRENA) 20 MCG/24HR IUD 1 each by Intrauterine route once.     . Multiple Vitamin (MULTIVITAMIN) tablet Take 1 tablet by mouth daily.    . traZODone (DESYREL) 50 MG tablet take 1/2 to 1 tablet by mouth at bedtime if needed for sleep  0  . zaleplon (SONATA) 10 MG capsule take 1 capsule by mouth at bedtime if needed for sleep and may re...  (REFER TO PRESCRIPTION NOTES).  0   No current facility-administered medications for this visit.     No Known Allergies  Family History  Problem Relation Age of Onset  . Heart disease Mother        Unsure    Social History   Socioeconomic History  . Marital status: Divorced    Spouse name: Not on file  . Number of children: Not on  file  . Years of education: Not on file  . Highest education level: Not on file  Occupational History  . Not on file  Social Needs  . Financial resource strain: Not on file  . Food insecurity:    Worry: Not on file    Inability: Not on file  . Transportation needs:    Medical: Not on file    Non-medical: Not on file  Tobacco Use  . Smoking status: Never Smoker  . Smokeless tobacco: Never Used  Substance and Sexual Activity  . Alcohol use: Yes    Comment: occasional   . Drug use: No  . Sexual activity: Not Currently    Birth control/protection: None  Lifestyle  . Physical activity:    Days per week: Not on file    Minutes per session: Not on file  . Stress: Not on file  Relationships  . Social connections:    Talks on phone: Not on file    Gets together: Not on file    Attends religious service: Not on file    Active member of club or organization: Not on file    Attends meetings of clubs or organizations: Not on file    Relationship status: Not on file  . Intimate partner violence:  Fear of current or ex partner: Not on file    Emotionally abused: Not on file    Physically abused: Not on file    Forced sexual activity: Not on file  Other Topics Concern  . Not on file  Social History Narrative  . Not on file     Constitutional: Pt reports headaches. Denies fever, malaise, fatigue, or abrupt weight changes.  HEENT: Pt reports runny nose, ear fullness. Denies eye pain, eye redness, ear pain, ringing in the ears, wax buildup, nasal congestion, bloody nose, or sore throat. Respiratory: Denies difficulty breathing, shortness of breath, cough or sputum production.   Cardiovascular: Denies chest pain, chest tightness, palpitations or swelling in the hands or feet.  Gastrointestinal: Pt reports nausea. Denies abdominal pain, bloating, constipation, diarrhea or blood in the stool.  GU: Denies urgency, frequency, pain with urination, burning sensation, blood in urine, odor  or discharge. Musculoskeletal: Denies decrease in range of motion, difficulty with gait, muscle pain or joint pain and swelling.  Neurological: Pt reports dizziness. Denies difficulty with memory, difficulty with speech or problems with balance and coordination.    No other specific complaints in a complete review of systems (except as listed in HPI above).  Objective:   Physical Exam   BP 126/84   Pulse 71   Temp 98.4 F (36.9 C) (Oral)   Wt 183 lb (83 kg)   SpO2 98%   BMI 31.41 kg/m  Wt Readings from Last 3 Encounters:  10/15/17 183 lb (83 kg)  06/20/17 183 lb 8 oz (83.2 kg)  03/28/17 180 lb (81.6 kg)    General: Appears her stated age, obese in NAD. SHEENT: Head: normal shape and size, no sinus pressure noted; Eyes: no horizontal nystagmus, PERRLA and EOMs intact; Ears: Tm's gray and intact, normal light reflex;  Neck:  Neck supple, trachea midline. No masses, lumps or thyromegaly present.  Cardiovascular: Normal rate and rhythm. S1,S2 noted.  No murmur, rubs or gallops noted. No JVD or BLE edema. No carotid bruits noted. Pulmonary/Chest: Normal effort and positive vesicular breath sounds. No respiratory distress. No wheezes, rales or ronchi noted.  Abdomen: Soft and nontender. Normal bowel sounds. No distention or masses noted.  Musculoskeletal: Normal flexion, extension and rotation of the cervical spine. No bony tenderness noted over the cervical spine. Neurological: Alert and oriented. Coordination normal.    BMET    Component Value Date/Time   NA 140 06/04/2014 1335   K 4.2 06/04/2014 1335   CL 107 06/04/2014 1335   CO2 27 06/04/2014 1335   GLUCOSE 106 (H) 06/04/2014 1335   BUN 9 06/04/2014 1335   CREATININE 0.60 06/04/2014 1335   CALCIUM 9.4 06/04/2014 1335   GFRNONAA >60 06/04/2014 1335   GFRAA >60 06/04/2014 1335    Lipid Panel  No results found for: CHOL, TRIG, HDL, CHOLHDL, VLDL, LDLCALC  CBC    Component Value Date/Time   WBC 10.8 05/31/2016 1546     RBC 4.45 05/31/2016 1546   HGB 13.3 05/31/2016 1546   HGB 13.7 06/04/2014 1335   HCT 39.6 05/31/2016 1546   HCT 40.6 06/04/2014 1335   PLT 292 05/31/2016 1546   PLT 240 06/04/2014 1335   MCV 88.9 05/31/2016 1546   MCV 91 06/04/2014 1335   MCH 29.9 05/31/2016 1546   MCHC 33.6 05/31/2016 1546   RDW 12.9 05/31/2016 1546   RDW 12.6 06/04/2014 1335   LYMPHSABS 1.3 05/31/2016 1546   LYMPHSABS 2.0 06/04/2014 1335   MONOABS  0.8 05/31/2016 1546   MONOABS 0.5 06/04/2014 1335   EOSABS 0.0 05/31/2016 1546   EOSABS 0.1 06/04/2014 1335   BASOSABS 0.1 05/31/2016 1546   BASOSABS 0.1 06/04/2014 1335    Hgb A1C No results found for: HGBA1C         Assessment & Plan:   Headache, Dizziness, Nausea:  Orthostatics: negative  Will check CBC, CMET, TSH today Encouraged neck exercises, heat and massage Try Tylenol or Excedrin Migraine for headache, avoid NSAID's for now eRx for Meclizine 25 mg TID prn Start Allegra and Flonase OTC for allergy symptoms Encouraged adequate rest and fluid intake  Will follow up after labs. Return/ER precautions discussed Nicki Reaper, NP

## 2017-10-16 LAB — COMPREHENSIVE METABOLIC PANEL
ALBUMIN: 4.5 g/dL (ref 3.5–5.2)
ALT: 14 U/L (ref 0–35)
AST: 13 U/L (ref 0–37)
Alkaline Phosphatase: 63 U/L (ref 39–117)
BUN: 17 mg/dL (ref 6–23)
CHLORIDE: 101 meq/L (ref 96–112)
CO2: 27 meq/L (ref 19–32)
Calcium: 9.6 mg/dL (ref 8.4–10.5)
Creatinine, Ser: 1.09 mg/dL (ref 0.40–1.20)
GFR: 71.14 mL/min (ref >60.00)
Glucose, Bld: 94 mg/dL (ref 70–99)
POTASSIUM: 4.2 meq/L (ref 3.5–5.1)
Sodium: 136 mEq/L (ref 135–145)
Total Bilirubin: 0.3 mg/dL (ref 0.2–1.2)
Total Protein: 7.6 g/dL (ref 6.0–8.3)

## 2017-10-16 LAB — CBC
HEMATOCRIT: 43.1 % (ref 36.0–46.0)
Hemoglobin: 14.8 g/dL (ref 12.0–15.0)
MCHC: 34.3 g/dL (ref 30.0–36.0)
MCV: 95 fl (ref 78.0–100.0)
Platelets: 226 10*3/uL (ref 150.0–400.0)
RBC: 4.53 Mil/uL (ref 3.87–5.11)
RDW: 12.2 % (ref 11.5–15.5)
WBC: 9.7 10*3/uL (ref 4.0–10.5)

## 2017-10-16 LAB — TSH: TSH: 1.78 u[IU]/mL (ref 0.35–4.50)

## 2017-10-17 ENCOUNTER — Encounter: Payer: Self-pay | Admitting: Internal Medicine

## 2017-10-18 ENCOUNTER — Ambulatory Visit (INDEPENDENT_AMBULATORY_CARE_PROVIDER_SITE_OTHER): Payer: BLUE CROSS/BLUE SHIELD | Admitting: Obstetrics and Gynecology

## 2017-10-18 ENCOUNTER — Encounter: Payer: Self-pay | Admitting: Obstetrics and Gynecology

## 2017-10-18 VITALS — BP 154/86 | HR 72 | Wt 183.0 lb

## 2017-10-18 DIAGNOSIS — Z0000 Encounter for general adult medical examination without abnormal findings: Secondary | ICD-10-CM

## 2017-10-18 DIAGNOSIS — Z3202 Encounter for pregnancy test, result negative: Secondary | ICD-10-CM

## 2017-10-18 DIAGNOSIS — Z30432 Encounter for removal of intrauterine contraceptive device: Secondary | ICD-10-CM

## 2017-10-18 LAB — POCT URINE PREGNANCY: Preg Test, Ur: NEGATIVE

## 2017-10-18 NOTE — Progress Notes (Signed)
Obstetrics and Gynecology Annual Patient Evaluation  Appointment Date: 10/18/2017  OBGYN Clinic: [facility]  Primary Care Provider: Lorre Munroe  Referring Provider: Lorre Munroe, NP  Chief Complaint:  Chief Complaint  Patient presents with  . Gynecologic Exam  . removal IUD    History of Present Illness: [name] is a 42 y.o. African-American G2P1011 (No LMP recorded. (Menstrual status: IUD).), seen for the above chief complaint. Her past medical history is significant for HTN  Patient would like her Mirena IUD out. She has had about a one month history of feeling tired, dizzy and some breast soreness. Pt seen by PCP. Patient wondering if s/s could be from the Mirena. Patient not on new meds around this time.   Patient also wondering about chances of getting pregnant.    No breast s/s, fevers, chills, nausea, vomiting, abdominal pain, dysuria, hematuria, vaginal itching, diarrhea, constipation  Review of Systems:  as noted in the History of Present Illness.  Patient Active Problem List   Diagnosis Date Noted  . Cough, persistent 06/20/2017  . Depression, recurrent (HCC) 10/18/2016  . Insomnia 10/18/2016  . Frequent headaches 10/18/2016    Past Medical History:  Past Medical History:  Diagnosis Date  . Depression   . Diverticulosis 2018  . Frequent headaches   . Heart murmur   . History of chicken pox   . Hypertension     Past Surgical History:  Past Surgical History:  Procedure Laterality Date  . COLONOSCOPY WITH PROPOFOL N/A 11/28/2016   Procedure: COLONOSCOPY WITH PROPOFOL;  Surgeon: Earline Mayotte, MD;  Location: [facility] ENDOSCOPY;  Service: Endoscopy;  Laterality: N/A;  . EXCISIONAL HEMORRHOIDECTOMY  2014    Past Obstetrical History:  OB History  Gravida Para Term Preterm AB Living  2 1 1   1 1   SAB TAB Ectopic Multiple Live Births  1       1    # Outcome Date GA Lbr Len/2nd Weight Sex Delivery Anes PTL Lv  2  Term 11/30/03 [redacted]w[redacted]d  8 lb (3.629 kg) M Vag-Spont   LIV  1 SAB  [redacted]w[redacted]d           Obstetric Comments  1st Menstrual Cycle:  13   1st Pregnancy:  27    Past Gynecological History: As per HPI. Periods: qmonth, approx 5d, light, not heavy History of Pap Smear(s): Yes.   Last pap 2017, which was NILM/HPV neg She is currently using Mirena for contraception. Placed 01/2016  Social History:  Social History   Socioeconomic History  . Marital status: Divorced    Spouse name: Not on file  . Number of children: Not on file  . Years of education: Not on file  . Highest education level: Not on file  Occupational History  . Not on file  Social Needs  . Financial resource strain: Not on file  . Food insecurity:    Worry: Not on file    Inability: Not on file  . Transportation needs:    Medical: Not on file    Non-medical: Not on file  Tobacco Use  . Smoking status: Never Smoker  . Smokeless tobacco: Never Used  Substance and Sexual Activity  . Alcohol use: Yes    Comment: occasional   . Drug use: No  . Sexual activity: Not Currently    Birth control/protection: None  Lifestyle  . Physical activity:    Days per week: Not on file    Minutes  per session: Not on file  . Stress: Not on file  Relationships  . Social connections:    Talks on phone: Not on file    Gets together: Not on file    Attends religious service: Not on file    Active member of club or organization: Not on file    Attends meetings of clubs or organizations: Not on file    Relationship status: Not on file  . Intimate partner violence:    Fear of current or ex partner: Not on file    Emotionally abused: Not on file    Physically abused: Not on file    Forced sexual activity: Not on file  Other Topics Concern  . Not on file  Social History Narrative  . Not on file    Family History:  Family History  Problem Relation Age of Onset  . Heart disease Mother        Posey Rea   She denies any female  cancers  Health Maintenance:  Mammogram(s): None  Medications Geovana Gebel Horton had no medications administered during this visit. Current Outpatient Medications  Medication Sig Dispense Refill  . buPROPion (WELLBUTRIN XL) 150 MG 24 hr tablet Take 450 mg by mouth daily.    Marland Kitchen levonorgestrel (MIRENA) 20 MCG/24HR IUD 1 each by Intrauterine route once.     . meclizine (ANTIVERT) 25 MG tablet Take 1 tablet (25 mg total) by mouth 3 (three) times daily as needed for dizziness. (Patient not taking: Reported on 10/18/2017) 30 tablet 0  . Multiple Vitamin (MULTIVITAMIN) tablet Take 1 tablet by mouth daily.    . traZODone (DESYREL) 50 MG tablet take 1/2 to 1 tablet by mouth at bedtime if needed for sleep  0  . zaleplon (SONATA) 10 MG capsule take 1 capsule by mouth at bedtime if needed for sleep and may re...  (REFER TO PRESCRIPTION NOTES).  0   No current facility-administered medications for this visit.     Allergies Patient has no known allergies.   Physical Exam:  BP (!) 154/86   Pulse 72   Wt 183 lb (83 kg)   BMI 31.41 kg/m  Body mass index is 31.41 kg/m. General appearance: Well nourished, well developed female in no acute distress.  Neck:  Supple, normal appearance, and no thyromegaly  Cardiovascular: normal s1 and s2.  No murmurs, rubs or gallops. Respiratory:  Clear to auscultation bilateral. Normal respiratory effort Abdomen: positive bowel sounds and no masses, hernias; diffusely non tender to palpation, non distended Breasts: breasts appear normal, no suspicious masses, no skin or nipple changes or axillary nodes, normal palpation. Neuro/Psych:  Normal mood and affect.  Skin:  Warm and dry.  Lymphatic:  No inguinal lymphadenopathy.   Pelvic exam: is not limited by body habitus EGBUS: within normal limits, Vagina: within normal limits and with no blood or discharge in the vault, Cervix: normal appearing cervix without tenderness, discharge or lesions. IUD strings seen  approx 3-4cm-->see note for uncomplicated IUD removal Uterus:  nonenlarged and non tender and Adnexa:  normal adnexa Rectovaginal: deferred  Laboratory: UPT negative  Radiology: none  Assessment: pt doing well  Plan:  1. Routine adult health maintenance Routine care. Pt amenable to mammo screening - POCT urine pregnancy - MM 3D SCREEN BREAST BILATERAL; Future  2. Encounter for IUD removal I told her that s/s are most likely not related to her Mirena but she'd still like it out. Uncomplicated removal. D/w her re: potential issues with ama and  pregnancy. Pt already on a woman's once a day MVI.  RTC 1 year  [place]way Bingharlie Chananya Canizalez, Montez HagemanJr MD Attending [facility](Faculty Practice) Laney PastorQmonth, 5d, light  Lightheaded, tired. Breast sore one month

## 2017-10-18 NOTE — Procedures (Signed)
Intrauterine Device (IUD) Removal Procedure Note  Mirena IUD placed late 2017. Patient desires removal b/c may want to get pregnant and to see if it will help with lethargy s/s.  Prior to the procedure being performed, the patient (or guardian) was asked to state their full name, date of birth, and the type of procedure being performed. EGBUS normal. Vaginal vault normal. Cervix normal with IUD strings seen (approx 3-4cm in length). Mirena strings grasped with ringed forceps and easily removed and noted to be intact.   No complications, patient tolerated the procedure well.  Cornelia Copaharlie Crystelle Ferrufino, Jr MD Attending [facility] 10/18/2017

## 2017-10-22 ENCOUNTER — Ambulatory Visit: Payer: BLUE CROSS/BLUE SHIELD | Admitting: Internal Medicine

## 2017-10-31 DIAGNOSIS — Z1231 Encounter for screening mammogram for malignant neoplasm of breast: Secondary | ICD-10-CM | POA: Diagnosis not present

## 2017-10-31 DIAGNOSIS — F411 Generalized anxiety disorder: Secondary | ICD-10-CM | POA: Diagnosis not present

## 2017-11-01 ENCOUNTER — Encounter: Payer: Self-pay | Admitting: Radiology

## 2017-11-07 DIAGNOSIS — M9902 Segmental and somatic dysfunction of thoracic region: Secondary | ICD-10-CM | POA: Diagnosis not present

## 2017-11-07 DIAGNOSIS — R51 Headache: Secondary | ICD-10-CM | POA: Diagnosis not present

## 2017-11-07 DIAGNOSIS — M9901 Segmental and somatic dysfunction of cervical region: Secondary | ICD-10-CM | POA: Diagnosis not present

## 2017-11-07 DIAGNOSIS — M5414 Radiculopathy, thoracic region: Secondary | ICD-10-CM | POA: Diagnosis not present

## 2017-11-07 DIAGNOSIS — F411 Generalized anxiety disorder: Secondary | ICD-10-CM | POA: Diagnosis not present

## 2017-11-14 DIAGNOSIS — F411 Generalized anxiety disorder: Secondary | ICD-10-CM | POA: Diagnosis not present

## 2017-11-21 DIAGNOSIS — F411 Generalized anxiety disorder: Secondary | ICD-10-CM | POA: Diagnosis not present

## 2017-11-22 ENCOUNTER — Telehealth: Payer: Self-pay

## 2017-11-22 NOTE — Telephone Encounter (Signed)
Per provider - called patient to advise lab work was needed. Patient reports that her menstrual cycle did start this morning - she will keep records of her cycle - symptoms, flow and length.   I advised patient that I would forward this message to see if the provider still wanted her to come in for labs. Patient stated she understood.

## 2017-11-25 ENCOUNTER — Telehealth: Payer: Self-pay | Admitting: Radiology

## 2017-11-25 NOTE — Telephone Encounter (Signed)
Left message for patient to call and schedule appointment to come to office to draw lab per Dr Quin Hoop request.

## 2017-11-28 ENCOUNTER — Other Ambulatory Visit: Payer: BLUE CROSS/BLUE SHIELD

## 2017-11-28 DIAGNOSIS — N926 Irregular menstruation, unspecified: Secondary | ICD-10-CM | POA: Diagnosis not present

## 2017-11-28 DIAGNOSIS — F411 Generalized anxiety disorder: Secondary | ICD-10-CM | POA: Diagnosis not present

## 2017-11-29 LAB — BETA HCG QUANT (REF LAB): hCG Quant: <1 m[IU]/mL

## 2017-12-12 DIAGNOSIS — M9902 Segmental and somatic dysfunction of thoracic region: Secondary | ICD-10-CM | POA: Diagnosis not present

## 2017-12-12 DIAGNOSIS — M5414 Radiculopathy, thoracic region: Secondary | ICD-10-CM | POA: Diagnosis not present

## 2017-12-12 DIAGNOSIS — F411 Generalized anxiety disorder: Secondary | ICD-10-CM | POA: Diagnosis not present

## 2017-12-12 DIAGNOSIS — R51 Headache: Secondary | ICD-10-CM | POA: Diagnosis not present

## 2017-12-12 DIAGNOSIS — M9901 Segmental and somatic dysfunction of cervical region: Secondary | ICD-10-CM | POA: Diagnosis not present

## 2017-12-19 DIAGNOSIS — F411 Generalized anxiety disorder: Secondary | ICD-10-CM | POA: Diagnosis not present

## 2017-12-26 DIAGNOSIS — F411 Generalized anxiety disorder: Secondary | ICD-10-CM | POA: Diagnosis not present

## 2018-01-02 DIAGNOSIS — F411 Generalized anxiety disorder: Secondary | ICD-10-CM | POA: Diagnosis not present

## 2018-01-09 DIAGNOSIS — R51 Headache: Secondary | ICD-10-CM | POA: Diagnosis not present

## 2018-01-09 DIAGNOSIS — M9901 Segmental and somatic dysfunction of cervical region: Secondary | ICD-10-CM | POA: Diagnosis not present

## 2018-01-09 DIAGNOSIS — M9902 Segmental and somatic dysfunction of thoracic region: Secondary | ICD-10-CM | POA: Diagnosis not present

## 2018-01-09 DIAGNOSIS — M62830 Muscle spasm of back: Secondary | ICD-10-CM | POA: Diagnosis not present

## 2018-01-09 DIAGNOSIS — F411 Generalized anxiety disorder: Secondary | ICD-10-CM | POA: Diagnosis not present

## 2018-01-23 ENCOUNTER — Ambulatory Visit: Payer: BLUE CROSS/BLUE SHIELD | Admitting: Internal Medicine

## 2018-01-23 DIAGNOSIS — F411 Generalized anxiety disorder: Secondary | ICD-10-CM | POA: Diagnosis not present

## 2018-01-30 ENCOUNTER — Ambulatory Visit: Payer: BLUE CROSS/BLUE SHIELD | Admitting: Obstetrics & Gynecology

## 2018-01-30 ENCOUNTER — Ambulatory Visit: Payer: BLUE CROSS/BLUE SHIELD | Admitting: General Surgery

## 2018-01-30 ENCOUNTER — Encounter: Payer: Self-pay | Admitting: General Surgery

## 2018-01-30 ENCOUNTER — Other Ambulatory Visit: Payer: Self-pay

## 2018-01-30 VITALS — BP 147/96 | HR 78 | Temp 97.5°F | Resp 18 | Ht 64.0 in | Wt 187.0 lb

## 2018-01-30 VITALS — BP 129/87 | HR 72 | Wt 186.4 lb

## 2018-01-30 DIAGNOSIS — R109 Unspecified abdominal pain: Secondary | ICD-10-CM | POA: Diagnosis not present

## 2018-01-30 DIAGNOSIS — N9089 Other specified noninflammatory disorders of vulva and perineum: Secondary | ICD-10-CM | POA: Diagnosis not present

## 2018-01-30 DIAGNOSIS — F411 Generalized anxiety disorder: Secondary | ICD-10-CM | POA: Diagnosis not present

## 2018-01-30 MED ORDER — VALACYCLOVIR HCL 1 G PO TABS: 1000.0000 mg | ORAL_TABLET | Freq: Two times a day (BID) | ORAL | 6 refills | Status: AC

## 2018-01-30 NOTE — Progress Notes (Signed)
Noticed bump on vaginal area about a week ago. Had some drainage last night.

## 2018-01-30 NOTE — Progress Notes (Signed)
Patient ID: [name], female   DOB: April 29, 1975, 42 y.o.   MRN: [id number]  No chief complaint on file.   HPI [name] is a 42 y.o. female.   She is here because she has a left labial lesion which started  6 days ago. It ruptured yesterday and some off white "two little small balls of a waxy substance" came out.    Past Medical History:  Diagnosis Date  . Depression   . Diverticulosis 2018  . Frequent headaches   . Heart murmur   . History of chicken pox   . Hypertension     Past Surgical History:  Procedure Laterality Date  . COLONOSCOPY WITH PROPOFOL N/A 11/28/2016   Procedure: COLONOSCOPY WITH PROPOFOL;  Surgeon: Earline MayotteByrnett, Jeffrey W, MD;  Location: [facility] END[facility]COPY;  Service: Endoscopy;  Laterality: N/A;  . EXCISIONAL HEMORRHOIDECTOMY  2014    Family History  Problem Relation Age of Onset  . Heart disease Mother        Unsure    Social History Social History   Tobacco Use  . Smoking status: Never Smoker  . Smokeless tobacco: Never Used  Substance Use Topics  . Alcohol use: Yes    Comment: occasional   . Drug use: No    No Known Allergies  Current Outpatient Medications  Medication Sig Dispense Refill  . buPROPion (WELLBUTRIN XL) 150 MG 24 hr tablet Take 450 mg by mouth daily.    . meclizine (ANTIVERT) 25 MG tablet Take 1 tablet (25 mg total) by mouth 3 (three) times daily as needed for dizziness. (Patient not taking: Reported on 10/18/2017) 30 tablet 0  . Multiple Vitamin (MULTIVITAMIN) tablet Take 1 tablet by mouth daily.    . traZODone (DESYREL) 50 MG tablet take 1/2 to 1 tablet by mouth at bedtime if needed for sleep  0  . zaleplon (SONATA) 10 MG capsule take 1 capsule by mouth at bedtime if needed for sleep and may re...  (REFER TO PRESCRIPTION NOTES).  0   No current facility-administered medications for this visit.     Review of Systems Review of Systems  Blood pressure 129/87, pulse 72, weight 186 lb 6.4 oz (84.6 kg).  Physical  Exam Physical Exam Breathing, conversing, and ambulating normally Left labia with a small sebaceous cyst near the base and a 1 cm ulceration at the mid level of the labia majora  Data Reviewed Negative for HSV2 and + for 1 on 2/19  Assessment    HSV lesion    Plan    Treat with valtrex on a prn basis       Chay Mazzoni C Lorin Hauck 01/30/2018, 8:51 AM

## 2018-01-30 NOTE — Progress Notes (Signed)
Patient ID: [name], female   DOB: [birth year], 42 y.o.   MRN: [id number]  Chief Complaint  Patient presents with  . Follow-up         HPI [name] is a 42 y.o. female here today to follow up for her diverticulitis states she has had a few flare ups which started the week of Veterans day.  Beginning about 6 weeks earlier she had discontinued her daily routine of having a salad, and since then has had a change in her stool habits.  She does state that she has some minor left sided pain. Patient has had nausea, constipation, cramps, feeling bloated and gassy and some heart burn.    HPI  Past Medical History:  Diagnosis Date  . Depression   . Diverticulosis 2018  . Frequent headaches   . Heart murmur   . History of chicken pox   . Hypertension     Past Surgical History:  Procedure Laterality Date  . COLONOSCOPY WITH PROPOFOL N/A 11/28/2016   Procedure: COLONOSCOPY WITH PROPOFOL;  Surgeon: Earline Mayotte, MD;  Location: [facility] END[facility]COPY;  Service: Endoscopy;  Laterality: N/A;  . EXCISIONAL HEMORRHOIDECTOMY  2014    Family History  Problem Relation Age of Onset  . Heart disease Mother        Unsure    Social History Social History   Tobacco Use  . Smoking status: Never Smoker  . Smokeless tobacco: Never Used  Substance Use Topics  . Alcohol use: Yes    Comment: occasional   . Drug use: No    No Known Allergies  Current Outpatient Medications  Medication Sig Dispense Refill  . buPROPion (WELLBUTRIN XL) 150 MG 24 hr tablet Take 450 mg by mouth daily.    . meclizine (ANTIVERT) 25 MG tablet Take 1 tablet (25 mg total) by mouth 3 (three) times daily as needed for dizziness. 30 tablet 0  . Multiple Vitamin (MULTIVITAMIN) tablet Take 1 tablet by mouth daily.    . traZODone (DESYREL) 50 MG tablet take 1/2 to 1 tablet by mouth at bedtime if needed for sleep  0  . zaleplon (SONATA) 10 MG capsule take 1 capsule by mouth at bedtime if needed for sleep and  may re...  (REFER TO PRESCRIPTION NOTES).  0   No current facility-administered medications for this visit.     Review of Systems Review of Systems  Constitutional: Negative.   Respiratory: Negative.   Cardiovascular: Negative.     Blood pressure (!) 147/96, pulse 78, temperature (!) 97.5 F (36.4 C), temperature source Temporal, resp. rate 18, height 5\' 4"  (1.626 m), weight 187 lb (84.8 kg), SpO2 98 %.  Physical Exam Physical Exam Constitutional:      Appearance: She is well-developed.  Eyes:     General: No scleral icterus.    Conjunctiva/sclera: Conjunctivae normal.  Neck:     Musculoskeletal: Normal range of motion.  Cardiovascular:     Rate and Rhythm: Normal rate and regular rhythm.     Heart sounds: Normal heart sounds.  Pulmonary:     Effort: Pulmonary effort is normal.     Breath sounds: Normal breath sounds.  Lymphadenopathy:     Cervical: No cervical adenopathy.  Skin:    General: Skin is warm and dry.  Neurological:     Mental Status: She is alert and oriented to person, place, and time.     Data Reviewed CT of the abdomen completed in April 2018 showed resolution  of the inflammatory process near the cecum and ileocecal valve and evidence of diverticuli in the left side of the colon.  Colonoscopy completed on November 28, 2016 showed evidence of diverticuli within the proximal transverse colon and sigmoid, but no diverticuli in the ascending colon.  Assessment    Past history of right-sided abscess thought secondary to diverticuli, intermittent abdominal pain with decreased dietary fiber..    Plan   I think the patient will benefit from making an addition of regular fiber supplement to her diet.  This can need to be a tablespoon of 1 of the various powders such as Metamucil, Benefiber or Citrucel, tablets of FiberCon or Mehta wafers on a twice daily basis.    Patient is to return to the office as needed.  HPI, Physical Exam, Assessment and Plan have been  scribed under the direction and in the presence of Donnalee CurryJeffrey Dariyah Garduno, Md.  Buddy DutyRanda R. Rubye Oaksickerson, CMA  I have completed the exam and reviewed the above documentation for accuracy and completeness.  I agree with the above.  Museum/gallery conservatorDragon Technology has been used and any errors in dictation or transcription are unintentional.  Donnalee CurryJeffrey Brienne Liguori, M.D., F.A.C.S.  Merrily PewJeffrey W Ambrie Carte 02/01/2018, 9:54 AM

## 2018-01-30 NOTE — Patient Instructions (Addendum)
Patient is to return to the office as needed. Add fiber to your diet.  Call the office with any questions or concerns.  Fiber Content in Foods See the following list for the dietary fiber content of some common foods. High-fiber foods High-fiber foods contain 4 grams or more (4g or more) of fiber per serving. They include:  Artichoke (fresh) - 1 medium has 10.3g of fiber.  Baked beans, plain or vegetarian (canned) -  cup has 5.2g of fiber.  Blackberries or raspberries (fresh) -  cup has 4g of fiber.  Bran cereal -  cup has 8.6g of fiber.  Bulgur (cooked) -  cup has 4g of fiber.  Kidney beans (canned) -  cup has 6.8g of fiber.  Lentils (cooked) -  cup has 7.8g of fiber.  Pear (fresh) - 1 medium has 5.1g of fiber.  Peas (frozen) -  cup has 4.4g of fiber.  Pinto beans (canned) -  cup has 5.5g of fiber.  Pinto beans (dried and cooked) -  cup has 7.7g of fiber.  Potato with skin (baked) - 1 medium has 4.4g of fiber.  Quinoa (cooked) -  cup has 5g of fiber.  Soybeans (canned, frozen, or fresh) -  cup has 5.1g of fiber.  Moderate-fiber foods Moderate-fiber foods contain 1-4 grams (1-4g) of fiber per serving. They include:  Almonds - 1 oz. has 3.5g of fiber.  Apple with skin - 1 medium has 3.3g of fiber.  Applesauce, sweetened -  cup has 1.5g of fiber.  Bagel, plain - one 4-inch (10-cm) bagel has 2g of fiber.  Banana - 1 medium has 3.1g of fiber.  Broccoli (cooked) -  cup has 2.5g of fiber.  Carrots (cooked) -  cup has 2.3g of fiber.  Corn (canned or frozen) -  cup has 2.1g of fiber.  Corn tortilla - one 6-inch (15-cm) tortilla has 1.5g of fiber.  Green beans (canned) -  cup has 2g of fiber.  Instant oatmeal -  cup has about 2g of fiber.  Long-grain brown rice (cooked) - 1 cup has 3.5g of fiber.  Macaroni, enriched (cooked) - 1 cup has 2.5g of fiber.  Melon - 1 cup has 1.4g of fiber.  Multigrain cereal -  cup has about 2-4g of  fiber.  Orange - 1 small has 3.1g of fiber.  Potatoes, mashed -  cup has 1.6g of fiber.  Raisins - 1/4 cup has 1.6g of fiber.  Squash -  cup has 2.9g of fiber.  Sunflower seeds -  cup has 1.1g of fiber.  Tomato - 1 medium has 1.5g of fiber.  Vegetable or soy patty - 1 has 3.4g of fiber.  Whole-wheat bread - 1 slice has 2g of fiber.  Whole-wheat spaghetti -  cup has 3.2g of fiber.  Low-fiber foods Low-fiber foods contain less than 1 gram (less than 1g) of fiber per serving. They include:  Egg - 1 large.  Flour tortilla - one 6-inch (15-cm) tortilla.  Fruit juice -  cup.  Lettuce - 1 cup.  Meat, poultry, or fish - 1 oz.  Milk - 1 cup.  Spinach (raw) - 1 cup.  White bread - 1 slice.  White rice -  cup.  Yogurt -  cup.  Actual amounts of fiber in foods may be different depending on processing. Talk with your dietitian about how much fiber you need in your diet. This information is not intended to replace advice given to you by your health care provider. Make  sure you discuss any questions you have with your health care provider. Document Released: 06/24/2006 Document Revised: 07/14/2015 Document Reviewed: 03/31/2015 Elsevier Interactive Patient Education  2018 Elsevier Inc.    Psyllium Chewable bars or wafers What is this medicine? PSYLLIUM (SIL i yum) is a bulk-forming fiber laxative. This medicine is used to treat constipation. Increasing fiber in the diet may also help lower cholesterol and promote heart health for some people. This medicine may be used for other purposes; ask your health care provider or pharmacist if you have questions. COMMON BRAND NAME(S): Metamucil What should I tell my health care provider before I take this medicine? They need to know if you have any of these conditions: -change in bowel habits for more than 14 days -blocked intestines or bowel -stomach pain, nausea, or vomiting -trouble swallowing -an unusual or allergic  reaction to psyllium, other medicines, dyes, or preservatives -pregnant or trying or get pregnant -breast-feeding How should I use this medicine? Take this medicine by mouth with a full glass of water. Follow the directions on the package labeling, or take as directed by your health care professional. Take your medicine at regular intervals. Do not take your medicine more often than directed. Talk to your pediatrician regarding the use of this medicine in children. While this drug may be prescribed for children as young as 57 years old for selected conditions, precautions do apply. Overdosage: If you think you have taken too much of this medicine contact a poison control center or emergency room at once. NOTE: This medicine is only for you. Do not share this medicine with others. What if I miss a dose? If you miss a dose, take it as soon as you can. If it is almost time for your next dose, take only that dose. Do not take double or extra doses. What may interact with this medicine? Interactions are not expected. This list may not describe all possible interactions. Give your health care provider a list of all the medicines, herbs, non-prescription drugs, or dietary supplements you use. Also tell them if you smoke, drink alcohol, or use illegal drugs. Some items may interact with your medicine. What should I watch for while using this medicine? This medicine can take up to 3 days to work. Check with your doctor or health care professional if your symptoms do not start to get better or if they get worse. See your doctor if you have to treat your constipation for more than 1 week. Avoid taking other medicines within 2 hours of taking this medicine. Drink several glasses of water a day while you are taking this medicine. This will help to relieve constipation and prevent dehydration. What side effects may I notice from receiving this medicine? Side effects that you should report to your doctor or health  care professional as soon as possible: -allergic reactions like skin rash, itching or hives, swelling of the face, lips, or tongue -breathing problems -chest pain -nausea, vomiting -rectal bleeding -trouble swallowing Side effects that usually do not require medical attention (report to your doctor or health care professional if they continue or are bothersome): -bloated or 'gassy' feeling -diarrhea -headache -stomach cramps This list may not describe all possible side effects. Call your doctor for medical advice about side effects. You may report side effects to FDA at 1-800-FDA-1088. Where should I keep my medicine? Keep out of the reach of children. Store at room temperature between 15 and 30 degrees C (59 and 86 degrees F). Protect  from moisture. Throw away any unused medicine after the expiration date. NOTE: This sheet is a summary. It may not cover all possible information. If you have questions about this medicine, talk to your doctor, pharmacist, or health care provider.  2018 Elsevier/Gold Standard (2014-12-15 08:34:33)

## 2018-02-01 DIAGNOSIS — R109 Unspecified abdominal pain: Secondary | ICD-10-CM | POA: Insufficient documentation

## 2018-02-06 DIAGNOSIS — M9901 Segmental and somatic dysfunction of cervical region: Secondary | ICD-10-CM | POA: Diagnosis not present

## 2018-02-06 DIAGNOSIS — M9902 Segmental and somatic dysfunction of thoracic region: Secondary | ICD-10-CM | POA: Diagnosis not present

## 2018-02-06 DIAGNOSIS — R51 Headache: Secondary | ICD-10-CM | POA: Diagnosis not present

## 2018-02-06 DIAGNOSIS — F411 Generalized anxiety disorder: Secondary | ICD-10-CM | POA: Diagnosis not present

## 2018-02-06 DIAGNOSIS — M62830 Muscle spasm of back: Secondary | ICD-10-CM | POA: Diagnosis not present

## 2018-02-17 ENCOUNTER — Ambulatory Visit: Payer: BLUE CROSS/BLUE SHIELD | Admitting: Physician Assistant

## 2018-02-17 ENCOUNTER — Encounter: Payer: Self-pay | Admitting: Physician Assistant

## 2018-02-17 DIAGNOSIS — F331 Major depressive disorder, recurrent, moderate: Secondary | ICD-10-CM

## 2018-02-17 DIAGNOSIS — G4700 Insomnia, unspecified: Secondary | ICD-10-CM

## 2018-02-17 NOTE — Progress Notes (Signed)
[facility] Check  Patient ID: [name],  MRN: [id number]  PCP: Lorre MunroeBaity, Regina W, NP  Date of Evaluation: 02/17/2018 Time spent:15 minutes  Chief Complaint:  Chief Complaint    Follow-up      HISTORY/CURRENT STATUS: HPI here for 3474-month med check.  States she has been doing well and wanting to go off of the Wellbutrin.  She is weaned herself off over the course of 1 month.  She feels fine and has had no withdrawals.  Within the past couple of weeks was her last Wellbutrin.  At this point, she feels no depressive symptoms.Patient denies loss of interest in usual activities and is able to enjoy things.  Denies decreased energy or motivation.  Appetite has not changed.  No extreme sadness, tearfulness, or feelings of hopelessness.  Denies any changes in concentration, making decisions or remembering things.  Denies suicidal or homicidal thoughts.  There are times when she will want to sit on the couch and not do much, but she has the choice to push herself to get up and do things.  In the past, when she was more depressed, she did not have that choice.  She just was not able to muster up the energy to do anything.  She sleeps well.  Work is going well and she is super busy.  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  She has a couple of clients who are using a new FDA approved treatment called Cervella, which is a cranial electrotherapy stimulator.  She has questions about that to see if it may be an option for her to help prevent depression in the future.  Patient denies any seizure disorder, head injury, or any metal implants etc. in the cranium.  Individual Medical History/ Review of Systems: Changes? :No   Allergies: Patient has no known allergies.  Current Medications:  Current Outpatient Medications:  Marland Kitchen.  Multiple Vitamin (MULTIVITAMIN) tablet, Take 1 tablet by mouth daily., Disp: , Rfl:  Medication Side Effects: none  Family  Medical/ Social History: Changes? No  MENTAL HEALTH EXAM:  There were no vitals taken for this visit.There is no height or weight on file to calculate BMI.  General Appearance: Casual and Well Groomed  Eye Contact:  Good  Speech:  Clear and Coherent  Volume:  Normal  Mood:  Euthymic  Affect:  Appropriate  Thought Process:  Goal Directed  Orientation:  Full (Time, Place, and Person)  Thought Content: Logical   Suicidal Thoughts:  No  Homicidal Thoughts:  No  Memory:  WNL  Judgement:  Good  Insight:  Good  Psychomotor Activity:  Normal  Concentration:  Concentration: Good  Recall:  Good  Fund of Knowledge: Good  Language: Good  Assets:  Desire for Improvement  ADL's:  Intact  Cognition: WNL  Prognosis:  Good    DIAGNOSES:    ICD-10-CM   1. Major depressive disorder, recurrent episode, moderate (HCC) F33.1   2. Insomnia, unspecified type G47.00     Receiving Psychotherapy: Yes    RECOMMENDATIONS: As far as an antidepressant goes, I think she is okay not to take anything right now.  She certainly knows any signs and symptoms to watch for indicating that the depression is worsening.  A big side for her will be extremely decreased energy and no motivation at all.  She understands and will contact our office immediately if she starts noticing those symptoms for any length  of time. Not familiar with this new treatment, Cervella, but will do some research on it.  If it is an appropriate treatment and she wants to try, I will recommend and give a prescription as needed. Return in 6 months or sooner as needed.  Melony Overlyeresa Lucero Auzenne, PA-C

## 2018-02-20 ENCOUNTER — Telehealth: Payer: Self-pay

## 2018-02-20 NOTE — Telephone Encounter (Signed)
-----   Message from Cherie Ouch, PA-C sent at 02/18/2018 12:30 PM EST ----- Regarding: Cervella Please let her know I've discussed with Dr. Jennelle Human about the [facility] cranial stimulator, for depression. He states there are no risks of harmful side effects or anything from using it, and it may be helpful.  So if she wants to use it, it's okay.  If she needs an order/Rx from me or anything, let me know and I'll give her whatever she needs.

## 2018-02-27 DIAGNOSIS — F411 Generalized anxiety disorder: Secondary | ICD-10-CM | POA: Diagnosis not present

## 2018-03-04 ENCOUNTER — Telehealth: Payer: Self-pay

## 2018-03-04 NOTE — Telephone Encounter (Signed)
Tried to contact pt a few times with message from provider on the Cervella. No answer and voice mail is full unable to leave message.

## 2018-03-04 NOTE — Telephone Encounter (Signed)
-----   Message from Teresa T Hurst, PA-C sent at 02/18/2018 12:30 PM EST ----- Regarding: [name] Please let her know I've discussed with Dr. Cottle about the [name] cranial stimulator, for depression. He states there are no risks of harmful side effects or anything from using it, and it may be helpful.  So if she wants to use it, it's okay.  If she needs an order/Rx from me or anything, let me know and I'll give her whatever she needs.  

## 2018-03-05 ENCOUNTER — Telehealth: Payer: Self-pay

## 2018-03-05 NOTE — Telephone Encounter (Signed)
-----   Message from Teresa T Hurst, PA-C sent at 02/18/2018 12:30 PM EST ----- Regarding: Cervella Please let her know I've discussed with Dr. Cottle about the Cervella cranial stimulator, for depression. He states there are no risks of harmful side effects or anything from using it, and it may be helpful.  So if she wants to use it, it's okay.  If she needs an order/Rx from me or anything, let me know and I'll give her whatever she needs.  

## 2018-03-05 NOTE — Telephone Encounter (Signed)
Made pt aware of discussion with providers concerning the Cervella cranial stimulator. Pt states there is a form to fill out and instructed her to fax it to the office to be completed.

## 2018-03-06 DIAGNOSIS — M9901 Segmental and somatic dysfunction of cervical region: Secondary | ICD-10-CM | POA: Diagnosis not present

## 2018-03-06 DIAGNOSIS — R51 Headache: Secondary | ICD-10-CM | POA: Diagnosis not present

## 2018-03-06 DIAGNOSIS — M62830 Muscle spasm of back: Secondary | ICD-10-CM | POA: Diagnosis not present

## 2018-03-06 DIAGNOSIS — M9902 Segmental and somatic dysfunction of thoracic region: Secondary | ICD-10-CM | POA: Diagnosis not present

## 2018-03-06 DIAGNOSIS — F411 Generalized anxiety disorder: Secondary | ICD-10-CM | POA: Diagnosis not present

## 2018-03-19 DIAGNOSIS — J029 Acute pharyngitis, unspecified: Secondary | ICD-10-CM | POA: Diagnosis not present

## 2018-03-20 DIAGNOSIS — F411 Generalized anxiety disorder: Secondary | ICD-10-CM | POA: Diagnosis not present

## 2018-03-27 DIAGNOSIS — F411 Generalized anxiety disorder: Secondary | ICD-10-CM | POA: Diagnosis not present

## 2018-04-03 ENCOUNTER — Ambulatory Visit: Payer: BLUE CROSS/BLUE SHIELD | Admitting: Obstetrics and Gynecology

## 2018-04-03 ENCOUNTER — Encounter: Payer: Self-pay | Admitting: Obstetrics and Gynecology

## 2018-04-03 VITALS — BP 124/84 | HR 67 | Ht 64.0 in | Wt 191.0 lb

## 2018-04-03 DIAGNOSIS — Z3169 Encounter for other general counseling and advice on procreation: Secondary | ICD-10-CM | POA: Insufficient documentation

## 2018-04-03 DIAGNOSIS — R51 Headache: Secondary | ICD-10-CM | POA: Diagnosis not present

## 2018-04-03 DIAGNOSIS — Z3141 Encounter for fertility testing: Secondary | ICD-10-CM

## 2018-04-03 DIAGNOSIS — M9901 Segmental and somatic dysfunction of cervical region: Secondary | ICD-10-CM | POA: Diagnosis not present

## 2018-04-03 DIAGNOSIS — M62830 Muscle spasm of back: Secondary | ICD-10-CM | POA: Diagnosis not present

## 2018-04-03 DIAGNOSIS — M9902 Segmental and somatic dysfunction of thoracic region: Secondary | ICD-10-CM | POA: Diagnosis not present

## 2018-04-03 DIAGNOSIS — F411 Generalized anxiety disorder: Secondary | ICD-10-CM | POA: Diagnosis not present

## 2018-04-03 NOTE — Progress Notes (Signed)
Wants to discuss fertility issues, has been trying for 6 months

## 2018-04-03 NOTE — Progress Notes (Signed)
Obstetrics and Gynecology Established Patient Evaluation  Appointment Date: 04/03/2018  [facility]  Primary Care Provider: Lorre MunroeBaity, Julie W  Referring Provider: Self  Chief Complaint:  Chief Complaint  Patient presents with  . Infertility    History of Present Illness: [name] is a 43 y.o. African-American G2P1011 (Patient's last menstrual period was 03/13/2018 (exact date).), seen for the above chief complaint. Her past medical history is significant for HTN, BMI 33, AMA  Patient had Mirena taken out about 6 months ago and no success with pregnancy. Her and her partner are using an app for timed intercourse and she's had + results with OTC ovulation predictor kits. Her periods are qmonth, regular, lighter than before the mirena and are about 3-5d. 2019 TSH negative.   Her partner is 149, has no children, no tobacco or alcohol use, not on medications. She states he did have an injury to his testicles when playing basketball and it sounds like he may have a "knot" on them.     No breast s/s, fevers, chills, chest pain, SOB, nausea, vomiting, abdominal pain, dysuria, hematuria, vaginal itching, dyspareunia, SUI or OAB, diarrhea, constipation, blood in BMs  Review of Systems:  as noted in the History of Present Illness.   Past Medical History:  Past Medical History:  Diagnosis Date  . Depression   . Diverticulosis 2018  . Frequent headaches   . Heart murmur   . History of chicken pox   . Hypertension     Past Surgical History:  Past Surgical History:  Procedure Laterality Date  . COLONOSCOPY WITH PROPOFOL N/A 11/28/2016   Procedure: COLONOSCOPY WITH PROPOFOL;  Surgeon: [name], Julie W, MD;  Location: [facility] ENDOSCOPY;  Service: Endoscopy;  Laterality: N/A;  . EXCISIONAL HEMORRHOIDECTOMY  2014    Past Obstetrical History:  OB History  Gravida Para Term Preterm AB Living  2 1 1   1 1   SAB TAB Ectopic Multiple Live  Births  1       1    # Outcome Date GA Lbr Len/2nd Weight Sex Delivery Anes PTL Lv  2 Term 11/30/03 56165w0d  8 lb (3.629 kg) M Vag-Spont   LIV  1 SAB  6965w0d           Obstetric Comments  1st Menstrual Cycle:  13   1st Pregnancy:  27    Past Gynecological History: As per HPI. History of Pap Smear(s): Yes.   Last pap 12/2015, which was negative and HPV negative  Social History:  Social History   Socioeconomic History  . Marital status: Divorced    Spouse name: Not on file  . Number of children: Not on file  . Years of education: Not on file  . Highest education level: Not on file  Occupational History  . Not on file  Social Needs  . Financial resource strain: Not on file  . Food insecurity:    Worry: Not on file    Inability: Not on file  . Transportation needs:    Medical: Not on file    Non-medical: Not on file  Tobacco Use  . Smoking status: Never Smoker  . Smokeless tobacco: Never Used  Substance and Sexual Activity  . Alcohol use: Not Currently  . Drug use: No  . Sexual activity: Yes    Birth control/protection: None  Lifestyle  . Physical activity:    Days per week: Not on file    Minutes per session: Not  on file  . Stress: Not on file  Relationships  . Social connections:    Talks on phone: Not on file    Gets together: Not on file    Attends religious service: Not on file    Active member of club or organization: Not on file    Attends meetings of clubs or organizations: Not on file    Relationship status: Not on file  . Intimate partner violence:    Fear of current or ex partner: Not on file    Emotionally abused: Not on file    Physically abused: Not on file    Forced sexual activity: Not on file  Other Topics Concern  . Not on file  Social History Narrative  . Not on file    Family History:  Family History  Problem Relation Age of Onset  . Heart disease Mother        Unsure    Health Maintenance:  Mammogram(s): Yes.   Date: 10/2017.  Negative. Repeat one year  Medications [name] had no medications administered during this visit. Current Outpatient Medications  Medication Sig Dispense Refill  . Multiple Vitamin (MULTIVITAMIN) tablet Take 1 tablet by mouth daily.     No current facility-administered medications for this visit.     Allergies Patient has no known allergies.   Physical Exam:  BP 124/84   Pulse 67   Ht 5\' 4"  (1.626 m)   Wt 191 lb (86.6 kg)   LMP 03/13/2018 (Exact Date)   BMI 32.79 kg/m  Body mass index is 32.79 kg/m. General appearance: Well nourished, well developed female in no acute distress.  Respiratory:  Normal respiratory effort Neuro/Psych:  Normal mood and affect.   Pelvic exam: negative at IUD removal visit  Laboratory: none  Radiology: none  Assessment: pt stable  Plan:  1. Fertility testing Recommend AMH level and referral to REI and that her partner will need semen analysis. [facility]  - Anti mullerian hormone - Ambulatory referral to Infertility  Orders Placed This Encounter  Procedures  . Anti mullerian hormone  . Ambulatory referral to Infertility    RTC PRN  Julie Copaharlie Sanam Marmo, Jr MD Attending [facility](Faculty Practice)

## 2018-04-08 ENCOUNTER — Encounter: Payer: Self-pay | Admitting: Emergency Medicine

## 2018-04-08 DIAGNOSIS — F329 Major depressive disorder, single episode, unspecified: Secondary | ICD-10-CM | POA: Insufficient documentation

## 2018-04-09 LAB — ANTI MULLERIAN HORMONE: ANTI-MULLERIAN HORMONE (AMH): 0.438 ng/mL

## 2018-04-10 DIAGNOSIS — F411 Generalized anxiety disorder: Secondary | ICD-10-CM | POA: Diagnosis not present

## 2018-04-17 DIAGNOSIS — F411 Generalized anxiety disorder: Secondary | ICD-10-CM | POA: Diagnosis not present

## 2018-05-01 DIAGNOSIS — M9901 Segmental and somatic dysfunction of cervical region: Secondary | ICD-10-CM | POA: Diagnosis not present

## 2018-05-01 DIAGNOSIS — M62830 Muscle spasm of back: Secondary | ICD-10-CM | POA: Diagnosis not present

## 2018-05-01 DIAGNOSIS — F411 Generalized anxiety disorder: Secondary | ICD-10-CM | POA: Diagnosis not present

## 2018-05-01 DIAGNOSIS — R51 Headache: Secondary | ICD-10-CM | POA: Diagnosis not present

## 2018-05-01 DIAGNOSIS — M9902 Segmental and somatic dysfunction of thoracic region: Secondary | ICD-10-CM | POA: Diagnosis not present

## 2018-05-15 DIAGNOSIS — F411 Generalized anxiety disorder: Secondary | ICD-10-CM | POA: Diagnosis not present

## 2018-05-22 DIAGNOSIS — F411 Generalized anxiety disorder: Secondary | ICD-10-CM | POA: Diagnosis not present

## 2018-05-29 DIAGNOSIS — F411 Generalized anxiety disorder: Secondary | ICD-10-CM | POA: Diagnosis not present

## 2018-06-05 DIAGNOSIS — F411 Generalized anxiety disorder: Secondary | ICD-10-CM | POA: Diagnosis not present

## 2018-06-12 DIAGNOSIS — F411 Generalized anxiety disorder: Secondary | ICD-10-CM | POA: Diagnosis not present

## 2018-06-19 DIAGNOSIS — F411 Generalized anxiety disorder: Secondary | ICD-10-CM | POA: Diagnosis not present

## 2018-06-26 DIAGNOSIS — F411 Generalized anxiety disorder: Secondary | ICD-10-CM | POA: Diagnosis not present

## 2018-07-03 DIAGNOSIS — F411 Generalized anxiety disorder: Secondary | ICD-10-CM | POA: Diagnosis not present

## 2018-07-10 DIAGNOSIS — F411 Generalized anxiety disorder: Secondary | ICD-10-CM | POA: Diagnosis not present

## 2018-07-24 ENCOUNTER — Other Ambulatory Visit: Payer: BLUE CROSS/BLUE SHIELD

## 2018-07-24 DIAGNOSIS — F411 Generalized anxiety disorder: Secondary | ICD-10-CM | POA: Diagnosis not present

## 2018-07-25 ENCOUNTER — Inpatient Hospital Stay (HOSPITAL_COMMUNITY)
Admission: AD | Admit: 2018-07-25 | Discharge: 2018-07-25 | Disposition: A | Discharge status: Home / Self Care | Payer: BC Managed Care – PPO | Attending: Obstetrics and Gynecology | Admitting: Obstetrics and Gynecology

## 2018-07-25 ENCOUNTER — Other Ambulatory Visit: Payer: Self-pay

## 2018-07-25 DIAGNOSIS — R109 Unspecified abdominal pain: Secondary | ICD-10-CM | POA: Diagnosis not present

## 2018-07-25 DIAGNOSIS — Z3202 Encounter for pregnancy test, result negative: Secondary | ICD-10-CM | POA: Diagnosis not present

## 2018-07-25 DIAGNOSIS — N939 Abnormal uterine and vaginal bleeding, unspecified: Secondary | ICD-10-CM | POA: Insufficient documentation

## 2018-07-25 LAB — POCT PREGNANCY, URINE: Preg Test, Ur: NEGATIVE

## 2018-07-25 LAB — HCG, QUANTITATIVE, PREGNANCY: hCG, Beta Chain, Quant, S: 6 m[IU]/mL — ABNORMAL HIGH (ref <5)

## 2018-07-25 NOTE — MAU Note (Signed)
Presents with c/o VB and abdominal cramping.  Reports +HPT on Tuesday. LMP 06/25/2018

## 2018-07-25 NOTE — MAU Provider Note (Signed)
First Provider Initiated Contact with Patient 07/25/18 1609     S Ms. [name] is a 43 y.o. G65P1011 female who presents to MAU with chief complaints of abdominal cramping and vaginal bleeding in setting of positive home pregnancy test 07/22/18.  LMP 06/25/18.  Upon learning of negative pregnancy test in MAU patient requests serum quant hCG.  O BP 123/88 (BP Location: Right Arm)   Pulse 71   Temp 98.3 F (36.8 C) (Oral)   Resp 20   Ht 5\' 4"  (1.626 m)   Wt 89.4 kg   LMP 06/25/2018   SpO2 99%   BMI 33.84 kg/m     Physical Exam  Nursing note and vitals reviewed. Constitutional: She is oriented to person, place, and time. She appears well-developed and well-nourished.  Respiratory: Effort normal.  Neurological: She is alert and oriented to person, place, and time.  Skin: Skin is warm and dry.  Psychiatric: She has a normal mood and affect. Her behavior is normal. Judgment and thought content normal.   A Negative pregnancy test in MAU Pt offered serum Quant hCG, reviewed result of hCG of 6  Medical screening exam complete Greater than 30 minutes spent with patient discussing change in urine pregnancy test results and possible plans of care based on patient desires for pregnancy and outpatient testing  P Discharge from MAU in stable condition Message sent to [facility] for follow-up Quant hCG in about one week   Calvert Cantor, [place] 07/25/2018 5:46 PM

## 2018-07-25 NOTE — Discharge Instructions (Signed)
Human Chorionic Gonadotropin Test °Why am I having this test? °A human chorionic gonadotropin (hCG) test is done to determine whether you are pregnant. It can also be used: °· To diagnose an abnormal pregnancy. °· To determine whether you have had a failed pregnancy (miscarriage) or are at risk of one. °What is being tested? °This test checks the level of the human chorionic gonadotropin (hCG) hormone in the blood. This hormone is produced during pregnancy by the cells that form the placenta. The placenta is the organ that grows inside your womb (uterus) to nourish a developing baby. When you are pregnant, hCG can be detected in your blood or urine 7 to 8 days before your missed period. It continues to go up for the first 8-10 weeks of pregnancy. °The presence of hCG in your blood can be measured with several different types of tests. You may have: °· A urine test. °? Because this hormone is eliminated from your body by your kidneys, you may have a urine test to find out whether you are pregnant. A home pregnancy test detects whether there is hCG in your urine. °? A urine test only shows whether there is hCG in your urine. It does not measure how much. °· A qualitative blood test. °? You may have this type of blood test to find out if you are pregnant. °? This blood test only shows whether there is hCG in your blood. It does not measure how much. °· A quantitative blood test. °? This type of blood test measures the amount of hCG in your blood. °? You may have this test to: °§ Diagnose an abnormal pregnancy. °§ Check whether you have had a miscarriage. °§ Determine whether you are at risk of a miscarriage. °What kind of sample is taken? ° °  ° °Two kinds of samples may be collected to test for the hCG hormone. °· Blood. It is usually collected by inserting a needle into a blood vessel. °· Urine. It is usually collected by urinating into a germ-free (sterile) specimen cup. It is best to collect the sample the first  time you urinate in the morning. °How do I prepare for this test? °No preparation is needed for a blood test.  °For the urine test: °· Let your health care provider know about: °? All medicines you are taking, including vitamins, herbs, creams, and over-the-counter medicines. °? Any blood in your urine. This may interfere with the result. °· Do not drink too much fluid. Drink as you normally would, or as directed by your health care provider. °How are the results reported? °Depending on the type of test that you have, your test results may be reported as values. Your health care provider will compare your results to normal ranges that were established after testing a large group of people (reference ranges). Reference ranges may vary among labs and hospitals. For this test, common reference ranges that show absence of pregnancy are: °· Quantitative hCG blood levels: less than 5 IU/L. °Other results will be reported as either positive or negative. For this test, normal results (meaning the absence of pregnancy) are: °· Negative for hCG in the urine test. °· Negative for hCG in the qualitative blood test. °What do the results mean? °Urine and qualitative blood test °· A negative result could mean: °? That you are not pregnant. °? That the test was done too early in your pregnancy to detect hCG in your blood or urine. If you still have other signs   of pregnancy, the test will be repeated. °· A positive result means: °? That you are most likely pregnant. Your health care provider may confirm your pregnancy with an imaging study (ultrasound) of your uterus, if needed. °Quantitative blood test °Results of the quantitative hCG blood test will be interpreted as follows: °· Less than 5 IU/L: You are most likely not pregnant. °· Greater than 25 IU/L: You are most likely pregnant. °· hCG levels that are higher than expected: °? You are pregnant with twins. °? You have abnormal growths in the uterus. °· hCG levels that are  rising more slowly than expected: °? You have an ectopic pregnancy (also called a tubal pregnancy). °· hCG levels that are falling: °? You may be having a miscarriage. °Talk with your health care provider about what your results mean. °Questions to ask your health care provider °Ask your health care provider, or the department that is doing the test: °· When will my results be ready? °· How will I get my results? °· What are my treatment options? °· What other tests do I need? °· What are my next steps? °Summary °· A human chorionic gonadotropin test is done to determine whether you are pregnant. °· When you are pregnant, hCG can be detected in your blood or urine 7 to 8 days before your missed period. It continues to go up for the first 8-10 weeks of pregnancy. °· Your hCG level can be measured with different types of tests. You may have a urine test, a qualitative blood test, or a quantitative blood test. °· Talk with your health care provider about what your results mean. °This information is not intended to replace advice given to you by your health care provider. Make sure you discuss any questions you have with your health care provider. °Document Released: 03/09/2004 Document Revised: 01/07/2017 Document Reviewed: 01/07/2017 °Elsevier Interactive Patient Education © 2019 [employer] ° °

## 2018-07-28 ENCOUNTER — Telehealth: Payer: Self-pay | Admitting: Radiology

## 2018-07-28 NOTE — Telephone Encounter (Signed)
Left message for patient to call [facility] to schedule a HCG lab on Thursday 07/31/18 or 08/01/18, also to schedule appointment with Dr Ilda Basset to f/u for infertility.

## 2018-07-31 ENCOUNTER — Other Ambulatory Visit: Payer: Self-pay

## 2018-07-31 ENCOUNTER — Other Ambulatory Visit: Payer: BC Managed Care – PPO

## 2018-07-31 ENCOUNTER — Encounter: Payer: Self-pay | Admitting: Obstetrics and Gynecology

## 2018-07-31 ENCOUNTER — Ambulatory Visit (INDEPENDENT_AMBULATORY_CARE_PROVIDER_SITE_OTHER): Payer: BC Managed Care – PPO | Admitting: Obstetrics and Gynecology

## 2018-07-31 VITALS — BP 138/82 | HR 68 | Wt 195.0 lb

## 2018-07-31 DIAGNOSIS — O3680X0 Pregnancy with inconclusive fetal viability, not applicable or unspecified: Secondary | ICD-10-CM | POA: Diagnosis not present

## 2018-07-31 DIAGNOSIS — F411 Generalized anxiety disorder: Secondary | ICD-10-CM | POA: Diagnosis not present

## 2018-07-31 NOTE — Progress Notes (Signed)
Obstetrics and Gynecology Visit Return Patient Evaluation  Appointment Date: 07/31/2018  Primary Care Provider: Baity, [place] for [facility]  Chief Complaint: f/u MAU visit  History of Present Illness:  [name] went to MAU on 6/5 for VB after multiple +UPTs at home. Beta hcg in MAU was 6. Pt told to follow up. Had VB that stopped 2 days ago. Currently asymptomatic. Pt did not go to REI due to costs  Review of Systems: as noted in the History of Present Illness.  Medications:  Su [name] had no medications administered during this visit. Current Outpatient Medications  Medication Sig Dispense Refill  . Multiple Vitamin (MULTIVITAMIN) tablet Take 1 tablet by mouth daily.    Marland Kitchen buPROPion (WELLBUTRIN XL) 150 MG 24 hr tablet Take 150 mg by mouth every morning.    . traZODone (DESYREL) 50 MG tablet Take 50 mg by mouth at bedtime.    . zaleplon (SONATA) 10 MG capsule Take 10 mg by mouth at bedtime as needed for sleep.     No current facility-administered medications for this visit.     Allergies: has No Known Allergies.  Physical Exam:  BP 138/82   Pulse 68   Wt 195 lb (88.5 kg)   BMI 33.47 kg/m  Body mass index is 33.47 kg/m. General appearance: Well nourished, well developed female in no acute distress.    Assessment: pt doing well  Plan:  1. Pregnancy of unknown anatomic location Rpt lab today. D/w her that likely had her period after SAB and to look out for this time next month for repeat s/s. D/w her re: likely will recommend egg donation if goes to REI. Pt to see what happens spontaneously.   RTC: PRN  Durene Romans MD Attending Center for [facility])

## 2018-08-01 LAB — ABO AND RH: Rh Factor: POSITIVE

## 2018-08-01 LAB — BETA HCG QUANT (REF LAB): hCG Quant: <1 m[IU]/mL

## 2018-08-07 DIAGNOSIS — F411 Generalized anxiety disorder: Secondary | ICD-10-CM | POA: Diagnosis not present

## 2018-08-14 ENCOUNTER — Ambulatory Visit: Payer: BLUE CROSS/BLUE SHIELD | Admitting: Physician Assistant

## 2018-08-14 DIAGNOSIS — F411 Generalized anxiety disorder: Secondary | ICD-10-CM | POA: Diagnosis not present

## 2018-08-21 DIAGNOSIS — F411 Generalized anxiety disorder: Secondary | ICD-10-CM | POA: Diagnosis not present

## 2018-08-25 ENCOUNTER — Encounter: Payer: BLUE CROSS/BLUE SHIELD | Admitting: Obstetrics and Gynecology

## 2018-08-28 DIAGNOSIS — F411 Generalized anxiety disorder: Secondary | ICD-10-CM | POA: Diagnosis not present

## 2018-09-11 DIAGNOSIS — F411 Generalized anxiety disorder: Secondary | ICD-10-CM | POA: Diagnosis not present

## 2018-09-12 ENCOUNTER — Telehealth: Payer: Self-pay | Admitting: *Deleted

## 2018-09-12 NOTE — Telephone Encounter (Signed)
Called pt back in regards to pts message, informed pt that we would check a UPT and depending on what that shows we would attempt an ultrasound to determine pregnancy.Pt states LMP was 6/5.  Pt verbalizes and understands.

## 2018-09-18 ENCOUNTER — Ambulatory Visit (INDEPENDENT_AMBULATORY_CARE_PROVIDER_SITE_OTHER): Payer: BC Managed Care – PPO | Admitting: Obstetrics and Gynecology

## 2018-09-18 ENCOUNTER — Encounter: Payer: Self-pay | Admitting: Obstetrics and Gynecology

## 2018-09-18 ENCOUNTER — Other Ambulatory Visit: Payer: Self-pay

## 2018-09-18 VITALS — BP 128/82 | HR 72 | Wt 197.2 lb

## 2018-09-18 DIAGNOSIS — Z3A01 Less than 8 weeks gestation of pregnancy: Secondary | ICD-10-CM | POA: Diagnosis not present

## 2018-09-18 DIAGNOSIS — O3680X0 Pregnancy with inconclusive fetal viability, not applicable or unspecified: Secondary | ICD-10-CM

## 2018-09-18 DIAGNOSIS — Z3201 Encounter for pregnancy test, result positive: Secondary | ICD-10-CM

## 2018-09-18 DIAGNOSIS — O0990 Supervision of high risk pregnancy, unspecified, unspecified trimester: Secondary | ICD-10-CM | POA: Insufficient documentation

## 2018-09-18 DIAGNOSIS — O0991 Supervision of high risk pregnancy, unspecified, first trimester: Secondary | ICD-10-CM

## 2018-09-18 DIAGNOSIS — F411 Generalized anxiety disorder: Secondary | ICD-10-CM | POA: Diagnosis not present

## 2018-09-18 LAB — POCT URINE PREGNANCY: Preg Test, Ur: POSITIVE — AB

## 2018-09-18 NOTE — Patient Instructions (Addendum)
You are 7 weeks and 5 days today Your due date is March 13

## 2018-09-18 NOTE — Progress Notes (Signed)
Last period 07/25/2018

## 2018-09-18 NOTE — Progress Notes (Signed)
Obstetrics and Gynecology Visit Return Patient Evaluation  Appointment Date: 09/18/2018  Primary Care Provider: Baity, [place] for [facility]  Chief Complaint: amenorrhea  History of Present Illness:  [name] is a 43 y.o. [phone] with the above CC. Patient had an early SAB in June of this year. She had a + home UPT on 6/2 (pt trying to conceive) but then came into the MAU on 6/5 for bleeding and had a quant of 6. She saw me for f/u on 6/11 and had a rpt quant that was negative. Since her MAU visit, no bleeding. Has occasional low belly cramps and she has had sex in the interim.  +breast tenderness and slight nausea.   Review of Systems: as noted in the History of Present Illness.  Medications:  PNV  Allergies: has No Known Allergies.  Physical Exam:  BP 128/82   Pulse 72   Wt 197 lb 3.2 oz (89.4 kg)   LMP 07/25/2018   BMI 33.85 kg/m  Body mass index is 33.85 kg/m. General appearance: Well nourished, well developed female in no acute distress.  Neuro/Psych:  Normal mood and affect.      Lab:  UPT +  Radiology: Transvaginal u/s: SLIUP at 7wks 5 days, FHR 160s-170s. Unable to see b/l adnexa  Assessment: normal high risk pregnancy  Plan:  1. Positive pregnancy test New OB visit and genetics nv. Missed AB precautions given to patient  - POCT urine pregnancy   RTC: 2-3wks  Durene Romans MD Attending Center for [facility])

## 2018-09-25 DIAGNOSIS — F411 Generalized anxiety disorder: Secondary | ICD-10-CM | POA: Diagnosis not present

## 2018-10-09 ENCOUNTER — Other Ambulatory Visit: Payer: Self-pay

## 2018-10-09 ENCOUNTER — Other Ambulatory Visit (HOSPITAL_COMMUNITY)
Admission: RE | Admit: 2018-10-09 | Discharge: 2018-10-09 | Disposition: A | Discharge status: Home / Self Care | Payer: BC Managed Care – PPO | Source: Ambulatory Visit | Attending: Family Medicine | Admitting: Family Medicine

## 2018-10-09 ENCOUNTER — Encounter: Payer: Self-pay | Admitting: Family Medicine

## 2018-10-09 ENCOUNTER — Ambulatory Visit (INDEPENDENT_AMBULATORY_CARE_PROVIDER_SITE_OTHER): Payer: BC Managed Care – PPO | Admitting: Family Medicine

## 2018-10-09 VITALS — BP 126/88 | HR 70 | Wt 197.0 lb

## 2018-10-09 DIAGNOSIS — O99211 Obesity complicating pregnancy, first trimester: Secondary | ICD-10-CM

## 2018-10-09 DIAGNOSIS — O09521 Supervision of elderly multigravida, first trimester: Secondary | ICD-10-CM

## 2018-10-09 DIAGNOSIS — O09511 Supervision of elderly primigravida, first trimester: Secondary | ICD-10-CM | POA: Diagnosis not present

## 2018-10-09 DIAGNOSIS — R51 Headache: Secondary | ICD-10-CM | POA: Diagnosis not present

## 2018-10-09 DIAGNOSIS — Z3A10 10 weeks gestation of pregnancy: Secondary | ICD-10-CM

## 2018-10-09 DIAGNOSIS — M9901 Segmental and somatic dysfunction of cervical region: Secondary | ICD-10-CM | POA: Diagnosis not present

## 2018-10-09 DIAGNOSIS — O0991 Supervision of high risk pregnancy, unspecified, first trimester: Secondary | ICD-10-CM

## 2018-10-09 DIAGNOSIS — Z31430 Encounter of female for testing for genetic disease carrier status for procreative management: Secondary | ICD-10-CM | POA: Diagnosis not present

## 2018-10-09 DIAGNOSIS — F411 Generalized anxiety disorder: Secondary | ICD-10-CM | POA: Diagnosis not present

## 2018-10-09 DIAGNOSIS — O99210 Obesity complicating pregnancy, unspecified trimester: Secondary | ICD-10-CM | POA: Insufficient documentation

## 2018-10-09 DIAGNOSIS — M62830 Muscle spasm of back: Secondary | ICD-10-CM | POA: Diagnosis not present

## 2018-10-09 DIAGNOSIS — M9902 Segmental and somatic dysfunction of thoracic region: Secondary | ICD-10-CM | POA: Diagnosis not present

## 2018-10-09 DIAGNOSIS — O09529 Supervision of elderly multigravida, unspecified trimester: Secondary | ICD-10-CM | POA: Insufficient documentation

## 2018-10-09 MED ORDER — ASPIRIN EC 81 MG PO TBEC: 162.0000 mg | DELAYED_RELEASE_TABLET | Freq: Every day | ORAL | 2 refills | Status: DC

## 2018-10-09 MED ORDER — ASPIRIN EC 81 MG PO TBEC: 81.0000 mg | DELAYED_RELEASE_TABLET | Freq: Every day | ORAL | 2 refills | Status: DC

## 2018-10-09 NOTE — Progress Notes (Signed)
   INITIAL PRENATAL VISIT NOTE  Subjective:  [name] is a 43 y.o. G3P1011 at [redacted]w[redacted]d being seen today for ongoing prenatal care.  She is currently monitored for the following issues for this high-risk pregnancy and has Depression, recurrent ([place]); Insomnia; Frequent headaches; Cough, persistent; MDD (major depressive disorder); Supervision of high risk pregnancy in first trimester; Multigravida of advanced maternal age in first trimester; and Obesity affecting pregnancy in first trimester on their problem list.  Patient reports no complaints.  Contractions: Not present. Vag. Bleeding: None.   . Denies leaking of fluid.   The following portions of the patient's history were reviewed and updated as appropriate: allergies, current medications, past family history, past medical history, past social history, past surgical history and problem list.   Objective:   Vitals:   10/09/18 1023  BP: 126/88  Pulse: 70  Weight: 197 lb (89.4 kg)    Fetal Status: Fetal Heart Rate (bpm): 158 Fundal Height: 10 cm       General:  Alert, oriented and cooperative. Patient is in no acute distress.  Skin: Skin is warm and dry. No rash noted.   Cardiovascular: Normal heart rate noted  Respiratory: Normal respiratory effort, no problems with respiration noted  Abdomen: Soft, gravid, appropriate for gestational age.  Pain/Pressure: Absent     Pelvic: Cervical exam deferred        Extremities: Normal range of motion.     Mental Status: Normal mood and affect. Normal behavior. Normal judgment and thought content.   Assessment and Plan:  Pregnancy: G3P1011 at [redacted]w[redacted]d 1. Supervision of high risk pregnancy in first trimester Patient was seen for IP visit today  Counseled on model of care  CNM, FNP and MD team Reviewed safety of exercise and sex in pregnancy - Cytology - PAP - Obstetric Panel, Including HIV - Culture, OB Urine - Genetic Screening - Enroll Patient in Babyscripts - Babyscripts Schedule  Optimization - [place] MFM OB DETAIL +14 WK; Future - aspirin EC 81 MG tablet; Take 1 tablet (81 mg total) by mouth daily. Take after 12 weeks for prevention of preeclampsia later in pregnancy  Dispense: 300 tablet; Refill: 2  2. Multigravida of advanced maternal age in first trimester - Reviewed desire for NIP - Genetic Screening - aspirin EC 162 MG tablet; Take 1 tablet (81 mg total) by mouth daily. Take after 12 weeks for prevention of preeclampsia later in pregnancy  Dispense: 300 tablet; Refill: 2  3. Obesity affecting pregnancy in first trimester Counseled on 11-15 lb weight gain - Obstetric Panel, Including HIV - aspirin EC 162 MG tablet; Take 1 tablet (81 mg total) by mouth daily. Take after 12 weeks for prevention of preeclampsia later in pregnancy  Dispense: 300 tablet; Refill: 2  Preterm labor symptoms and general obstetric precautions including but not limited to vaginal bleeding, contractions, leaking of fluid and fetal movement were reviewed in detail with the patient. Please refer to After Visit Summary for other counseling recommendations.   Return in about 4 weeks (around 11/06/2018) for Routine prenatal care, Telehealth/Virtual health OB Visit.  Future Appointments  Date Time Provider [place]  11/06/2018 10:00 AM Aletha Halim, MD [facility]  11/27/2018 10:00 AM [facility] NURSE [facility]  11/27/2018 10:00 AM [facility] [place] 3 [facility]    Caren Macadam, MD

## 2018-10-10 LAB — OBSTETRIC PANEL, INCLUDING HIV
Antibody Screen: NEGATIVE
Basophils Absolute: 0 10*3/uL (ref 0.0–0.2)
Basos: 0 %
EOS (ABSOLUTE): 0 10*3/uL (ref 0.0–0.4)
Eos: 0 %
HIV Screen 4th Generation wRfx: NONREACTIVE
Hematocrit: 41.3 % (ref 34.0–46.6)
Hemoglobin: 14.4 g/dL (ref 11.1–15.9)
Hepatitis B Surface Ag: NEGATIVE
Immature Grans (Abs): 0.1 10*3/uL (ref 0.0–0.1)
Immature Granulocytes: 1 %
Lymphocytes Absolute: 1.3 10*3/uL (ref 0.7–3.1)
Lymphs: 12 %
MCH: 32 pg (ref 26.6–33.0)
MCHC: 34.9 g/dL (ref 31.5–35.7)
MCV: 92 fL (ref 79–97)
Monocytes Absolute: 0.5 10*3/uL (ref 0.1–0.9)
Monocytes: 4 %
Neutrophils Absolute: 8.5 10*3/uL — ABNORMAL HIGH (ref 1.4–7.0)
Neutrophils: 83 %
Platelets: 220 10*3/uL (ref 150–450)
RBC: 4.5 x10E6/uL (ref 3.77–5.28)
RDW: 12.1 % (ref 11.7–15.4)
RPR Ser Ql: NONREACTIVE
Rh Factor: POSITIVE
Rubella Antibodies, IGG: 2.21 index (ref >0.99)
WBC: 10.3 10*3/uL (ref 3.4–10.8)

## 2018-10-11 LAB — CULTURE, OB URINE

## 2018-10-11 LAB — URINE CULTURE, OB REFLEX: Organism ID, Bacteria: NO GROWTH

## 2018-10-16 DIAGNOSIS — F411 Generalized anxiety disorder: Secondary | ICD-10-CM | POA: Diagnosis not present

## 2018-10-16 LAB — CYTOLOGY - PAP
Chlamydia: NEGATIVE
Diagnosis: NEGATIVE
HPV: NOT DETECTED
Neisseria Gonorrhea: NEGATIVE

## 2018-10-20 ENCOUNTER — Telehealth: Payer: Self-pay | Admitting: Radiology

## 2018-10-20 NOTE — Telephone Encounter (Signed)
Left message to call [facility] for Panor[facility] results  

## 2018-10-23 DIAGNOSIS — F411 Generalized anxiety disorder: Secondary | ICD-10-CM | POA: Diagnosis not present

## 2018-10-28 ENCOUNTER — Encounter: Payer: Self-pay | Admitting: Radiology

## 2018-10-28 ENCOUNTER — Telehealth: Payer: Self-pay | Admitting: Radiology

## 2018-10-28 NOTE — Telephone Encounter (Signed)
Left message with Horizon results

## 2018-10-30 DIAGNOSIS — F411 Generalized anxiety disorder: Secondary | ICD-10-CM | POA: Diagnosis not present

## 2018-10-30 DIAGNOSIS — M9902 Segmental and somatic dysfunction of thoracic region: Secondary | ICD-10-CM | POA: Diagnosis not present

## 2018-10-30 DIAGNOSIS — R51 Headache: Secondary | ICD-10-CM | POA: Diagnosis not present

## 2018-10-30 DIAGNOSIS — M62830 Muscle spasm of back: Secondary | ICD-10-CM | POA: Diagnosis not present

## 2018-10-30 DIAGNOSIS — M9901 Segmental and somatic dysfunction of cervical region: Secondary | ICD-10-CM | POA: Diagnosis not present

## 2018-11-06 ENCOUNTER — Other Ambulatory Visit: Payer: Self-pay

## 2018-11-06 ENCOUNTER — Telehealth (INDEPENDENT_AMBULATORY_CARE_PROVIDER_SITE_OTHER): Payer: BC Managed Care – PPO | Admitting: Obstetrics and Gynecology

## 2018-11-06 VITALS — BP 114/87 | HR 82

## 2018-11-06 DIAGNOSIS — Z3A14 14 weeks gestation of pregnancy: Secondary | ICD-10-CM

## 2018-11-06 DIAGNOSIS — O0992 Supervision of high risk pregnancy, unspecified, second trimester: Secondary | ICD-10-CM

## 2018-11-06 DIAGNOSIS — O0990 Supervision of high risk pregnancy, unspecified, unspecified trimester: Secondary | ICD-10-CM

## 2018-11-06 DIAGNOSIS — O99210 Obesity complicating pregnancy, unspecified trimester: Secondary | ICD-10-CM

## 2018-11-06 DIAGNOSIS — O09522 Supervision of elderly multigravida, second trimester: Secondary | ICD-10-CM

## 2018-11-06 DIAGNOSIS — G4700 Insomnia, unspecified: Secondary | ICD-10-CM

## 2018-11-06 DIAGNOSIS — O99212 Obesity complicating pregnancy, second trimester: Secondary | ICD-10-CM

## 2018-11-06 NOTE — Progress Notes (Signed)
   TELEHEALTH VIRTUAL OBSTETRICS VISIT ENCOUNTER NOTE  [facility]  I connected with [name] on 11/06/18 at 10:00 AM EDT by telephone at home and verified that I am speaking with the correct person using two identifiers.   I discussed the limitations, risks, security and privacy concerns of performing an evaluation and management service by telephone and the availability of in person appointments. I also discussed with the patient that there may be a patient responsible charge related to this service. The patient expressed understanding and agreed to proceed.  Subjective:  [name] is a 43 y.o. G3P1011 at [redacted]w[redacted]d being followed for ongoing prenatal care.  She is currently monitored for the following issues for this high-risk pregnancy and has Depression, recurrent ([place]); Insomnia; Frequent headaches; Cough, persistent; MDD (major depressive disorder); Supervision of high risk pregnancy, antepartum; AMA (advanced maternal age) multigravida 35+; and Obesity in pregnancy on their problem list.  Patient reports difficulty sleeping with some leg numbness. Reports fetal movement. Denies any contractions, bleeding or leaking of fluid.   The following portions of the patient's history were reviewed and updated as appropriate: allergies, current medications, past family history, past medical history, past social history, past surgical history and problem list.   Objective:   Vitals:   11/06/18 0955  BP: 114/87  Pulse: 82    Babyscripts Data Reviewed: not applicable  General:  Alert, oriented and cooperative.   Mental Status: Normal mood and affect perceived. Normal judgment and thought content.  Rest of physical exam deferred due to type of encounter  Assessment and Plan:  Pregnancy: G3P1011 at [redacted]w[redacted]d 1. Supervision of high risk pregnancy, antepartum Routine care.  Has anatomy u/s ordered Has link but just needs to sign up for babyscripts  2. Multigravida of advanced maternal age in second trimester Declined formal genetic counseling visit cffdna and carrier screen negative Follow up anatomy u/s Confirms on low dose asa  3. Obesity in pregnancy Goals d/w her  4. Insomnia, unspecified type D/w her not pregnancy related and if gets worse can have PT eval  Preterm labor symptoms and general obstetric precautions including but not limited to vaginal bleeding, contractions, leaking of fluid and fetal movement were reviewed in detail with the patient.  I discussed the assessment and treatment plan with the patient. The patient was provided an opportunity to ask questions and all were answered. The patient agreed with the plan and demonstrated an understanding of the instructions. The patient was advised to call back or seek an in-person office evaluation/go to MAU at [facility] for any urgent or concerning symptoms. Please refer to After Visit Summary for other counseling recommendations.   I provided 10 minutes of non-face-to-face time during this encounter. The visit was conducted via MyChart-medicine  Return in about 4 weeks (around 12/04/2018) for 3-4wks, high risk, virtual visit.  Future Appointments  Date Time Provider [place]  11/27/2018 10:00 AM [place] [facility]  11/27/2018 10:00 AM [facility] [place] 3 [facility]    Toshiye Kever, MD Center for [employer], [place]

## 2018-11-06 NOTE — Progress Notes (Signed)
114/87 P-82

## 2018-11-13 DIAGNOSIS — F411 Generalized anxiety disorder: Secondary | ICD-10-CM | POA: Diagnosis not present

## 2018-11-19 ENCOUNTER — Other Ambulatory Visit (HOSPITAL_COMMUNITY)
Admission: RE | Admit: 2018-11-19 | Discharge: 2018-11-19 | Disposition: A | Discharge status: Home / Self Care | Payer: BC Managed Care – PPO | Source: Ambulatory Visit | Attending: Advanced Practice Midwife | Admitting: Advanced Practice Midwife

## 2018-11-19 ENCOUNTER — Other Ambulatory Visit: Payer: Self-pay

## 2018-11-19 ENCOUNTER — Ambulatory Visit (INDEPENDENT_AMBULATORY_CARE_PROVIDER_SITE_OTHER): Payer: BC Managed Care – PPO | Admitting: *Deleted

## 2018-11-19 DIAGNOSIS — R102 Pelvic and perineal pain: Secondary | ICD-10-CM

## 2018-11-19 NOTE — Progress Notes (Signed)
Pt here with c/o's not really feeling fetal mvts.  I explained that it is rare to feel very much fetal mvt @ 16 weeks is any.   She is also having some vaginal cramping and discharge.  FHT with doppler are 138 BPM and pt is going to do a self swab to check for BV or yeast.  Pt states that her cramping is a 2 on pain scale and not consistent.  Discussed with patient about round ligament pain and need to a pregnancy belt in future.  She appears to be content with our talk.  Will notify with Aptima swab results.

## 2018-11-21 LAB — CERVICOVAGINAL ANCILLARY ONLY
Bacterial Vaginitis (gardnerella): NEGATIVE
Candida Glabrata: NEGATIVE
Candida Vaginitis: NEGATIVE
Molecular Disclaimer: NEGATIVE
Molecular Disclaimer: NEGATIVE
Molecular Disclaimer: NORMAL

## 2018-11-23 DIAGNOSIS — Z20828 Contact with and (suspected) exposure to other viral communicable diseases: Secondary | ICD-10-CM | POA: Diagnosis not present

## 2018-11-27 ENCOUNTER — Encounter (HOSPITAL_COMMUNITY): Payer: Self-pay

## 2018-11-27 ENCOUNTER — Other Ambulatory Visit (HOSPITAL_COMMUNITY): Payer: Self-pay | Admitting: *Deleted

## 2018-11-27 ENCOUNTER — Ambulatory Visit (HOSPITAL_COMMUNITY)
Admission: RE | Admit: 2018-11-27 | Discharge: 2018-11-27 | Disposition: A | Discharge status: Home / Self Care | Payer: BC Managed Care – PPO | Source: Ambulatory Visit | Attending: Family Medicine | Admitting: Family Medicine

## 2018-11-27 ENCOUNTER — Ambulatory Visit (HOSPITAL_COMMUNITY): Payer: BC Managed Care – PPO | Admitting: *Deleted

## 2018-11-27 ENCOUNTER — Other Ambulatory Visit: Payer: Self-pay

## 2018-11-27 VITALS — BP 119/89 | HR 79 | Temp 98.6°F

## 2018-11-27 DIAGNOSIS — O0991 Supervision of high risk pregnancy, unspecified, first trimester: Secondary | ICD-10-CM

## 2018-11-27 DIAGNOSIS — Z3A17 17 weeks gestation of pregnancy: Secondary | ICD-10-CM | POA: Diagnosis not present

## 2018-11-27 DIAGNOSIS — Z362 Encounter for other antenatal screening follow-up: Secondary | ICD-10-CM

## 2018-11-27 DIAGNOSIS — Z363 Encounter for antenatal screening for malformations: Secondary | ICD-10-CM

## 2018-11-27 DIAGNOSIS — O09522 Supervision of elderly multigravida, second trimester: Secondary | ICD-10-CM | POA: Diagnosis not present

## 2018-11-27 DIAGNOSIS — O99212 Obesity complicating pregnancy, second trimester: Secondary | ICD-10-CM | POA: Diagnosis not present

## 2018-11-27 DIAGNOSIS — F411 Generalized anxiety disorder: Secondary | ICD-10-CM | POA: Diagnosis not present

## 2018-11-27 IMAGING — US US MFM OB DETAIL+14 WK
2 series · 13 of 28 positions shown · non-contrast
Comparison: none

[Series 1: us mfm ob detail+14 wk · 10 of 67 slices shown (1 of 2)]
[im 4/67]
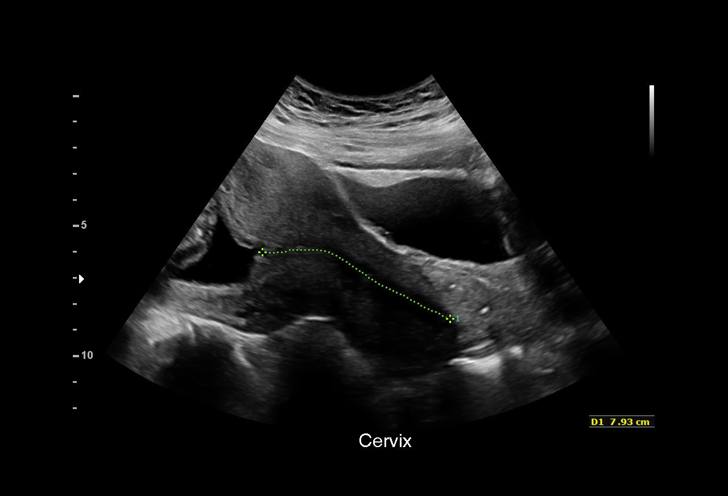
[im 10/67]
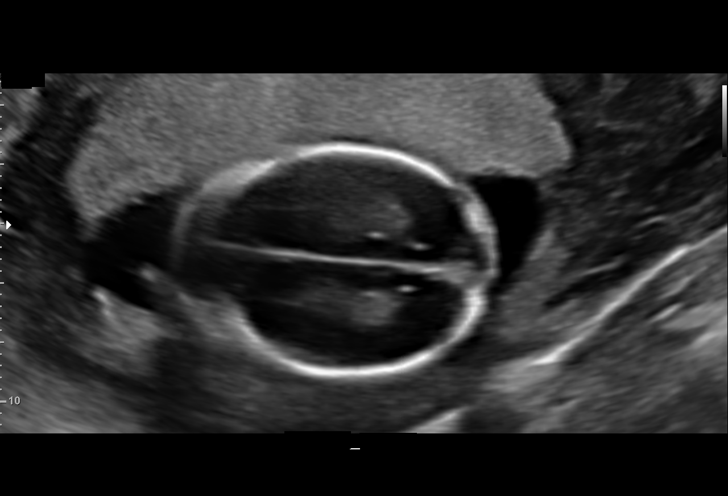
[im 16/67]
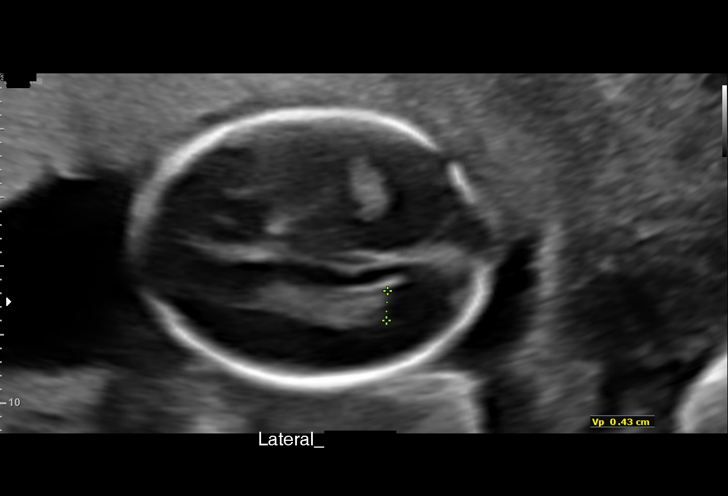
[im 23/67]
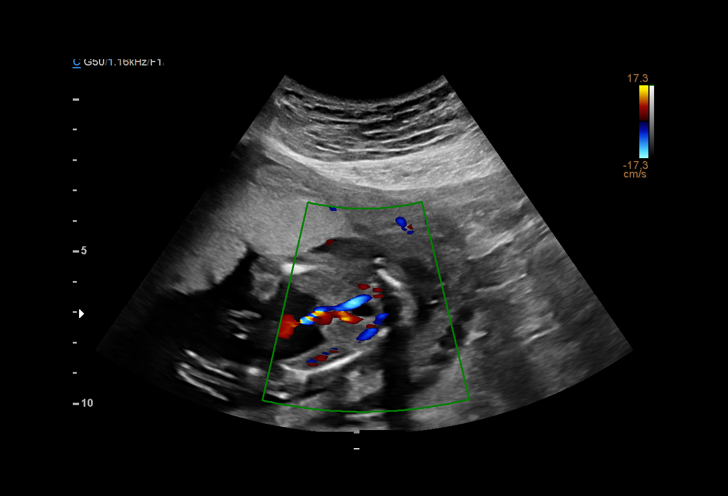
[im 29/67]
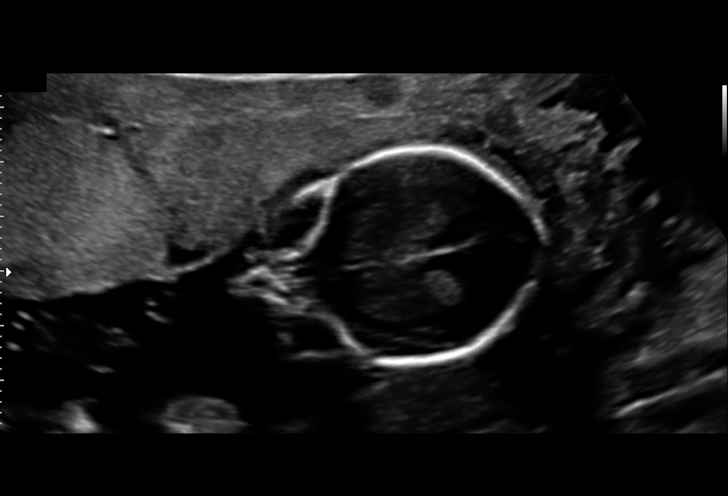
[im 35/67]
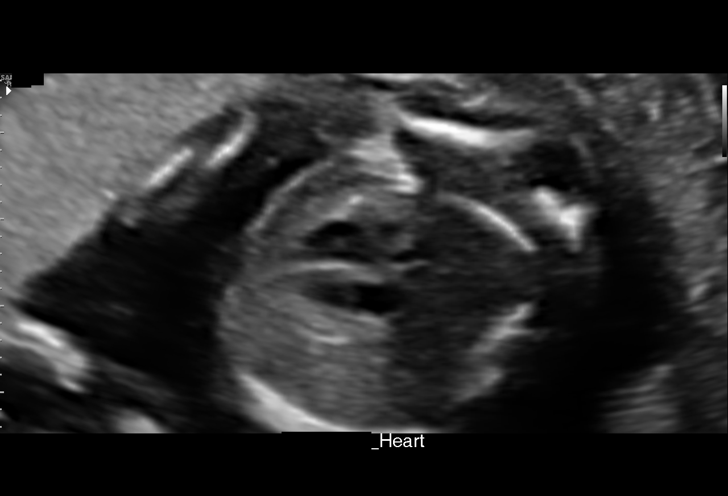
[im 45/67]
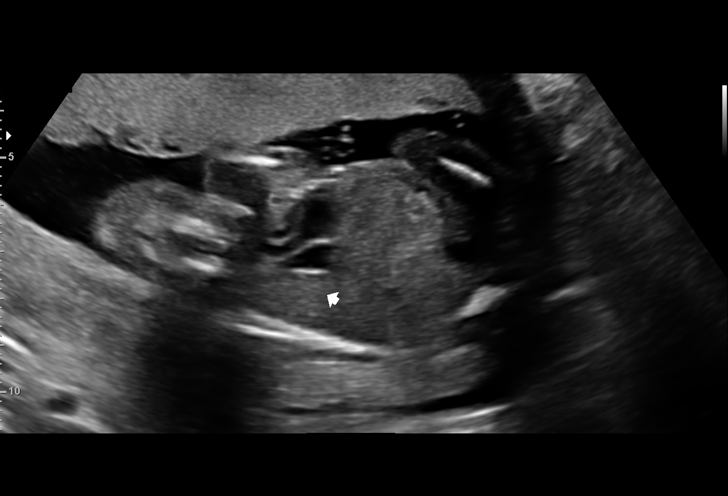
[im 51/67]
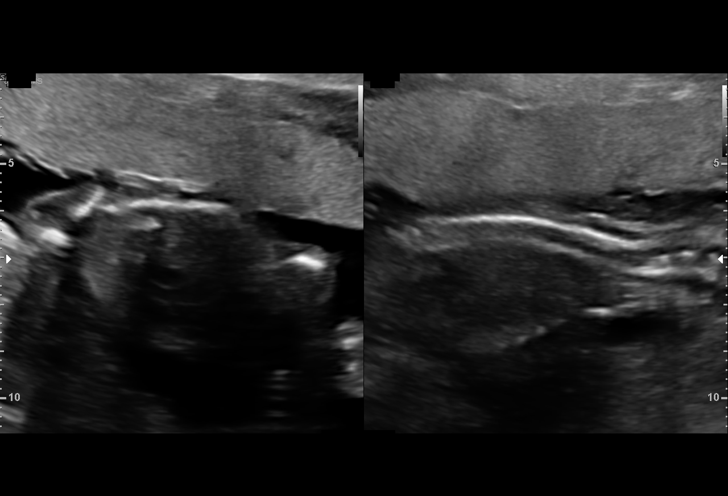
[im 57/67]
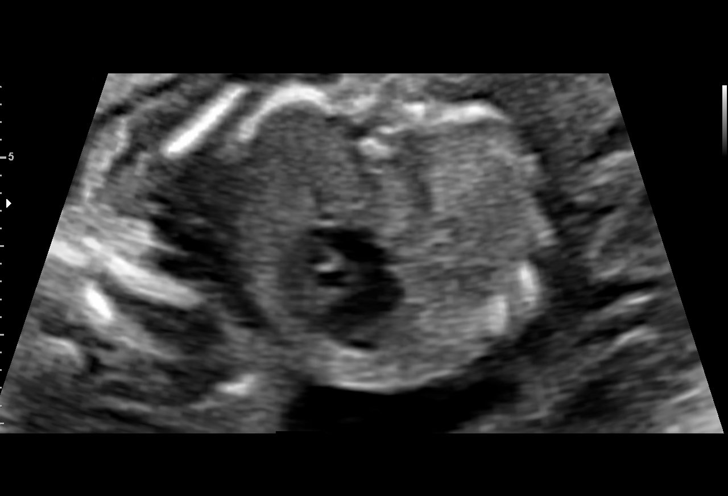
[im 63/67]
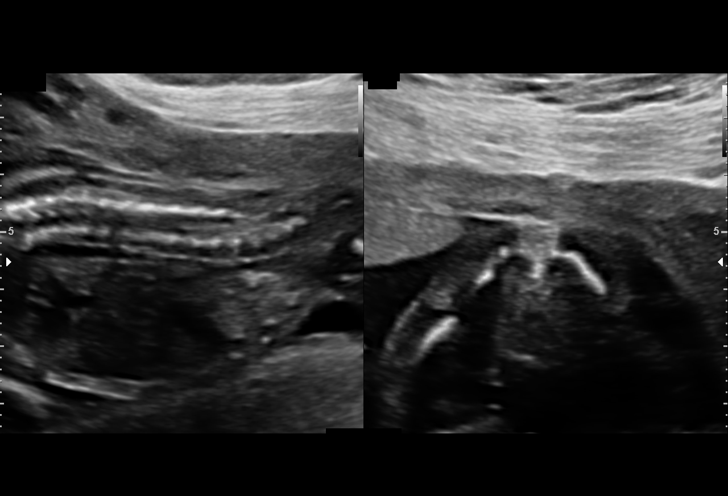

[Series 3: us mfm ob detail+14 wk · 3 of 17 slices shown (2 of 2)]
[im 1/17]
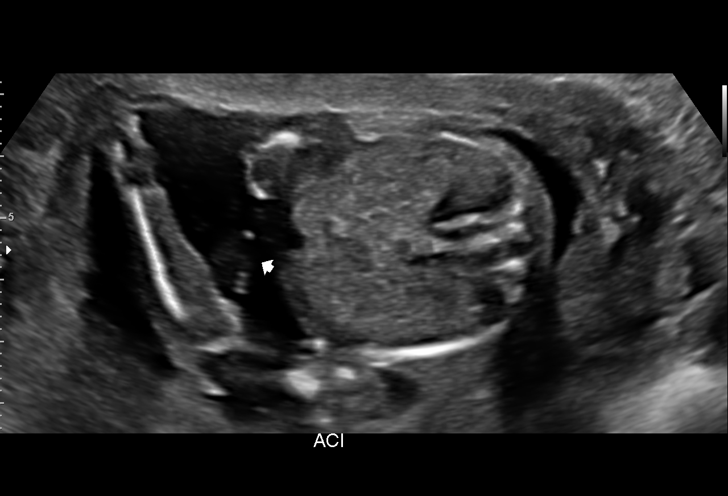
[im 7/17]
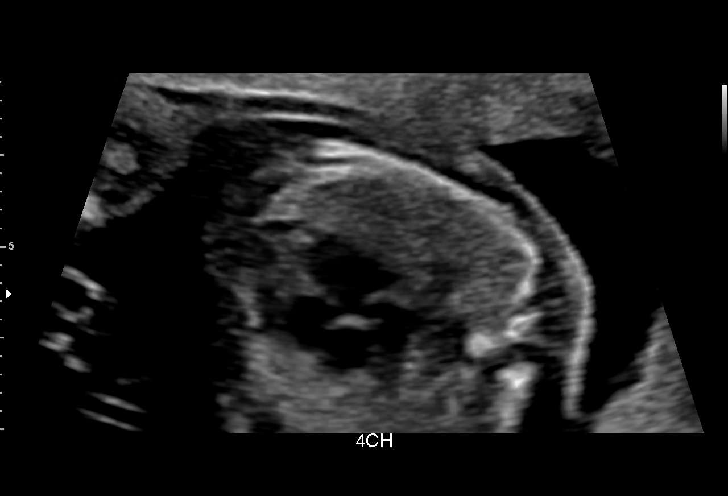
[im 13/17]
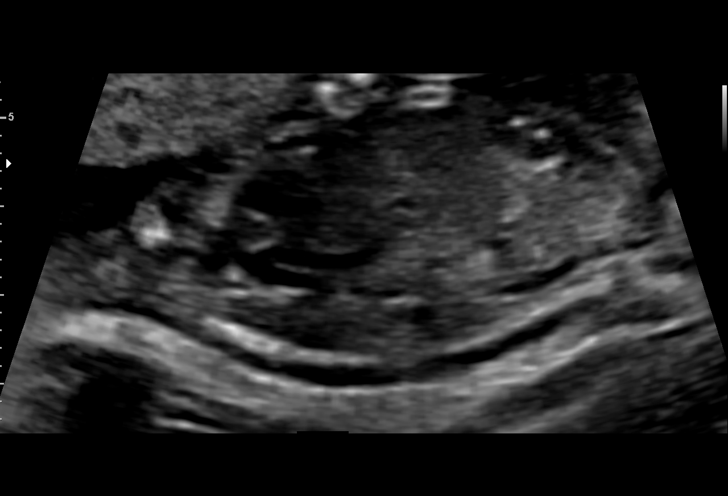

[13 of 28 positions shown; findings below may reference images not displayed]

----------------------------------------------------------------------

 ----------------------------------------------------------------------
Indications

  Encounter for antenatal screening for          [3Z]
  malformations (LOW RISK NIPS)
  Obesity complicating pregnancy                 [3Z] [3Z]
  (PREGRAVIDA BMI 33)
  Advanced maternal age multigravida 35+,        [3Z]
  second trimester
  17 weeks gestation of pregnancy
 ----------------------------------------------------------------------
Fetal Evaluation

 Num Of Fetuses:         1
 Fetal Heart Rate(bpm):  138
 Cardiac Activity:       Observed
 Presentation:           Variable
 Placenta:               Anterior
 P. Cord Insertion:      Visualized

 Amniotic Fluid
 AFI FV:      Within normal limits

                             Largest Pocket(cm)

Biometry

 BPD:      38.5  mm     G. Age:  17w 5d         51  %    CI:        67.12   %    70 - 86
                                                         FL/HC:      18.9   %    15.8 - 18
 HC:      150.5  mm     G. Age:  18w 1d         62  %    HC/AC:      1.15        1.07 -
 AC:      130.6  mm     G. Age:  18w 4d         77  %    FL/BPD:     73.8   %
 FL:       28.4  mm     G. Age:  18w 5d         80  %    FL/AC:      21.7   %    20 - 24
 HUM:      25.2  mm     G. Age:  18w 0d         61  %
 CER:      18.7  mm     G. Age:  18w 2d         66  %
 NFT:       4.1  mm
 LV:        4.3  mm
 CM:        3.1  mm

 Est. FW:     246  gm      0 lb 9 oz     91  %
OB History

 Gravidity:    4         Term:   1         SAB:   2
 Living:       1
Gestational Age

 LMP:           17w 6d        Date:  [DATE]                 EDD:   [DATE]
 U/S Today:     18w 2d                                        EDD:   [DATE]
 Best:          17w 5d     Det. By:  Previous Ultrasound      EDD:   [DATE]
                                     ([DATE])
Anatomy

 Cranium:               Appears normal         Aortic Arch:            Appears normal
 Cavum:                 Appears normal         Ductal Arch:            Not well visualized
 Ventricles:            Appears normal         Diaphragm:              Appears normal
 Choroid Plexus:        Appears normal         Stomach:                Appears normal, left
                                                                       sided
 Cerebellum:            Appears normal         Abdomen:                Appears normal
 Posterior Fossa:       Appears normal         Abdominal Wall:         Appears nml (cord
                                                                       insert, abd wall)
 Nuchal Fold:           Appears normal         Cord Vessels:           Appears normal (3
                                                                       vessel cord)
 Face:                  Appears normal         Kidneys:                Appear normal
                        (orbits and profile)
 Lips:                  Appears normal         Bladder:                Appears normal
 Thoracic:              Appears normal         Spine:                  Ltd views no
                                                                       intracranial signs of
                                                                       NTD
 Heart:                 Appears normal         Upper Extremities:      Appears normal
                        (4CH, axis, and
                        situs)
 RVOT:                  Appears normal         Lower Extremities:      Appears normal
 LVOT:                  Not well visualized
Cervix Uterus Adnexa

 Cervix
 Length:            3.6  cm.
 Normal appearance by transabdominal scan.
Impression

 Advanced maternal age.  On cell free fetal DNA the risks of
 fetal aneuploidies are not increased.
 We performed fetal anatomy scan. No makers of
 aneuploidies or fetal structural defects are seen. Fetal
 biometry is consistent with her previously-established dates.
 Amniotic fluid is normal and good fetal activity is seen.
 Patient understands the limitations of ultrasound in detecting
 fetal anomalies.
 Maternal obesity imposes limitations on the resolution of
 images, and failure to detect fetal anomalies is more common
 in obese pregnant women. As maternal obesity makes
 clinical assessment of fetal growth difficult, we recommend
 serial growth scans until delivery.
Recommendations

 -An appointment was made for her to return in 4 weeks for
 completion of fetal anatomy.
                 GIZAACHOW

## 2018-12-04 ENCOUNTER — Other Ambulatory Visit: Payer: Self-pay

## 2018-12-04 ENCOUNTER — Telehealth (INDEPENDENT_AMBULATORY_CARE_PROVIDER_SITE_OTHER): Payer: BC Managed Care – PPO | Admitting: Obstetrics & Gynecology

## 2018-12-04 VITALS — BP 118/83 | Wt 202.0 lb

## 2018-12-04 DIAGNOSIS — O09522 Supervision of elderly multigravida, second trimester: Secondary | ICD-10-CM

## 2018-12-04 DIAGNOSIS — R519 Headache, unspecified: Secondary | ICD-10-CM | POA: Diagnosis not present

## 2018-12-04 DIAGNOSIS — O0990 Supervision of high risk pregnancy, unspecified, unspecified trimester: Secondary | ICD-10-CM

## 2018-12-04 DIAGNOSIS — Z3A18 18 weeks gestation of pregnancy: Secondary | ICD-10-CM

## 2018-12-04 DIAGNOSIS — O0992 Supervision of high risk pregnancy, unspecified, second trimester: Secondary | ICD-10-CM

## 2018-12-04 DIAGNOSIS — M9901 Segmental and somatic dysfunction of cervical region: Secondary | ICD-10-CM | POA: Diagnosis not present

## 2018-12-04 DIAGNOSIS — M62830 Muscle spasm of back: Secondary | ICD-10-CM | POA: Diagnosis not present

## 2018-12-04 DIAGNOSIS — M9902 Segmental and somatic dysfunction of thoracic region: Secondary | ICD-10-CM | POA: Diagnosis not present

## 2018-12-04 NOTE — Progress Notes (Signed)
TELEHEALTH OBSTETRICS PRENATAL VIRTUAL VIDEO VISIT ENCOUNTER NOTE  [facility]   I connected with [name] on 12/04/18 at 10:00 AM EDT by MyChart Video Encounter at home and verified that I am speaking with the correct person using two identifiers.   I discussed the limitations, risks, security and privacy concerns of performing an evaluation and management service virtually and the availability of in person appointments. I also discussed with the patient that there may be a patient responsible charge related to this service. The patient expressed understanding and agreed to proceed. Subjective:  [name] is a 43 y.o. G4P1021 at 461w6d being seen today for ongoing prenatal care.  She is currently monitored for the following issues for this high-risk pregnancy and has Depression, recurrent (HCC); MDD (major depressive disorder); Supervision of high risk pregnancy, antepartum; AMA (advanced maternal age) multigravida 35+; and Obesity in pregnancy on their problem list.  Patient reports no complaints.  Contractions: Irritability. Vag. Bleeding: None.  Movement: Present. Denies any leaking of fluid.   The following portions of the patient's history were reviewed and updated as appropriate: allergies, current medications, past family history, past medical history, past social history, past surgical history and problem list.   Objective:   Vitals:   12/04/18 1001  BP: 118/83  Weight: 202 lb (91.6 kg)    Fetal Status:     Movement: Present     General:  Alert, oriented and cooperative. Patient is in no acute distress.  Respiratory: Normal respiratory effort, no problems with respiration noted  Mental Status: Normal mood and affect. Normal behavior. Normal judgment and thought content.  Rest of physical exam deferred due to type of encounter  Imaging: [place] Mfm Ob Detail +14 Wk  Result Date: 11/27/2018  ----------------------------------------------------------------------  OBSTETRICS REPORT                       (Signed Final 11/27/2018 01:52 pm) ---------------------------------------------------------------------- Patient Info  ID #:       [id number]                          D.O.B.:  1976-01-31 (43 yrs)  Name:       [name]            Visit Date: 11/27/2018 10:26 am ---------------------------------------------------------------------- Performed By  Performed By:     Sandi MealyJovancia Adrien        [address]     [address]                                                             [address]  Attending:        Noralee Spaceavi Shankar MD        [facility]  Referred By:      Federico Flake MD ---------------------------------------------------------------------- Orders   #  Description                          Code         Ordered By   1  [place] MFM OB DETAIL +14 WK              76811.01     [facility] [place]  ----------------------------------------------------------------------   #  Order #                    Accession #                 Episode #   1  [id number]                  [id number]                  [id number]  ---------------------------------------------------------------------- Indications   Encounter for antenatal screening for          Z36.3   malformations (LOW RISK NIPS)   Obesity complicating pregnancy                 O99.210 E66.9   (PREGRAVIDA BMI 33)   Advanced maternal age multigravida 1+,        O17.522   second trimester   [redacted] weeks gestation of pregnancy                Z3A.17  ---------------------------------------------------------------------- Fetal Evaluation  Num Of Fetuses:         1  Fetal Heart Rate(bpm):  138  Cardiac Activity:       Observed  Presentation:           Variable  Placenta:               Anterior  P. Cord Insertion:      Visualized   Amniotic Fluid  AFI FV:      Within normal limits                              Largest Pocket(cm)                              4.17 ---------------------------------------------------------------------- Biometry  BPD:      38.5  mm     G. Age:  17w 5d         51  %    CI:        67.12   %    70 - 86                                                          FL/HC:      18.9   %    15.8 - 18  HC:      150.5  mm     G. Age:  18w 1d         62  %    HC/AC:  1.15        1.07 - 1.29  AC:      130.6  mm     G. Age:  18w 4d         77  %    FL/BPD:     73.8   %  FL:       28.4  mm     G. Age:  18w 5d         80  %    FL/AC:      21.7   %    20 - 24  HUM:      25.2  mm     G. Age:  18w 0d         61  %  CER:      18.7  mm     G. Age:  18w 2d         66  %  NFT:       4.1  mm  LV:        4.3  mm  CM:        3.1  mm  Est. FW:     246  gm      0 lb 9 oz     91  % ---------------------------------------------------------------------- OB History  Gravidity:    4         Term:   1         SAB:   2  Living:       1 ---------------------------------------------------------------------- Gestational Age  LMP:           17w 6d        Date:  07/25/18                 EDD:   05/01/19  U/S Today:     18w 2d                                        EDD:   04/28/19  Best:          17w 5d     Det. By:  Previous Ultrasound      EDD:   05/02/19                                      (09/18/18) ---------------------------------------------------------------------- Anatomy  Cranium:               Appears normal         Aortic Arch:            Appears normal  Cavum:                 Appears normal         Ductal Arch:            Not well visualized  Ventricles:            Appears normal         Diaphragm:              Appears normal  Choroid Plexus:        Appears normal         Stomach:                Appears normal, left  sided  Cerebellum:            Appears normal          Abdomen:                Appears normal  Posterior Fossa:       Appears normal         Abdominal Wall:         Appears nml (cord                                                                        insert, abd wall)  Nuchal Fold:           Appears normal         Cord Vessels:           Appears normal (3                                                                        vessel cord)  Face:                  Appears normal         Kidneys:                Appear normal                         (orbits and profile)  Lips:                  Appears normal         Bladder:                Appears normal  Thoracic:              Appears normal         Spine:                  Ltd views no                                                                        intracranial signs of                                                                        NTD  Heart:                 Appears normal  Upper Extremities:      Appears normal                         (4CH, axis, and                         situs)  RVOT:                  Appears normal         Lower Extremities:      Appears normal  LVOT:                  Not well visualized ---------------------------------------------------------------------- Cervix Uterus Adnexa  Cervix  Length:            3.6  cm.  Normal appearance by transabdominal scan. ---------------------------------------------------------------------- Impression  Advanced maternal age.  On cell free fetal DNA the risks of  fetal aneuploidies are not increased.  We performed fetal anatomy scan. No makers of  aneuploidies or fetal structural defects are seen. Fetal  biometry is consistent with her previously-established dates.  Amniotic fluid is normal and good fetal activity is seen.  Patient understands the limitations of ultrasound in detecting  fetal anomalies.  Maternal obesity imposes limitations on the resolution of  images, and failure to detect fetal anomalies is more common  in obese pregnant  women. As maternal obesity makes  clinical assessment of fetal growth difficult, we recommend  serial growth scans until delivery. ---------------------------------------------------------------------- Recommendations  -An appointment was made for her to return in 4 weeks for  completion of fetal anatomy. ----------------------------------------------------------------------                  Tama High, MD Electronically Signed Final Report   11/27/2018 01:52 pm ----------------------------------------------------------------------   Assessment and Plan:  Pregnancy: [id number] at [redacted]w[redacted]d 1. Multigravida of advanced maternal age in second trimester Ultrasound reviewed. Will get serial scans, antenatal testing later in pregnancy, delivery 39-40 weeks.  Counseled about AFP, she will get this during next appointment at [place] at [redacted]w[redacted]d on 12/25/18. - AFP, Serum, Open Spina Bifida; Future  2. Supervision of high risk pregnancy, antepartum Preterm labor symptoms and general obstetric precautions including but not limited to vaginal bleeding, contractions, leaking of fluid and fetal movement were reviewed in detail with the patient. I discussed the assessment and treatment plan with the patient. The patient was provided an opportunity to ask questions and all were answered. The patient agreed with the plan and demonstrated an understanding of the instructions. The patient was advised to call back or seek an in-person office evaluation/go to [facility]U at [facility] for any urgent or concerning symptoms. Please refer to After Visit Summary for other counseling recommendations.   I provided 15 minutes of face-to-face time during this encounter.  Return in about 4 weeks (around 01/01/2019) for Virtual OB Visit   **NEEDS LAB APPT AT [facility] ON 12/25/18 (has MFM scan), wants AFP screen (order in)**.  Future Appointments  Date Time Provider [place]  12/25/2018 10:30 AM [place] NURSE [place] [facility]   12/25/2018 10:30 AM [place] 1 [facility]  01/01/2019 10:15 AM Daegon Deiss, Sallyanne Havers, MD [facility]    Verita Schneiders, MD [facility] [place]

## 2018-12-04 NOTE — Patient Instructions (Signed)

## 2018-12-04 NOTE — Progress Notes (Signed)
I connected with  [name] on 12/04/18 at 10:00 AM EDT by telephone and verified that I am speaking with the correct person using two identifiers.   I discussed the limitations, risks, security and privacy concerns of performing an evaluation and management service by telephone and the availability of in person appointments. I also discussed with the patient that there may be a patient responsible charge related to this service. The patient expressed understanding and agreed to proceed.  Crosby Oyster, RN 12/04/2018  10:03 AM

## 2018-12-11 DIAGNOSIS — F411 Generalized anxiety disorder: Secondary | ICD-10-CM | POA: Diagnosis not present

## 2018-12-25 ENCOUNTER — Other Ambulatory Visit: Payer: Self-pay

## 2018-12-25 ENCOUNTER — Ambulatory Visit (HOSPITAL_COMMUNITY): Payer: BC Managed Care – PPO | Admitting: *Deleted

## 2018-12-25 ENCOUNTER — Ambulatory Visit (HOSPITAL_COMMUNITY)
Admission: RE | Admit: 2018-12-25 | Discharge: 2018-12-25 | Disposition: A | Discharge status: Home / Self Care | Payer: BC Managed Care – PPO | Source: Ambulatory Visit | Attending: Obstetrics and Gynecology | Admitting: Obstetrics and Gynecology

## 2018-12-25 ENCOUNTER — Other Ambulatory Visit (HOSPITAL_COMMUNITY): Payer: Self-pay | Admitting: *Deleted

## 2018-12-25 ENCOUNTER — Other Ambulatory Visit: Payer: Self-pay | Admitting: Obstetrics & Gynecology

## 2018-12-25 ENCOUNTER — Encounter (HOSPITAL_COMMUNITY): Payer: Self-pay

## 2018-12-25 VITALS — BP 115/75 | HR 88 | Temp 99.0°F

## 2018-12-25 DIAGNOSIS — Z3A21 21 weeks gestation of pregnancy: Secondary | ICD-10-CM

## 2018-12-25 DIAGNOSIS — E669 Obesity, unspecified: Secondary | ICD-10-CM

## 2018-12-25 DIAGNOSIS — O99212 Obesity complicating pregnancy, second trimester: Secondary | ICD-10-CM

## 2018-12-25 DIAGNOSIS — Z362 Encounter for other antenatal screening follow-up: Secondary | ICD-10-CM

## 2018-12-25 DIAGNOSIS — O09529 Supervision of elderly multigravida, unspecified trimester: Secondary | ICD-10-CM

## 2018-12-25 DIAGNOSIS — O09522 Supervision of elderly multigravida, second trimester: Secondary | ICD-10-CM | POA: Diagnosis not present

## 2018-12-25 IMAGING — US US MFM OB FOLLOW-UP
1 series · 13 of 28 positions shown · non-contrast
Comparison: none

[Series 1: us mfm ob follow-up · 13 of 111 slices shown]
[im 5/111]
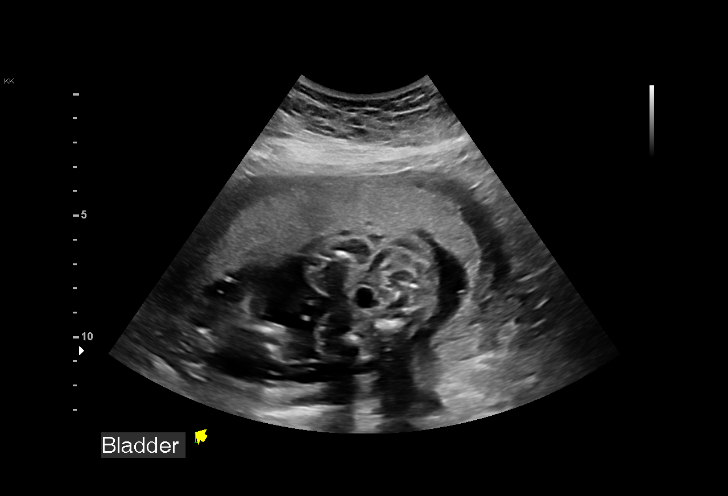
[im 13/111]
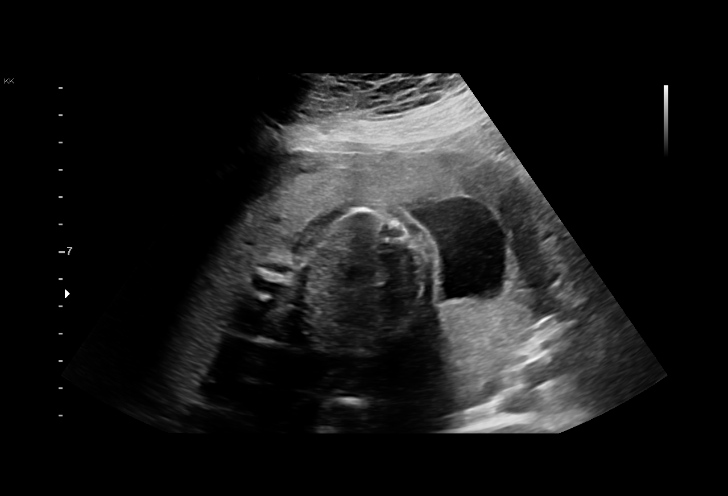
[im 21/111]
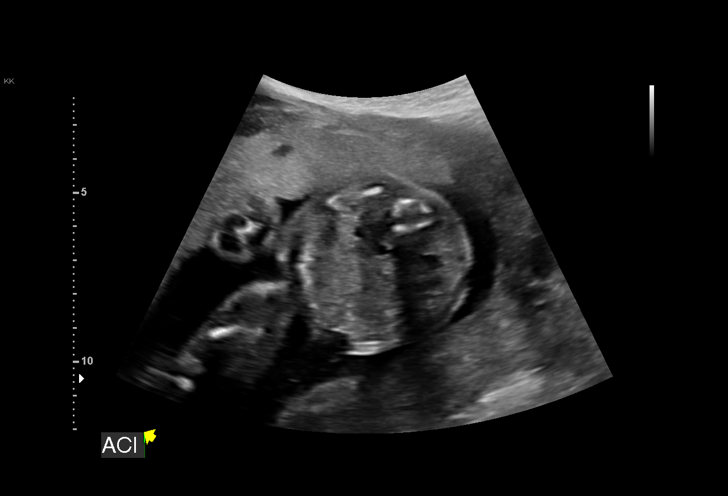
[im 29/111]
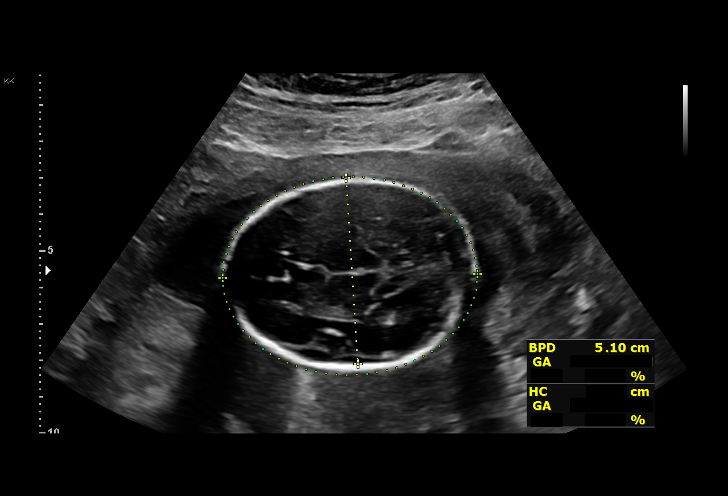
[im 37/111]
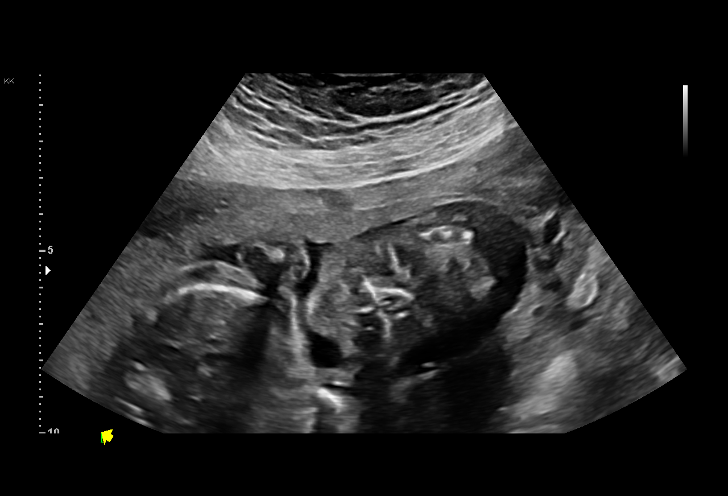
[im 45/111]
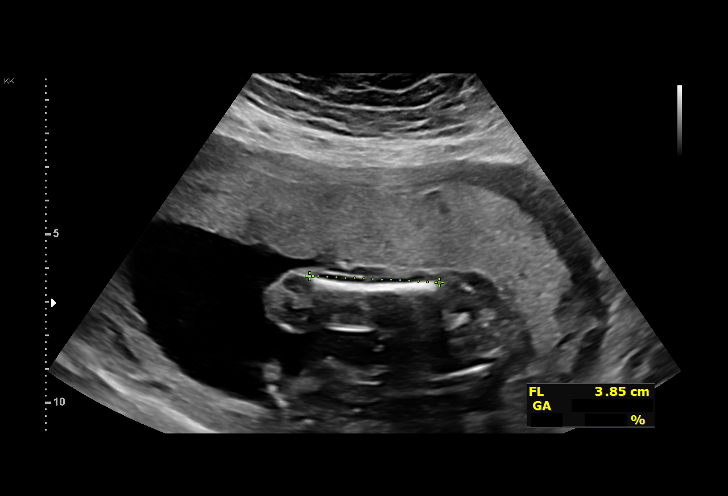
[im 58/111]
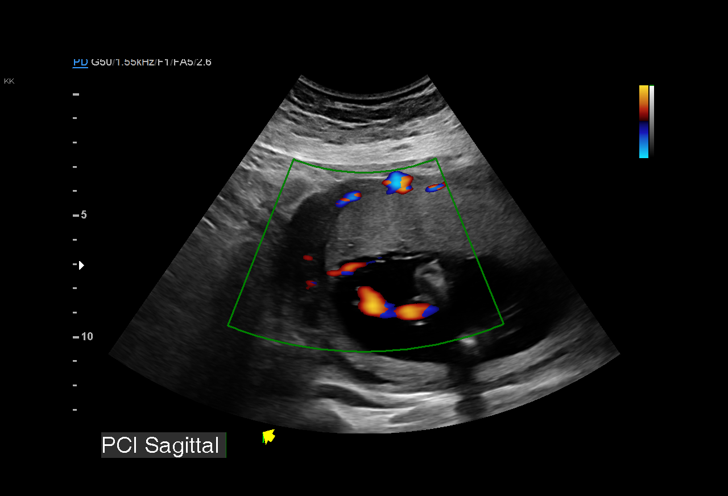
[im 66/111]
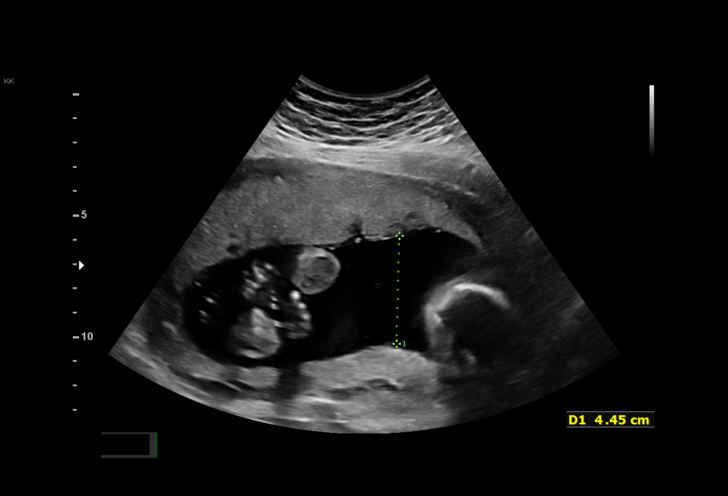
[im 74/111]
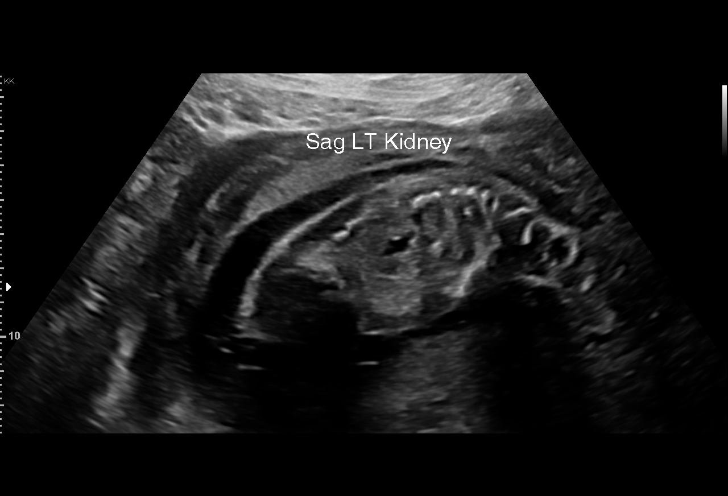
[im 82/111]
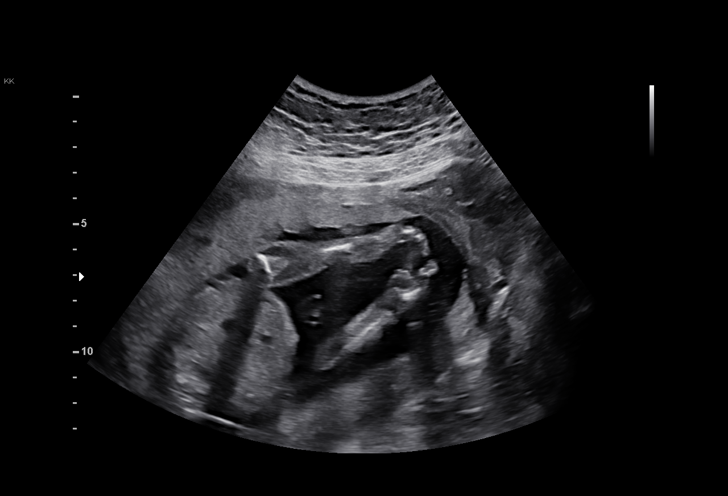
[im 90/111]
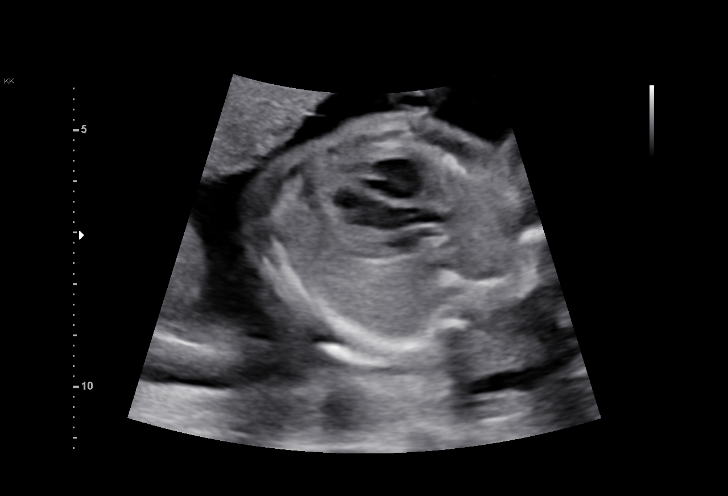
[im 98/111]
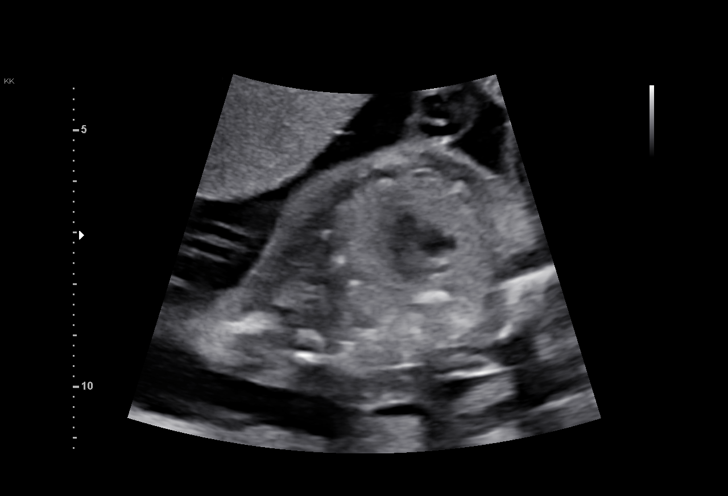
[im 106/111]
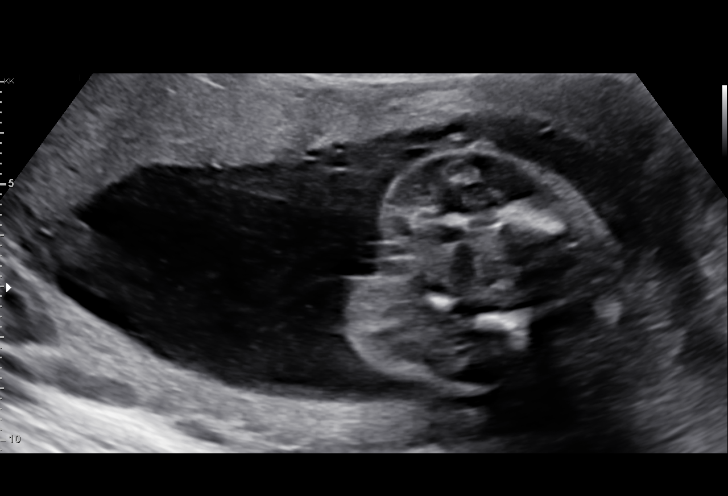

[13 of 28 positions shown; findings below may reference images not displayed]

----------------------------------------------------------------------

 ----------------------------------------------------------------------
Indications

  Advanced maternal age multigravida 35+,        [BK]
  second trimester (43 yrs)
  Obesity complicating pregnancy                 [BK] [BK]
  (PREGRAVIDA BMI 33)
  21 weeks gestation of pregnancy
  Encounter for nuchal translucency              [BK]
  Low Risk NIPS
 ----------------------------------------------------------------------
Fetal Evaluation

 Num Of Fetuses:         1
 Fetal Heart Rate(bpm):  153
 Cardiac Activity:       Observed
 Presentation:           Cephalic
 Placenta:               Anterior
 P. Cord Insertion:      Marginal insertion

 Amniotic Fluid
 AFI FV:      Within normal limits

                             Largest Pocket(cm)

Biometry

 BPD:      50.9  mm     G. Age:  21w 3d         36  %    CI:        67.44   %    70 - 86
                                                         FL/HC:      19.2   %    18.4 -
 HC:      198.4  mm     G. Age:  22w 0d         52  %    HC/AC:      1.07        1.06 -
 AC:       185   mm     G. Age:  23w 2d         88  %    FL/BPD:     74.7   %    71 - 87
 FL:         38  mm     G. Age:  22w 1d         55  %    FL/AC:      20.5   %    20 - 24

 Est. FW:     523  gm      1 lb 2 oz     88  %
OB History

 Gravidity:    4         Term:   1         SAB:   2
 Living:       1
Gestational Age

 LMP:           21w 6d        Date:  [DATE]                 EDD:   [DATE]
 U/S Today:     22w 2d                                        EDD:   [DATE]
 Best:          21w 5d     Det. By:  Previous Ultrasound      EDD:   [DATE]
                                     ([DATE])
Anatomy

 Cranium:               Appears normal         Aortic Arch:            Appears normal
 Cavum:                 Appears normal         Ductal Arch:            Not well visualized
 Ventricles:            Appears normal         Diaphragm:              Appears normal
 Choroid Plexus:        Appears normal         Stomach:                Appears normal, left
                                                                       sided
 Cerebellum:            Appears normal         Abdomen:                Appears normal
 Posterior Fossa:       Appears normal         Abdominal Wall:         Appears nml (cord
                                                                       insert, abd wall)
 Nuchal Fold:           Previously seen        Cord Vessels:           Appears normal (3
                                                                       vessel cord)
 Face:                  Appears normal         Kidneys:                Appear normal
                        (orbits and profile)
 Lips:                  Appears normal         Bladder:                Appears normal
 Thoracic:              Appears normal         Spine:                  Appears normal
 Heart:                 Appears normal         Upper Extremities:      Previously seen
                        (4CH, axis, and
                        situs)
 RVOT:                  Appears normal         Lower Extremities:      Previously seen
 LVOT:                  Appears normal

 Other:  Technically difficult due to maternal habitus and fetal position.
Cervix Uterus Adnexa

 Cervix
 Length:            3.7  cm.
 Normal appearance by transabdominal scan.
Comments

 This patient was seen for a follow up exam as the fetal
 cardiac views were unable to be fully visualized during her
 prior exam.  She denies any problems since her last exam.
 She was informed that the fetal growth and amniotic fluid
 level appears appropriate for her gestational age.
 The views of the fetal heart were visualized today.  There
 were no obvious anomalies suspected.  The limitations of
 ultrasound in the detection of all anomalies was discussed.
 Due to advanced maternal age and a marginal placental cord
 insertion noted today, a follow-up growth scan was scheduled
 in 5 weeks.

## 2018-12-28 LAB — AFP, SERUM, OPEN SPINA BIFIDA
AFP MoM: 0.96
AFP Value: 63.8 ng/mL
Gest. Age on Collection Date: 21.9 weeks
Maternal Age At EDD: 42.5 yr
OSBR Risk 1 IN: 10000
Test Results:: NEGATIVE
Weight: 206 [lb_av]

## 2018-12-29 DIAGNOSIS — F411 Generalized anxiety disorder: Secondary | ICD-10-CM | POA: Diagnosis not present

## 2019-01-01 ENCOUNTER — Other Ambulatory Visit: Payer: Self-pay

## 2019-01-01 ENCOUNTER — Telehealth (INDEPENDENT_AMBULATORY_CARE_PROVIDER_SITE_OTHER): Payer: BC Managed Care – PPO | Admitting: Obstetrics & Gynecology

## 2019-01-01 VITALS — Wt 205.0 lb

## 2019-01-01 DIAGNOSIS — M9902 Segmental and somatic dysfunction of thoracic region: Secondary | ICD-10-CM | POA: Diagnosis not present

## 2019-01-01 DIAGNOSIS — R519 Headache, unspecified: Secondary | ICD-10-CM | POA: Diagnosis not present

## 2019-01-01 DIAGNOSIS — O09523 Supervision of elderly multigravida, third trimester: Secondary | ICD-10-CM

## 2019-01-01 DIAGNOSIS — M9901 Segmental and somatic dysfunction of cervical region: Secondary | ICD-10-CM | POA: Diagnosis not present

## 2019-01-01 DIAGNOSIS — O0990 Supervision of high risk pregnancy, unspecified, unspecified trimester: Secondary | ICD-10-CM

## 2019-01-01 DIAGNOSIS — O09522 Supervision of elderly multigravida, second trimester: Secondary | ICD-10-CM

## 2019-01-01 DIAGNOSIS — M62830 Muscle spasm of back: Secondary | ICD-10-CM | POA: Diagnosis not present

## 2019-01-01 DIAGNOSIS — Z3A22 22 weeks gestation of pregnancy: Secondary | ICD-10-CM

## 2019-01-01 DIAGNOSIS — O0992 Supervision of high risk pregnancy, unspecified, second trimester: Secondary | ICD-10-CM

## 2019-01-01 NOTE — Progress Notes (Signed)
TELEHEALTH OBSTETRICS PRENATAL VIRTUAL VIDEO VISIT ENCOUNTER NOTE  Provider location: Center for [facility]   I connected with [name] on 01/01/19 at 10:15 AM EST by MyChart Video Encounter at home and verified that I am speaking with the correct person using two identifiers.   I discussed the limitations, risks, security and privacy concerns of performing an evaluation and management service virtually and the availability of in person appointments. I also discussed with the patient that there may be a patient responsible charge related to this service. The patient expressed understanding and agreed to proceed. Subjective:  [name] is a 43 y.o. G4P1021 at [redacted]w[redacted]d being seen today for ongoing prenatal care.  She is currently monitored for the following issues for this high-risk pregnancy and has Depression, recurrent (HCC); MDD (major depressive disorder); Supervision of high risk pregnancy, antepartum; AMA (advanced maternal age) multigravida 27+; and Obesity in pregnancy on their problem list.  Patient reports no complaints.  Contractions: Not present. Vag. Bleeding: None.  Movement: Present. Denies any leaking of fluid.   The following portions of the patient's history were reviewed and updated as appropriate: allergies, current medications, past family history, past medical history, past social history, past surgical history and problem list.   Objective:   Vitals:   01/01/19 1000  Weight: 205 lb (93 kg)    Fetal Status:     Movement: Present     General:  Alert, oriented and cooperative. Patient is in no acute distress.  Respiratory: Normal respiratory effort, no problems with respiration noted  Mental Status: Normal mood and affect. Normal behavior. Normal judgment and thought content.  Rest of physical exam deferred due to type of encounter  Imaging: [place] Mfm Ob Follow Up  Result Date: 12/25/2018  ----------------------------------------------------------------------  OBSTETRICS REPORT                       (Signed Final 12/25/2018 02:44 pm) ---------------------------------------------------------------------- Patient Info  ID #:       [id number]                          D.O.B.:  01-27-1976 (43 yrs)  Name:       [name]            Visit Date: 12/25/2018 11:33 am ---------------------------------------------------------------------- Performed By  Performed By:     Emeline Darling BS,      Ref. Address:     [address]                    [address]                                                             [address]  Attending:        Ma Rings MD         [facility]  Referred By:      Caren Macadam MD ---------------------------------------------------------------------- Orders   #  Description                          Code         Ordered By   1  [place] MFM OB FOLLOW UP                  35329.92     [place]  ----------------------------------------------------------------------   #  Order #                    Accession #                 Episode #   1  [id number]                  [id number]                  [id number]  ---------------------------------------------------------------------- Indications   Advanced maternal age multigravida 81+,        O36.522   second trimester (60 yrs)   Obesity complicating pregnancy                 O99.210 E66.9   (PREGRAVIDA BMI 33)   [redacted] weeks gestation of pregnancy                Z3A.21   Encounter for nuchal translucency              Z36.82   Low Risk NIPS  ---------------------------------------------------------------------- Fetal Evaluation  Num Of Fetuses:         1  Fetal Heart Rate(bpm):  153  Cardiac Activity:       Observed  Presentation:           Cephalic  Placenta:               Anterior  P. Cord Insertion:      Marginal insertion   Amniotic Fluid  AFI FV:      Within normal limits                              Largest Pocket(cm)                              4.5 ---------------------------------------------------------------------- Biometry  BPD:      50.9  mm     G. Age:  21w 3d         36  %    CI:        67.44   %    70 - 86                                                          FL/HC:      19.2   %    18.4 - 20.2  HC:      198.4  mm     G. Age:  22w 0d  52  %    HC/AC:      1.07        1.06 - 1.25  AC:       185   mm     G. Age:  23w 2d         88  %    FL/BPD:     74.7   %    71 - 87  FL:         38  mm     G. Age:  22w 1d         55  %    FL/AC:      20.5   %    20 - 24  Est. FW:     523  gm      1 lb 2 oz     88  % ---------------------------------------------------------------------- OB History  Gravidity:    4         Term:   1         SAB:   2  Living:       1 ---------------------------------------------------------------------- Gestational Age  LMP:           21w 6d        Date:  07/25/18                 EDD:   05/01/19  U/S Today:     22w 2d                                        EDD:   04/28/19  Best:          21w 5d     Det. By:  Previous Ultrasound      EDD:   05/02/19                                      (09/18/18) ---------------------------------------------------------------------- Anatomy  Cranium:               Appears normal         Aortic Arch:            Appears normal  Cavum:                 Appears normal         Ductal Arch:            Not well visualized  Ventricles:            Appears normal         Diaphragm:              Appears normal  Choroid Plexus:        Appears normal         Stomach:                Appears normal, left                                                                        sided  Cerebellum:  Appears normal         Abdomen:                Appears normal  Posterior Fossa:       Appears normal         Abdominal Wall:         Appears nml (cord                                                                         insert, abd wall)  Nuchal Fold:           Previously seen        Cord Vessels:           Appears normal (3                                                                        vessel cord)  Face:                  Appears normal         Kidneys:                Appear normal                         (orbits and profile)  Lips:                  Appears normal         Bladder:                Appears normal  Thoracic:              Appears normal         Spine:                  Appears normal  Heart:                 Appears normal         Upper Extremities:      Previously seen                         (4CH, axis, and                         situs)  RVOT:                  Appears normal         Lower Extremities:      Previously seen  LVOT:                  Appears normal  Other:  Technically difficult due to maternal habitus and fetal position. ---------------------------------------------------------------------- Cervix Uterus Adnexa  Cervix  Length:            3.7  cm.  Normal appearance by transabdominal scan. ----------------------------------------------------------------------  Comments  This patient was seen for a follow up exam as the fetal  cardiac views were unable to be fully visualized during her  prior exam.  She denies any problems since her last exam.  She was informed that the fetal growth and amniotic fluid  level appears appropriate for her gestational age.  The views of the fetal heart were visualized today.  There  were no obvious anomalies suspected.  The limitations of  ultrasound in the detection of all anomalies was discussed.  Due to advanced maternal age and a marginal placental cord  insertion noted today, a follow-up growth scan was scheduled  in 5 weeks. ----------------------------------------------------------------------                   Ma RingsVictor Fang, MD Electronically Signed Final Report   12/25/2018 02:44 pm  ----------------------------------------------------------------------   Assessment and Plan:  Pregnancy: [id number] at 6368w6d 1. Multigravida of advanced maternal age in third trimester Low risk NIPS, AFP negative. Normal anatomy scan. Ultrasound reviewed with patient. Will continue serial scans, antenatal testing later in pregnancy.  2. Supervision of high risk pregnancy, antepartum Preterm labor symptoms and general obstetric precautions including but not limited to vaginal bleeding, contractions, leaking of fluid and fetal movement were reviewed in detail with the patient. I discussed the assessment and treatment plan with the patient. The patient was provided an opportunity to ask questions and all were answered. The patient agreed with the plan and demonstrated an understanding of the instructions. The patient was advised to call back or seek an in-person office evaluation/[facility] for any urgent or concerning symptoms. Please refer to After Visit Summary for other counseling recommendations.   I provided 15 minutes of face-to-face time during this encounter.  Return in about 26 days (around 01/27/2019) for Virtual OB Visit//2 week after that: 2 hr GTT, 3rd trimester labs, TDap, OFFICE OB Visit.  Future Appointments  Date Time Provider Department Center  01/22/2019 10:40 AM [facility] NURSE [facility]-US  01/22/2019 10:45 AM [facility] US 5 [facility]-M[facility]US M[facility]-US    Jaynie CollinsUgonna Naraly Fritcher, MD [facility], [facility]

## 2019-01-01 NOTE — Progress Notes (Signed)
I connected with  [name] on 01/01/19 at 10:15 AM EST by telephone and verified that I am speaking with the correct person using two identifiers.   I discussed the limitations, risks, security and privacy concerns of performing an evaluation and management service by telephone and the availability of in person appointments. I also discussed with the patient that there may be a patient responsible charge related to this service. The patient expressed understanding and agreed to proceed.  Crosby Oyster, RN 01/01/2019  10:01 AM   Follow up from ultrasound

## 2019-01-01 NOTE — Patient Instructions (Signed)
Return to office for any scheduled appointments. Call the office or go to the MAU at [place] [facility] if:  You begin to have strong, frequent contractions  Your water breaks.  Sometimes it is a big gush of fluid, sometimes it is just a trickle that keeps getting your panties wet or running down your legs  You have vaginal bleeding.  It is normal to have a small amount of spotting if your cervix was checked.   You do not feel your baby moving like normal.  If you do not, get something to eat and drink and lay down and focus on feeling your baby move.   If your baby is still not moving like normal, you should call the office or go to MAU.  Any other obstetric concerns.     TDaP Vaccine Pregnancy (28 weeks) Get the Whooping Cough Vaccine While You Are Pregnant (CDC)  It is important for women to get the whooping cough vaccine in the third trimester of each pregnancy. Vaccines are the best way to prevent this disease. There are 2 different whooping cough vaccines. Both vaccines combine protection against whooping cough, tetanus and diphtheria, but they are for different age groups: Tdap: for everyone 11 years or older, including pregnant women  DTaP: for children 2 months through 26 years of age  You need the whooping cough vaccine during each of your pregnancies The recommended time to get the shot is during your 27th through 36th week of pregnancy, preferably during the earlier part of this time period. The Centers for Disease Control and Prevention (CDC) recommends that pregnant women receive the whooping cough vaccine for adolescents and adults (called Tdap vaccine) during the third trimester of each pregnancy. The recommended time to get the shot is during your 27th through 36th week of pregnancy, preferably during the earlier part of this time period. This replaces the original recommendation that pregnant women get the vaccine only if they had not previously  received it. The [employer] of Obstetricians and Gynecologists and the [employer] support this recommendation.  You should get the whooping cough vaccine while pregnant to pass protection to your baby frame support disabled and/or not supported in this browser  Learn why Mickel Baas decided to get the whooping cough vaccine in her 3rd trimester of pregnancy and how her baby girl was born with some protection against the disease. Also available on YouTube. After receiving the whooping cough vaccine, your body will create protective antibodies (proteins produced by the body to fight off diseases) and pass some of them to your baby before birth. These antibodies provide your baby some short-term protection against whooping cough in early life. These antibodies can also protect your baby from some of the more serious complications that come along with whooping cough. Your protective antibodies are at their highest about 2 weeks after getting the vaccine, but it takes time to pass them to your baby. So the preferred time to get the whooping cough vaccine is early in your third trimester. The amount of whooping cough antibodies in your body decreases over time. That is why CDC recommends you get a whooping cough vaccine during each pregnancy. Doing so allows each of your babies to get the greatest number of protective antibodies from you. This means each of your babies will get the best protection possible against this disease.  Getting the whooping cough vaccine while pregnant is better than getting the vaccine after you give  birth Whooping cough vaccination during pregnancy is ideal so your baby will have short-term protection as soon as he is born. This early protection is important because your baby will not start getting his whooping cough vaccines until he is 2 months old. These first few months of life are when your baby is at greatest risk for catching whooping cough. This is  also when he's at greatest risk for having severe, potentially life-threating complications from the infection. To avoid that gap in protection, it is best to get a whooping cough vaccine during pregnancy. You will then pass protection to your baby before he is born. To continue protecting your baby, he should get whooping cough vaccines starting at 2 months old. You may never have gotten the Tdap vaccine before and did not get it during this pregnancy. If so, you should make sure to get the vaccine immediately after you give birth, before leaving the hospital or birthing center. It will take about 2 weeks before your body develops protection (antibodies) in response to the vaccine. Once you have protection from the vaccine, you are less likely to give whooping cough to your newborn while caring for him. But remember, your baby will still be at risk for catching whooping cough from others. A recent study looked to see how effective Tdap was at preventing whooping cough in babies whose mothers got the vaccine while pregnant or in the hospital after giving birth. The study found that getting Tdap between 27 through 36 weeks of pregnancy is 85% more effective at preventing whooping cough in babies younger than 2 months old. Blood tests cannot tell if you need a whooping cough vaccine There are no blood tests that can tell you if you have enough antibodies in your body to protect yourself or your baby against whooping cough. Even if you have been sick with whooping cough in the past or previously received the vaccine, you still should get the vaccine during each pregnancy. Breastfeeding may pass some protective antibodies onto your baby By breastfeeding, you may pass some antibodies you have made in response to the vaccine to your baby. When you get a whooping cough vaccine during your pregnancy, you will have antibodies in your breast milk that you can share with your baby as soon as your milk comes in. However,  your baby will not get protective antibodies immediately if you wait to get the whooping cough vaccine until after delivering your baby. This is because it takes about 2 weeks for your body to create antibodies. Learn more about the health benefits of breastfeeding.

## 2019-01-08 DIAGNOSIS — F411 Generalized anxiety disorder: Secondary | ICD-10-CM | POA: Diagnosis not present

## 2019-01-20 DIAGNOSIS — R519 Headache, unspecified: Secondary | ICD-10-CM | POA: Diagnosis not present

## 2019-01-20 DIAGNOSIS — M62830 Muscle spasm of back: Secondary | ICD-10-CM | POA: Diagnosis not present

## 2019-01-20 DIAGNOSIS — M9902 Segmental and somatic dysfunction of thoracic region: Secondary | ICD-10-CM | POA: Diagnosis not present

## 2019-01-20 DIAGNOSIS — M9901 Segmental and somatic dysfunction of cervical region: Secondary | ICD-10-CM | POA: Diagnosis not present

## 2019-01-22 ENCOUNTER — Ambulatory Visit (HOSPITAL_COMMUNITY)
Admission: RE | Admit: 2019-01-22 | Discharge: 2019-01-22 | Disposition: A | Discharge status: Home / Self Care | Payer: BC Managed Care – PPO | Source: Ambulatory Visit | Attending: Obstetrics and Gynecology | Admitting: Obstetrics and Gynecology

## 2019-01-22 ENCOUNTER — Other Ambulatory Visit: Payer: Self-pay

## 2019-01-22 ENCOUNTER — Other Ambulatory Visit (HOSPITAL_COMMUNITY): Payer: Self-pay | Admitting: *Deleted

## 2019-01-22 ENCOUNTER — Encounter (HOSPITAL_COMMUNITY): Payer: Self-pay

## 2019-01-22 ENCOUNTER — Ambulatory Visit (HOSPITAL_COMMUNITY): Payer: BC Managed Care – PPO | Admitting: *Deleted

## 2019-01-22 VITALS — BP 130/87 | HR 87 | Temp 98.4°F

## 2019-01-22 DIAGNOSIS — O99212 Obesity complicating pregnancy, second trimester: Secondary | ICD-10-CM | POA: Diagnosis not present

## 2019-01-22 DIAGNOSIS — Z3A25 25 weeks gestation of pregnancy: Secondary | ICD-10-CM | POA: Diagnosis not present

## 2019-01-22 DIAGNOSIS — O09522 Supervision of elderly multigravida, second trimester: Secondary | ICD-10-CM

## 2019-01-22 DIAGNOSIS — Z362 Encounter for other antenatal screening follow-up: Secondary | ICD-10-CM | POA: Diagnosis not present

## 2019-01-22 DIAGNOSIS — O09529 Supervision of elderly multigravida, unspecified trimester: Secondary | ICD-10-CM

## 2019-01-22 IMAGING — US US MFM OB FOLLOW-UP
1 series · 13 of 27 positions shown · non-contrast
Comparison: none

[Series 1: us mfm ob follow-up · 13 of 27 slices shown]
[im 2/27]
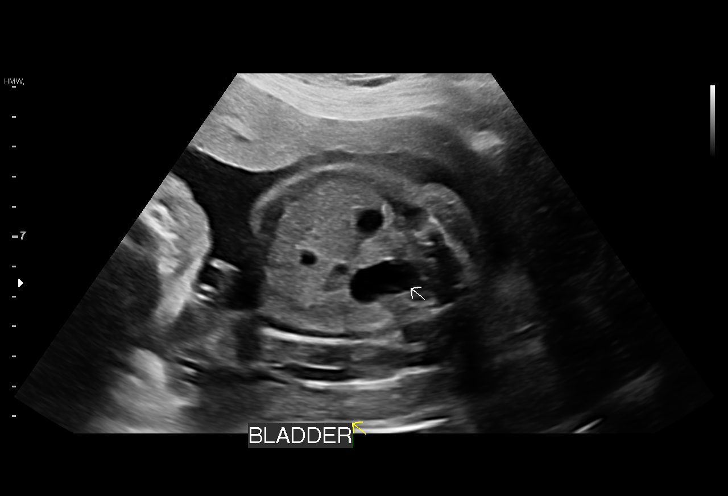
[im 4/27]
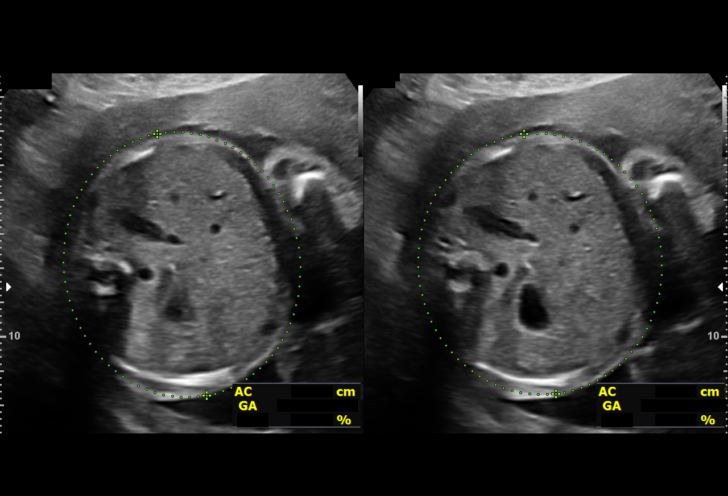
[im 6/27]
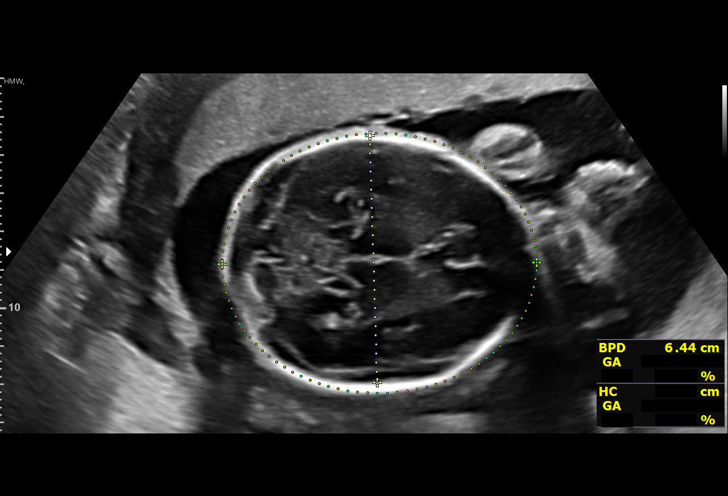
[im 8/27]
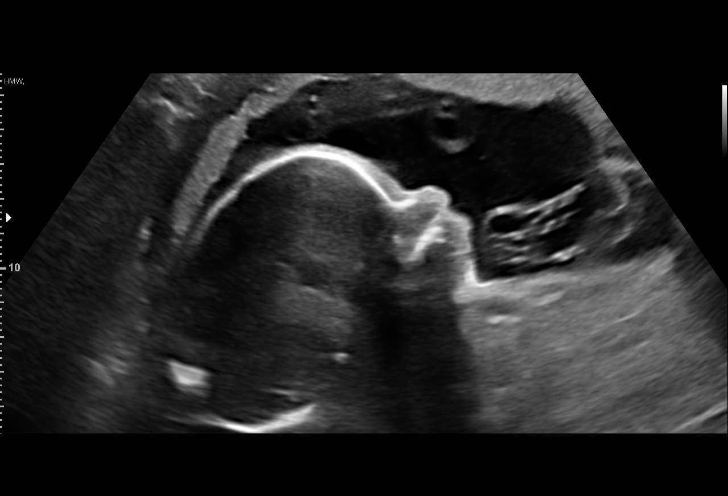
[im 10/27]
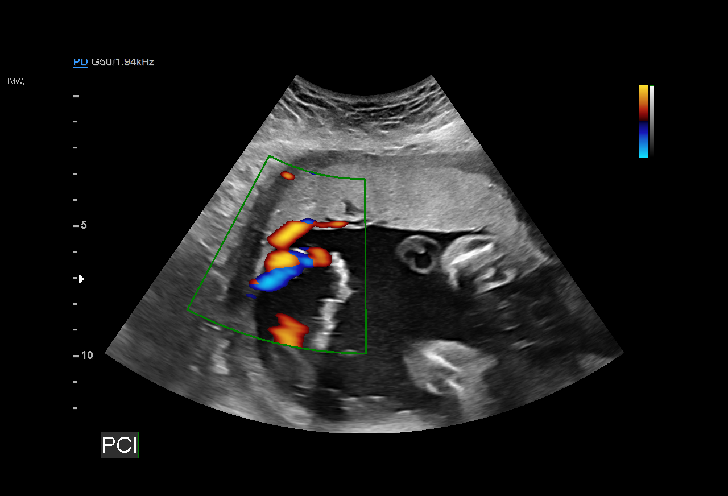
[im 12/27]
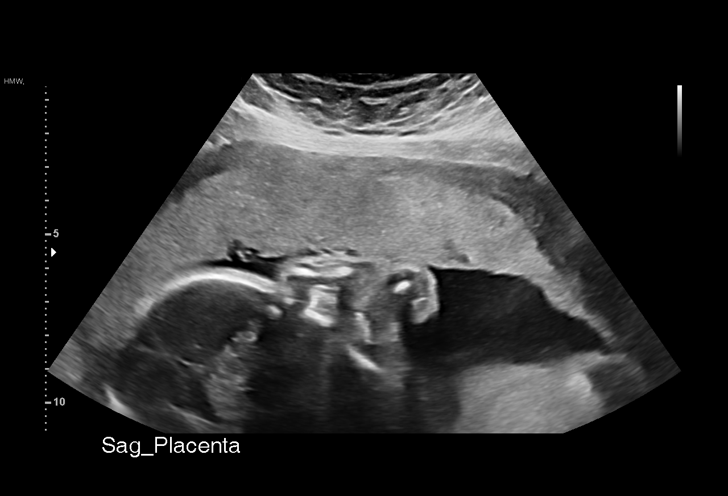
[im 14/27]
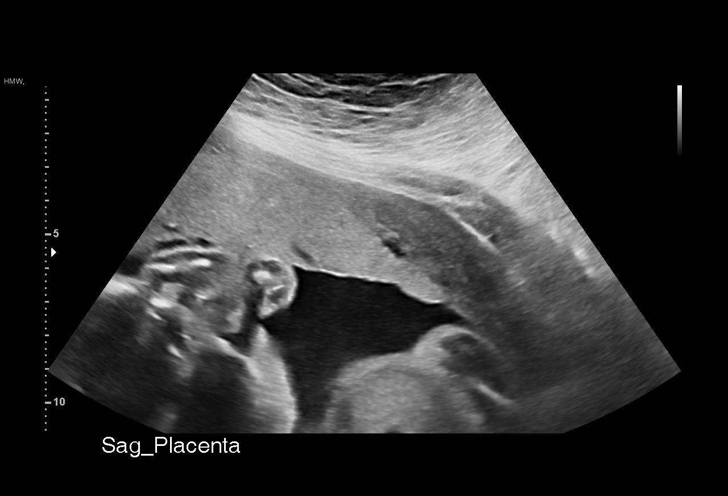
[im 16/27]
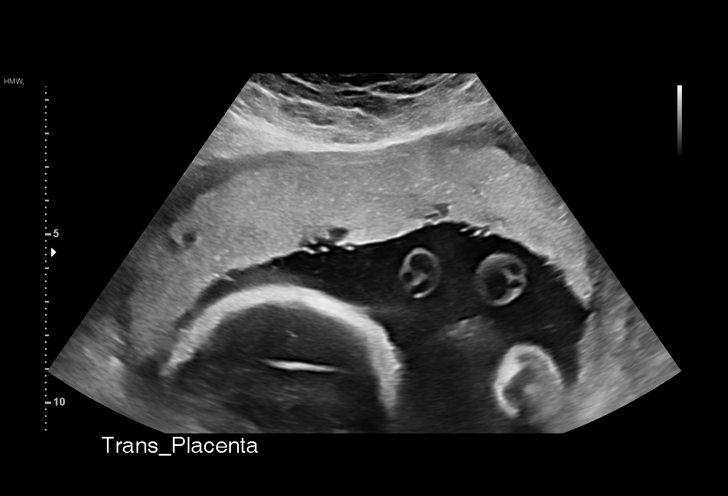
[im 18/27]
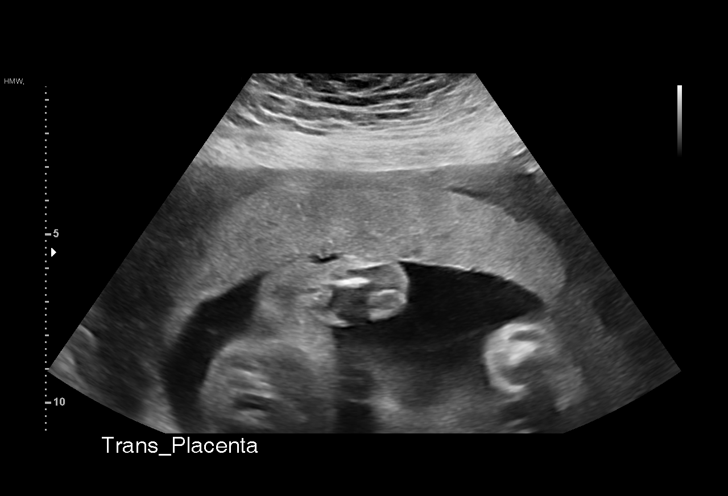
[im 20/27]
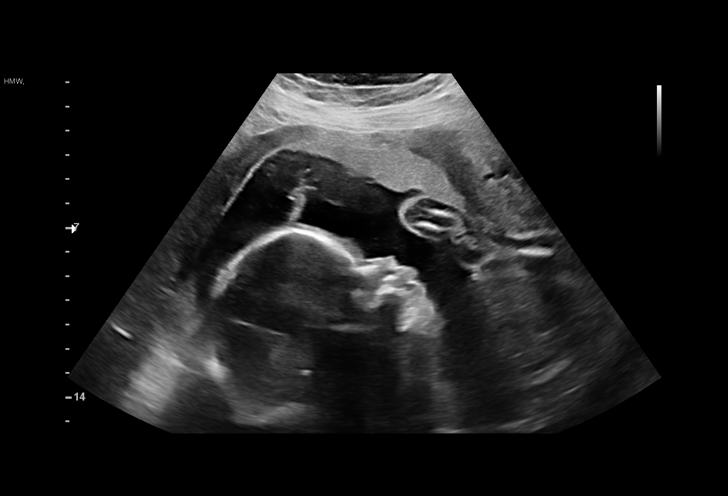
[im 22/27]
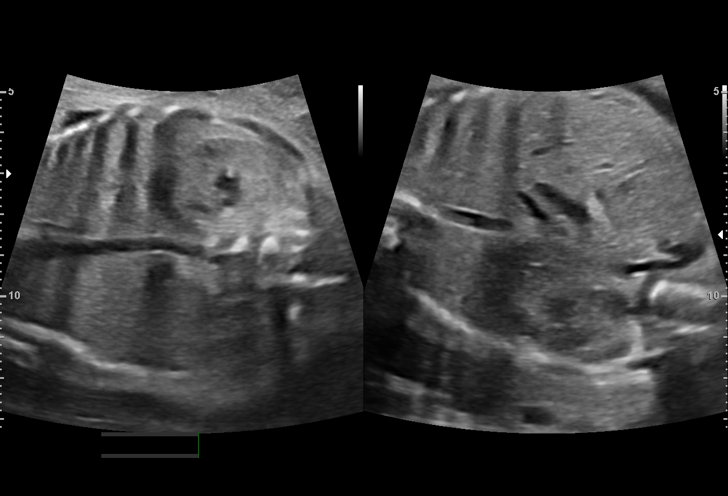
[im 24/27]
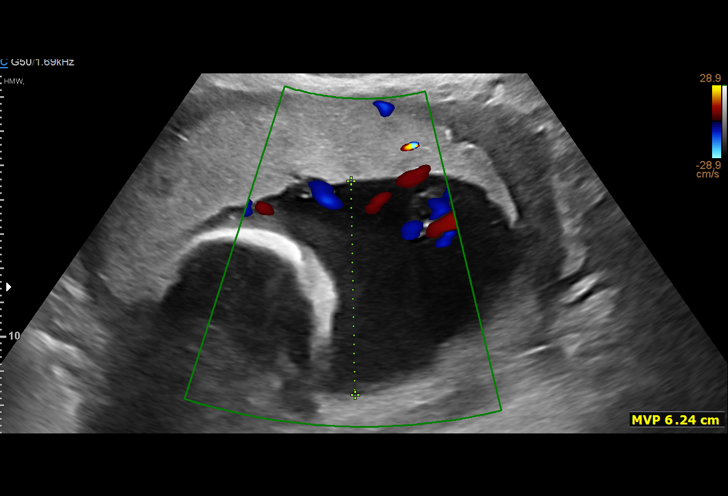
[im 26/27]
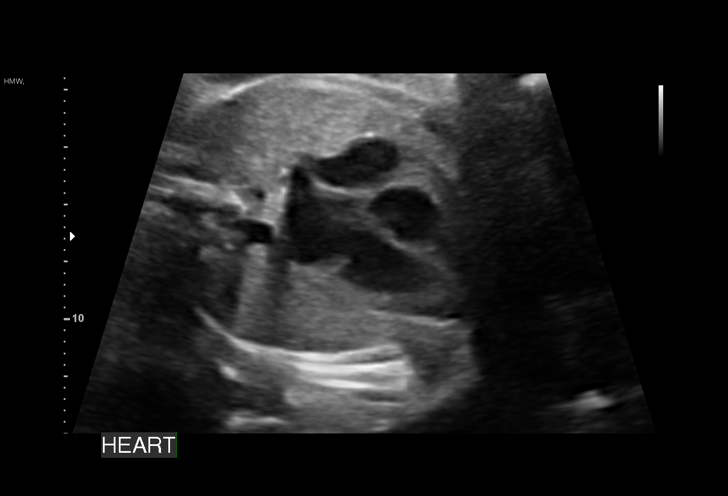

[13 of 27 positions shown; findings below may reference images not displayed]

----------------------------------------------------------------------

 ----------------------------------------------------------------------
Indications

  25 weeks gestation of pregnancy
  Advanced maternal age multigravida 35+,        [10]
  second trimester (43 yrs)
  Obesity complicating pregnancy                 [10] [10]
  (PREGRAVIDA BMI 33)
  Low Risk NIPS
  Encounter for other antenatal screening        [10]
  follow-up
 ----------------------------------------------------------------------
Fetal Evaluation

 Num Of Fetuses:         1
 Fetal Heart Rate(bpm):  157
 Cardiac Activity:       Observed
 Presentation:           Breech
 Placenta:               Anterior
 P. Cord Insertion:      Marginal insertion

 Amniotic Fluid
 AFI FV:      Within normal limits

                             Largest Pocket(cm)

Biometry

 BPD:      64.7  mm     G. Age:  26w 1d         56  %    CI:        74.79   %    70 - 86
                                                         FL/HC:      20.7   %    18.6 -
 HC:      237.4  mm     G. Age:  25w 6d         29  %    HC/AC:      1.03        1.04 -
 AC:       231   mm     G. Age:  27w 3d         88  %    FL/BPD:     75.9   %    71 - 87
 FL:       49.1  mm     G. Age:  26w 4d         62  %    FL/AC:      21.3   %    20 - 24
 HUM:      40.8  mm     G. Age:  24w 5d         20  %

 Est. FW:     997  gm      2 lb 3 oz     86  %
OB History

 Gravidity:    4         Term:   1         SAB:   2
 Living:       1
Gestational Age

 LMP:           25w 6d        Date:  [DATE]                 EDD:   [DATE]
 U/S Today:     26w 4d                                        EDD:   [DATE]
 Best:          25w 5d     Det. By:  Previous Ultrasound      EDD:   [DATE]
                                     ([DATE])
Anatomy

 Cranium:               Appears normal         Aortic Arch:            Previously seen
 Cavum:                 Previously seen        Ductal Arch:            Not well visualized
 Ventricles:            Appears normal         Diaphragm:              Previously seen
 Choroid Plexus:        Previously seen        Stomach:                Appears normal, left
                                                                       sided
 Cerebellum:            Previously seen        Abdomen:                Previously seen
 Posterior Fossa:       Previously seen        Abdominal Wall:         Previously seen
 Nuchal Fold:           Previously seen        Cord Vessels:           Previously seen
 Face:                  Orbits and profile     Kidneys:                Appear normal
                        previously seen
 Lips:                  Previously seen        Bladder:                Appears normal
 Thoracic:              Appears normal         Spine:                  Previously seen
 Heart:                 Previously seen        Upper Extremities:      Previously seen
 RVOT:                  Previously seen        Lower Extremities:      Previously seen
 LVOT:                  Previously seen

 Other:  Technically difficult due to maternal habitus and fetal position.
Cervix Uterus Adnexa

 Cervix
 Not visualized (advanced GA >[10])

 Uterus
 No abnormality visualized.

 Left Ovary
 No adnexal mass visualized.

 Right Ovary
 No adnexal mass visualized.

 Cul De Sac
 No free fluid seen.

 Adnexa
 No abnormality visualized.
Impression

 Marginal cord insertion.
 Fetal growth is appropriate for gestational age. Amniotic fluid
 is normal and good fetal activity is seen.
Recommendations

 -An appointment was made for her to return in 4 weeks for
 fetal growth assessment.
                 TULIO

## 2019-01-29 ENCOUNTER — Telehealth (INDEPENDENT_AMBULATORY_CARE_PROVIDER_SITE_OTHER): Payer: BC Managed Care – PPO | Admitting: Obstetrics and Gynecology

## 2019-01-29 ENCOUNTER — Other Ambulatory Visit: Payer: Self-pay

## 2019-01-29 VITALS — BP 134/81

## 2019-01-29 DIAGNOSIS — Z3A26 26 weeks gestation of pregnancy: Secondary | ICD-10-CM

## 2019-01-29 DIAGNOSIS — O99212 Obesity complicating pregnancy, second trimester: Secondary | ICD-10-CM

## 2019-01-29 DIAGNOSIS — O0990 Supervision of high risk pregnancy, unspecified, unspecified trimester: Secondary | ICD-10-CM

## 2019-01-29 DIAGNOSIS — O09522 Supervision of elderly multigravida, second trimester: Secondary | ICD-10-CM

## 2019-01-29 DIAGNOSIS — O99210 Obesity complicating pregnancy, unspecified trimester: Secondary | ICD-10-CM

## 2019-01-29 DIAGNOSIS — F329 Major depressive disorder, single episode, unspecified: Secondary | ICD-10-CM

## 2019-01-29 DIAGNOSIS — O09523 Supervision of elderly multigravida, third trimester: Secondary | ICD-10-CM

## 2019-01-29 DIAGNOSIS — O0992 Supervision of high risk pregnancy, unspecified, second trimester: Secondary | ICD-10-CM

## 2019-01-29 NOTE — Progress Notes (Signed)
   TELEHEALTH VIRTUAL OBSTETRICS VISIT ENCOUNTER NOTE  Clinic: Center for [facility]  I connected with Danaya Geddis Horton on 01/29/19 at 10:15 AM EST by telephone at home and verified that I am speaking with the correct person using two identifiers.   I discussed the limitations, risks, security and privacy concerns of performing an evaluation and management service by telephone and the availability of in person appointments. I also discussed with the patient that there may be a patient responsible charge related to this service. The patient expressed understanding and agreed to proceed.  Subjective:  [name] is a 43 y.o. G4P1021 at [redacted]w[redacted]d being followed for ongoing prenatal care.  She is currently monitored for the following issues for this high-risk pregnancy and has Depression, recurrent ([place]); MDD (major depressive disorder); Supervision of high risk pregnancy, antepartum; AMA (advanced maternal age) multigravida 81+; and Obesity in pregnancy on their problem list.  Patient reports no complaints. Reports fetal movement. Denies any contractions, bleeding or leaking of fluid.   The following portions of the patient's history were reviewed and updated as appropriate: allergies, current medications, past family history, past medical history, past social history, past surgical history and problem list.   Objective:   Vitals:   01/29/19 1016  BP: 134/81    Babyscripts Data Reviewed: yes  General:  Alert, oriented and cooperative.   Mental Status: Normal mood and affect perceived. Normal judgment and thought content.  Rest of physical exam deferred due to type of encounter  Assessment and Plan:  Pregnancy: G4P1021 at [redacted]w[redacted]d 1. Supervision of high risk pregnancy, antepartum Routine care. Will set up for lab only 2h visit in next week or two  2. Multigravida of advanced maternal age in third trimester Normal growth u/s Has rpt already scheduled.   Start ap testing at [place]  3. Obesity in pregnancy   Preterm labor symptoms and general obstetric precautions including but not limited to vaginal bleeding, contractions, leaking of fluid and fetal movement were reviewed in detail with the patient.  I discussed the assessment and treatment plan with the patient. The patient was provided an opportunity to ask questions and all were answered. The patient agreed with the plan and demonstrated an understanding of the instructions. The patient was advised to call back or seek an in-person office evaluation/go to MAU at [facility] for any urgent or concerning symptoms. Please refer to After Visit Summary for other counseling recommendations.   I provided 7 minutes of non-face-to-face time during this encounter. The visit was conducted via MyChart-medicine  Return for 2hr GTT.  Future Appointments  Date Time Provider [place]  02/19/2019 10:40 AM [place] [facility]-US  02/19/2019 10:45 AM [place] 2 [facility] M[facility]-US  02/26/2019  8:15 AM Aletha Halim, MD [facility]    Aletha Halim, MD Center for [facility], [place]

## 2019-01-29 NOTE — Progress Notes (Signed)
I connected with  [name] on 01/29/19 at 10:15 AM EST by telephone and verified that I am speaking with the correct person using two identifiers.   I discussed the limitations, risks, security and privacy concerns of performing an evaluation and management service by telephone and the availability of in person appointments. I also discussed with the patient that there may be a patient responsible charge related to this service. The patient expressed understanding and agreed to proceed.  Crosby Oyster, RN 01/29/2019  10:17 AM

## 2019-02-02 DIAGNOSIS — M5416 Radiculopathy, lumbar region: Secondary | ICD-10-CM | POA: Diagnosis not present

## 2019-02-02 DIAGNOSIS — M62830 Muscle spasm of back: Secondary | ICD-10-CM | POA: Diagnosis not present

## 2019-02-02 DIAGNOSIS — M9903 Segmental and somatic dysfunction of lumbar region: Secondary | ICD-10-CM | POA: Diagnosis not present

## 2019-02-02 DIAGNOSIS — M9905 Segmental and somatic dysfunction of pelvic region: Secondary | ICD-10-CM | POA: Diagnosis not present

## 2019-02-04 DIAGNOSIS — M5416 Radiculopathy, lumbar region: Secondary | ICD-10-CM | POA: Diagnosis not present

## 2019-02-04 DIAGNOSIS — M62830 Muscle spasm of back: Secondary | ICD-10-CM | POA: Diagnosis not present

## 2019-02-04 DIAGNOSIS — M9903 Segmental and somatic dysfunction of lumbar region: Secondary | ICD-10-CM | POA: Diagnosis not present

## 2019-02-04 DIAGNOSIS — M9905 Segmental and somatic dysfunction of pelvic region: Secondary | ICD-10-CM | POA: Diagnosis not present

## 2019-02-05 ENCOUNTER — Other Ambulatory Visit: Payer: Self-pay

## 2019-02-05 ENCOUNTER — Other Ambulatory Visit: Payer: BC Managed Care – PPO

## 2019-02-05 DIAGNOSIS — M5416 Radiculopathy, lumbar region: Secondary | ICD-10-CM | POA: Diagnosis not present

## 2019-02-05 DIAGNOSIS — O0990 Supervision of high risk pregnancy, unspecified, unspecified trimester: Secondary | ICD-10-CM

## 2019-02-05 DIAGNOSIS — M9905 Segmental and somatic dysfunction of pelvic region: Secondary | ICD-10-CM | POA: Diagnosis not present

## 2019-02-05 DIAGNOSIS — M62830 Muscle spasm of back: Secondary | ICD-10-CM | POA: Diagnosis not present

## 2019-02-05 DIAGNOSIS — M9903 Segmental and somatic dysfunction of lumbar region: Secondary | ICD-10-CM | POA: Diagnosis not present

## 2019-02-06 LAB — CBC
Hematocrit: 38.7 % (ref 34.0–46.6)
Hemoglobin: 13.3 g/dL (ref 11.1–15.9)
MCH: 32.7 pg (ref 26.6–33.0)
MCHC: 34.4 g/dL (ref 31.5–35.7)
MCV: 95 fL (ref 79–97)
Platelets: 211 10*3/uL (ref 150–450)
RBC: 4.07 x10E6/uL (ref 3.77–5.28)
RDW: 11.8 % (ref 11.7–15.4)
WBC: 10.4 10*3/uL (ref 3.4–10.8)

## 2019-02-06 LAB — HIV ANTIBODY (ROUTINE TESTING W REFLEX): HIV Screen 4th Generation wRfx: NONREACTIVE

## 2019-02-06 LAB — GLUCOSE TOLERANCE, 2 HOURS W/ 1HR
Glucose, 1 hour: 186 mg/dL — ABNORMAL HIGH (ref 65–179)
Glucose, 2 hour: 130 mg/dL (ref 65–152)
Glucose, Fasting: 80 mg/dL (ref 65–91)

## 2019-02-06 LAB — RPR: RPR Ser Ql: NONREACTIVE

## 2019-02-07 ENCOUNTER — Encounter: Payer: Self-pay | Admitting: Obstetrics and Gynecology

## 2019-02-07 DIAGNOSIS — O24419 Gestational diabetes mellitus in pregnancy, unspecified control: Secondary | ICD-10-CM | POA: Insufficient documentation

## 2019-02-09 ENCOUNTER — Telehealth: Payer: Self-pay | Admitting: *Deleted

## 2019-02-09 DIAGNOSIS — O24419 Gestational diabetes mellitus in pregnancy, unspecified control: Secondary | ICD-10-CM

## 2019-02-09 MED ORDER — ACCU-CHEK FASTCLIX LANCETS MISC: 1.0000 [IU] | Freq: Four times a day (QID) | 12 refills | Status: DC

## 2019-02-09 MED ORDER — ACCU-CHEK GUIDE W/DEVICE KIT: 1.0000 | PACK | Freq: Four times a day (QID) | 0 refills | Status: DC

## 2019-02-09 MED ORDER — ACCU-CHEK GUIDE VI STRP: ORAL_STRIP | 12 refills | Status: DC

## 2019-02-09 NOTE — Telephone Encounter (Signed)
Pt returned call, discussed with pt about checking her blood sugars and entering them into the babyscripts app. Informed about referral being placed and supplies being sent into the pharmacy and to call if she has any questions.

## 2019-02-09 NOTE — Telephone Encounter (Signed)
Left message for pt to call back. Orders also placed for diabetes supplies and referral to Diabetes and Nutrition.

## 2019-02-09 NOTE — Telephone Encounter (Signed)
-----   Message from Aletha Halim, MD sent at 02/07/2019  2:48 PM EST ----- Please set her up with supplies, classes, etc since she has GDM. thanks

## 2019-02-09 NOTE — Telephone Encounter (Signed)
-----   Message from Charlie Pickens, MD sent at 02/07/2019  2:48 PM EST ----- Please set her up with supplies, classes, etc since she has GDM. thanks 

## 2019-02-16 ENCOUNTER — Encounter: Payer: Self-pay | Admitting: Dietician

## 2019-02-16 ENCOUNTER — Encounter: Payer: BC Managed Care – PPO | Attending: Neurology | Admitting: Dietician

## 2019-02-16 ENCOUNTER — Other Ambulatory Visit: Payer: Self-pay

## 2019-02-16 VITALS — BP 128/92 | Ht 64.0 in | Wt 215.7 lb

## 2019-02-16 DIAGNOSIS — Z3A00 Weeks of gestation of pregnancy not specified: Secondary | ICD-10-CM | POA: Insufficient documentation

## 2019-02-16 DIAGNOSIS — Z713 Dietary counseling and surveillance: Secondary | ICD-10-CM | POA: Insufficient documentation

## 2019-02-16 DIAGNOSIS — O24410 Gestational diabetes mellitus in pregnancy, diet controlled: Secondary | ICD-10-CM

## 2019-02-16 DIAGNOSIS — O24419 Gestational diabetes mellitus in pregnancy, unspecified control: Secondary | ICD-10-CM | POA: Diagnosis not present

## 2019-02-16 DIAGNOSIS — O09529 Supervision of elderly multigravida, unspecified trimester: Secondary | ICD-10-CM | POA: Insufficient documentation

## 2019-02-16 NOTE — Progress Notes (Signed)
Appt. Start Time: 1340 Appt. End Time: 1510  GDM Class 1 Diabetes Overview - define DM; state own type of DM; identify functions of pancreas and insulin; define insulin deficiency vs insulin resistance  Psychosocial - identify DM as a source of stress; state the effects of stress on BG control; verbalize appropriate stress management techniques; identify personal stress issues   Nutritional Management - describe effects of food on blood glucose; identify sources of carbohydrate, protein and fat; verbalize the importance of balance meals in controlling blood glucose; identify meals as well balanced or not; estimate servings of carbohydrate from menus; use food labels to identify servings size, content of carbohydrate, fiber, protein, fat, saturated fat and sodium; recognize food sources of fat, saturated fat, trans fat, sodium and verbalize goals for intake; describe healthful appropriate food choices when dining out   Exercise - describe the effects of exercise on blood glucose and importance of regular exercise in controlling diabetes; state a plan for personal exercise; verbalize contraindications for exercise  Medications - state name, dose, timing of currently prescribed medications; describe types of medications available for diabetes  Insulin Training - prepare and administer insulin accurately; state correct rotation pattern; verbalize safe and lawful needle disposal  Self-Monitoring - state importance of HBGM and demo procedure accurately; use HBGM results to effectively manage diabetes; identify importance of regular HbA1C testing and goals for results  Acute Complications/Sick Day Guidelines - recognize hyperglycemia and hypoglycemia with causes and effects; identify blood glucose results as high, low or in control; list steps in treating and preventing high and low blood glucose; state appropriate measure to manage blood glucose when ill (need for meds, HBGM plan, when to call physician,  need for fluids)  Chronic Complications/Foot, Skin, Eye Dental Care - identify possible long-term complications of diabetes (retinopathy, neuropathy, nephropathy, cardiovascular disease, infections); explain steps in prevention and treatment of chronic complications; state importance of daily self-foot exams; describe how to examine feet and what to look for; explain appropriate eye and dental care  Lifestyle Changes/Goals & Health/Community Resources - state benefits of making appropriate lifestyle changes; identify habits that need to change (meals, tobacco, alcohol); identify strategies to reduce risk factors for personal health; set goals for proper diabetes care; state need for and frequency of healthcare follow-up; describe appropriate community resources for good health (ADA, web sites, apps)   Pregnancy/Sexual Health - define gestational diabetes; state importance of good blood glucose control and birth control prior to pregnancy; state importance of good blood glucose control in preventing sexual problems (impotence, vaginal dryness, infections, loss of desire); state relationship of blood glucose control and pregnancy outcome; describe risk of maternal and fetal complications  Teaching Materials Used:  General Meal Planning Guidelines Daily Food Record Gestational Diabetes Booklet Gestational Video Goals for Healthy Pregnancy

## 2019-02-17 DIAGNOSIS — F411 Generalized anxiety disorder: Secondary | ICD-10-CM | POA: Diagnosis not present

## 2019-02-19 ENCOUNTER — Ambulatory Visit (HOSPITAL_COMMUNITY): Payer: BC Managed Care – PPO

## 2019-02-20 NOTE — L&D Delivery Note (Addendum)
OB/GYN Faculty Practice Delivery Note  [name] is a 44 y.o. [id number] s/p SVD at [redacted]w[redacted]d. She was admitted for IOL for gDM.   ROM: 3h 18m with clear fluid GBS Status: --/Positive (02/11 1555)  Labor Progress: Patient presented to L&D for IOL for gDM. Initial SVE: 1/thick/-3. Her induction was started with cytotec and FB. She received a total of 3 cytotec, and was AROMed. She progressed to complete without further intervention, and delivered shortly after.   Delivery Date/Time: 04/24/2019; 2230 Delivery: Called to room and patient was complete and pushing. Head delivered straight OA. No nuchal cord present. Shoulder and body delivered in usual fashion. Infant with spontaneous cry, placed on mother's abdomen, dried and stimulated. Cord clamped x 2 after 1-minute delay, and cut by father of baby under my direct supervision. Cord blood drawn. Placenta delivered spontaneously with gentle cord traction. Fundus firm with massage and Pitocin. Labia, perineum, vagina, and cervix inspected inspected and a small perineal abrasion was noted, hemostatic, not repaired.   Baby Weight: Per medical record  Placenta: Sent to L&D Complications: None Lacerations: Small perineal abrasion present, hemostatic, not repaired EBL: 67mL Analgesia: Epidural   Infant: Female APGAR (1 MIN): 9   APGAR (5 MINS): [address], DO 04/24/2019, 10:51 PM   GME ATTESTATION:  I saw and evaluated the patient. I was gloved and supervised the resident throughout the entire delivery. I agree with the findings and the plan of care as documented in the resident's note.  Marlowe Alt, DO OB Fellow, Faculty [facility], [facility] 04/24/2019 11:12 PM

## 2019-02-24 DIAGNOSIS — M62830 Muscle spasm of back: Secondary | ICD-10-CM | POA: Diagnosis not present

## 2019-02-24 DIAGNOSIS — M9905 Segmental and somatic dysfunction of pelvic region: Secondary | ICD-10-CM | POA: Diagnosis not present

## 2019-02-24 DIAGNOSIS — M5416 Radiculopathy, lumbar region: Secondary | ICD-10-CM | POA: Diagnosis not present

## 2019-02-24 DIAGNOSIS — M9903 Segmental and somatic dysfunction of lumbar region: Secondary | ICD-10-CM | POA: Diagnosis not present

## 2019-02-26 ENCOUNTER — Encounter: Payer: BC Managed Care – PPO | Attending: Neurology | Admitting: Skilled Nursing Facility1

## 2019-02-26 ENCOUNTER — Encounter: Payer: BC Managed Care – PPO | Admitting: Obstetrics and Gynecology

## 2019-02-26 ENCOUNTER — Encounter: Payer: Self-pay | Admitting: *Deleted

## 2019-02-26 ENCOUNTER — Other Ambulatory Visit: Payer: Self-pay

## 2019-02-26 ENCOUNTER — Ambulatory Visit (INDEPENDENT_AMBULATORY_CARE_PROVIDER_SITE_OTHER): Payer: BC Managed Care – PPO | Admitting: Obstetrics and Gynecology

## 2019-02-26 VITALS — BP 131/81 | HR 97 | Wt 216.0 lb

## 2019-02-26 DIAGNOSIS — O09529 Supervision of elderly multigravida, unspecified trimester: Secondary | ICD-10-CM | POA: Diagnosis not present

## 2019-02-26 DIAGNOSIS — Z23 Encounter for immunization: Secondary | ICD-10-CM

## 2019-02-26 DIAGNOSIS — O0993 Supervision of high risk pregnancy, unspecified, third trimester: Secondary | ICD-10-CM

## 2019-02-26 DIAGNOSIS — Z713 Dietary counseling and surveillance: Secondary | ICD-10-CM | POA: Insufficient documentation

## 2019-02-26 DIAGNOSIS — O0990 Supervision of high risk pregnancy, unspecified, unspecified trimester: Secondary | ICD-10-CM

## 2019-02-26 DIAGNOSIS — O24419 Gestational diabetes mellitus in pregnancy, unspecified control: Secondary | ICD-10-CM | POA: Insufficient documentation

## 2019-02-26 DIAGNOSIS — O99210 Obesity complicating pregnancy, unspecified trimester: Secondary | ICD-10-CM

## 2019-02-26 DIAGNOSIS — E669 Obesity, unspecified: Secondary | ICD-10-CM | POA: Insufficient documentation

## 2019-02-26 DIAGNOSIS — Z8619 Personal history of other infectious and parasitic diseases: Secondary | ICD-10-CM | POA: Insufficient documentation

## 2019-02-26 DIAGNOSIS — Z3A00 Weeks of gestation of pregnancy not specified: Secondary | ICD-10-CM | POA: Diagnosis not present

## 2019-02-26 DIAGNOSIS — O24410 Gestational diabetes mellitus in pregnancy, diet controlled: Secondary | ICD-10-CM

## 2019-02-26 DIAGNOSIS — Z3A30 30 weeks gestation of pregnancy: Secondary | ICD-10-CM

## 2019-02-26 DIAGNOSIS — O09523 Supervision of elderly multigravida, third trimester: Secondary | ICD-10-CM

## 2019-02-26 DIAGNOSIS — O99213 Obesity complicating pregnancy, third trimester: Secondary | ICD-10-CM

## 2019-02-26 MED ORDER — BREAST PUMP MISC: 0 refills | Status: DC

## 2019-02-26 MED ORDER — METFORMIN HCL 500 MG PO TABS: 500.0000 mg | ORAL_TABLET | Freq: Every day | ORAL | 1 refills | Status: DC

## 2019-02-26 NOTE — Progress Notes (Signed)
Prenatal Visit Note Date: 02/26/2019 Clinic: [facility]  Subjective:  Selda Jalbert is a 44 y.o. [phone] at [redacted]w[redacted]d being seen today for ongoing prenatal care.  She is currently monitored for the following issues for this high-risk pregnancy and has Depression, recurrent (HCC); MDD (major depressive disorder); Supervision of high risk pregnancy, antepartum; AMA (advanced maternal age) multigravida 22+; Obesity in pregnancy; GDM (gestational diabetes mellitus); BMI 30s; and ? History of herpes genitalis on their problem list.  Patient reports no complaints.   Contractions: Not present. Vag. Bleeding: None.  Movement: Present. Denies leaking of fluid.   The following portions of the patient's history were reviewed and updated as appropriate: allergies, current medications, past family history, past medical history, past social history, past surgical history and problem list. Problem list updated.  Objective:   Vitals:   02/26/19 1019  BP: 131/81  Pulse: 97  Weight: 216 lb (98 kg)    Fetal Status: Fetal Heart Rate (bpm): 150   Movement: Present     General:  Alert, oriented and cooperative. Patient is in no acute distress.  Skin: Skin is warm and dry. No rash noted.   Cardiovascular: Normal heart rate noted  Respiratory: Normal respiratory effort, no problems with respiration noted  Abdomen: Soft, gravid, appropriate for gestational age. Pain/Pressure: Present     Pelvic:  Cervical exam deferred        Extremities: Normal range of motion.  Edema: Trace  Mental Status: Normal mood and affect. Normal behavior. Normal judgment and thought content.   Urinalysis:      Assessment and Plan:  Pregnancy: G4P1021 at [redacted]w[redacted]d  1. Supervision of high risk pregnancy, antepartum Routine care. OCPs - [place] MFM FETAL BPP WO NON STRESS; Future - CMP  2. Multigravida of advanced maternal age in third trimester Start ap testing next week Delivery around 39wks  3. Obesity in  pregnancy  4. Obesity (BMI 30-39.9)  5. Gestational diabetes mellitus (GDM) in third trimester, gestational diabetes method of control unspecified See babyscripts log book. Recommend starting metformin 500 with breakfast. To start ap testing next week. Has growth scan next week too - CMP  6. ? History of herpes genitalis D/w her re: valtrex starting at 34-36wks  Preterm labor symptoms and general obstetric precautions including but not limited to vaginal bleeding, contractions, leaking of fluid and fetal movement were reviewed in detail with the patient. Please refer to After Visit Summary for other counseling recommendations.  Return in about 1 week (around 03/05/2019) for high risk, virtual visit. 3wk virtual visit. 5wk in person visit.   Commack Bing, MD

## 2019-02-26 NOTE — Progress Notes (Signed)
Pt states she is 30 weeks 6 days a long. Pt states her physician is going to start her on metformin. Pt states she has her numbers through baby scripts. Fasting: 92, 89, 96, 94, 89, 95, 94, 90 2 hours after: 137, 133, 145, 138, 136, 111, 120, 119 Pt states she has not been measuring her foods stating it is too much to have to do. Dietitian educated pt on not needing to measure her foods all the time but rather here and there to get a better idea of what sizes actually look like to better count carbohydrates.  137: 01/01   After dinner most often higher numbers: pt states she does typically have higher carbohydrate meals for dinner.  Pt states since starting back to work she has missed some checking of her blood sugars. Pt states she cannot have snacks now that she is back at work. Pt states se has noticed when she started back to work and about 5 hours after not eating she will get a little shaky.  Pt states she works from home and states her job is stressful but feels it is well managed.  Pt states she usually goes for a walk at about 2pm for about 1 hour typically 5 days a week.   Goals:  Start measuring here and there to better know what 1/4 cup or 1/2 cup looks like to better count your carbohydrates  Try a smoothie in between for snacks or premier oats pre made protein drink [place] job walking! 1-2 carb complex carb choices for breakfast and 2-3 complex carb choices for lunch and dinner

## 2019-02-27 LAB — COMPREHENSIVE METABOLIC PANEL
ALT: 16 IU/L (ref 0–32)
AST: 11 IU/L (ref 0–40)
Albumin/Globulin Ratio: 1.3 (ref 1.2–2.2)
Albumin: 3.4 g/dL — ABNORMAL LOW (ref 3.8–4.8)
Alkaline Phosphatase: 97 IU/L (ref 39–117)
BUN/Creatinine Ratio: 11 (ref 9–23)
BUN: 6 mg/dL (ref 6–24)
Bilirubin Total: 0.3 mg/dL (ref 0.0–1.2)
CO2: 18 mmol/L — ABNORMAL LOW (ref 20–29)
Calcium: 9.1 mg/dL (ref 8.7–10.2)
Chloride: 105 mmol/L (ref 96–106)
Creatinine, Ser: 0.53 mg/dL — ABNORMAL LOW (ref 0.57–1.00)
GFR calc Af Amer: 135 mL/min/{1.73_m2} (ref >59)
GFR calc non Af Amer: 117 mL/min/{1.73_m2} (ref >59)
Globulin, Total: 2.6 g/dL (ref 1.5–4.5)
Glucose: 123 mg/dL — ABNORMAL HIGH (ref 65–99)
Potassium: 3.9 mmol/L (ref 3.5–5.2)
Sodium: 138 mmol/L (ref 134–144)
Total Protein: 6 g/dL (ref 6.0–8.5)

## 2019-03-05 ENCOUNTER — Other Ambulatory Visit (HOSPITAL_COMMUNITY): Payer: Self-pay | Admitting: *Deleted

## 2019-03-05 ENCOUNTER — Other Ambulatory Visit: Payer: Self-pay

## 2019-03-05 ENCOUNTER — Encounter: Payer: Self-pay | Admitting: *Deleted

## 2019-03-05 ENCOUNTER — Ambulatory Visit (HOSPITAL_COMMUNITY)
Admission: RE | Admit: 2019-03-05 | Discharge: 2019-03-05 | Disposition: A | Discharge status: Home / Self Care | Payer: BC Managed Care – PPO | Source: Ambulatory Visit | Attending: Obstetrics and Gynecology | Admitting: Obstetrics and Gynecology

## 2019-03-05 ENCOUNTER — Ambulatory Visit (HOSPITAL_COMMUNITY): Payer: BC Managed Care – PPO | Admitting: *Deleted

## 2019-03-05 ENCOUNTER — Encounter (HOSPITAL_COMMUNITY): Payer: Self-pay

## 2019-03-05 ENCOUNTER — Telehealth (INDEPENDENT_AMBULATORY_CARE_PROVIDER_SITE_OTHER): Payer: BC Managed Care – PPO | Admitting: Obstetrics & Gynecology

## 2019-03-05 VITALS — BP 117/81 | HR 91 | Temp 97.5°F | Wt 217.6 lb

## 2019-03-05 DIAGNOSIS — O09523 Supervision of elderly multigravida, third trimester: Secondary | ICD-10-CM | POA: Insufficient documentation

## 2019-03-05 DIAGNOSIS — O99213 Obesity complicating pregnancy, third trimester: Secondary | ICD-10-CM

## 2019-03-05 DIAGNOSIS — O09529 Supervision of elderly multigravida, unspecified trimester: Secondary | ICD-10-CM

## 2019-03-05 DIAGNOSIS — O24415 Gestational diabetes mellitus in pregnancy, controlled by oral hypoglycemic drugs: Secondary | ICD-10-CM

## 2019-03-05 DIAGNOSIS — E669 Obesity, unspecified: Secondary | ICD-10-CM

## 2019-03-05 DIAGNOSIS — Z362 Encounter for other antenatal screening follow-up: Secondary | ICD-10-CM | POA: Diagnosis not present

## 2019-03-05 DIAGNOSIS — Z3A31 31 weeks gestation of pregnancy: Secondary | ICD-10-CM

## 2019-03-05 DIAGNOSIS — O0993 Supervision of high risk pregnancy, unspecified, third trimester: Secondary | ICD-10-CM

## 2019-03-05 DIAGNOSIS — O0990 Supervision of high risk pregnancy, unspecified, unspecified trimester: Secondary | ICD-10-CM | POA: Diagnosis not present

## 2019-03-05 DIAGNOSIS — Z8619 Personal history of other infectious and parasitic diseases: Secondary | ICD-10-CM

## 2019-03-05 IMAGING — US US MFM OB FOLLOW-UP
1 series · 13 of 28 positions shown · non-contrast
Comparison: none

[Series 1: us mfm ob follow-up · 13 of 30 slices shown]
[im 2/30]
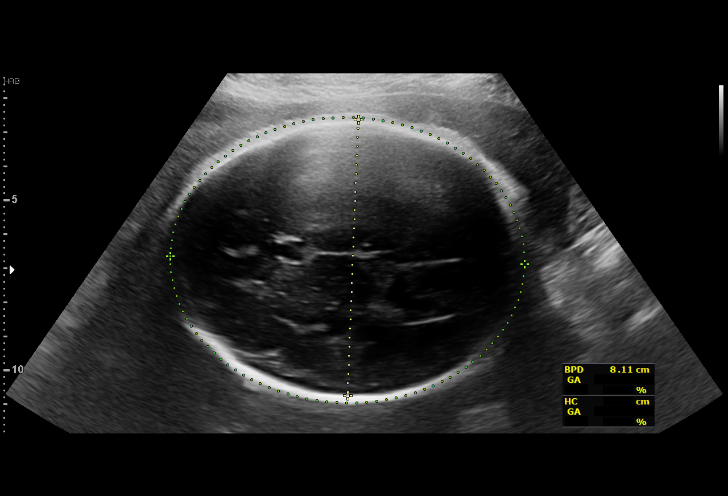
[im 4/30]
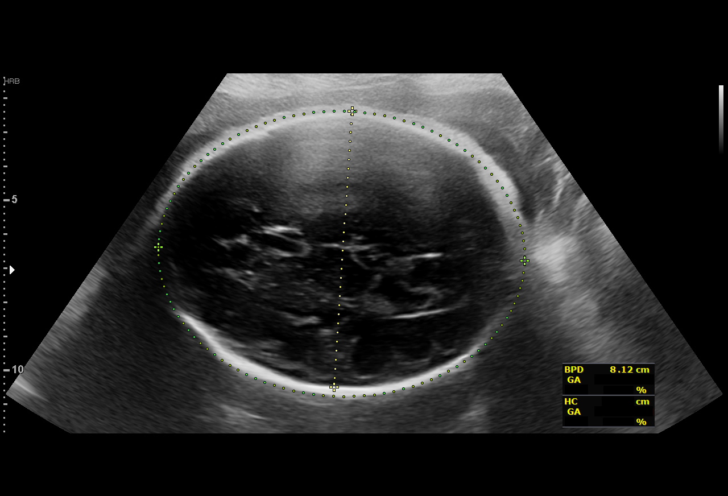
[im 6/30]
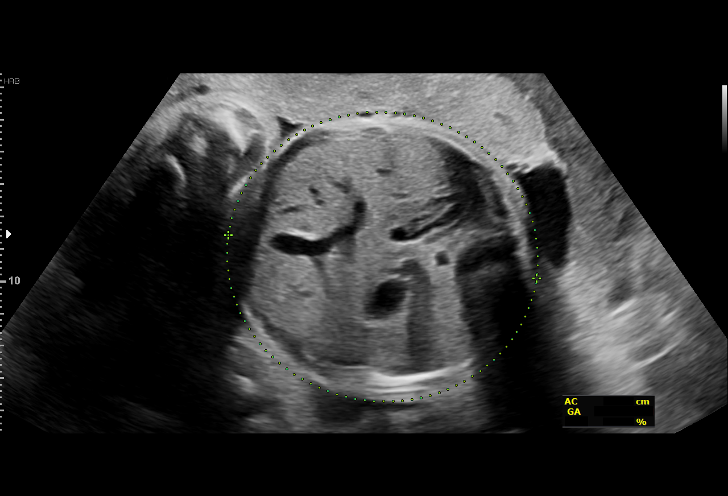
[im 8/30]
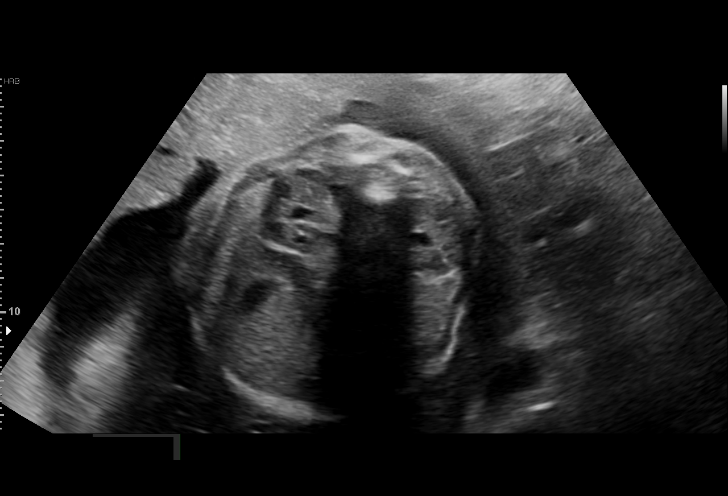
[im 10/30]
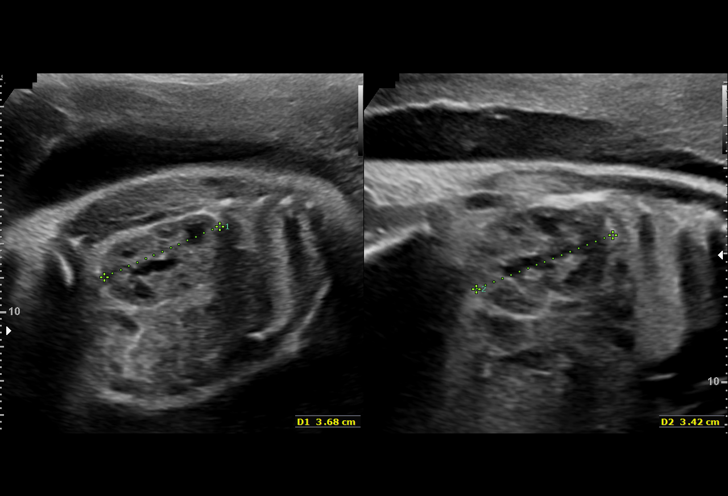
[im 12/30]
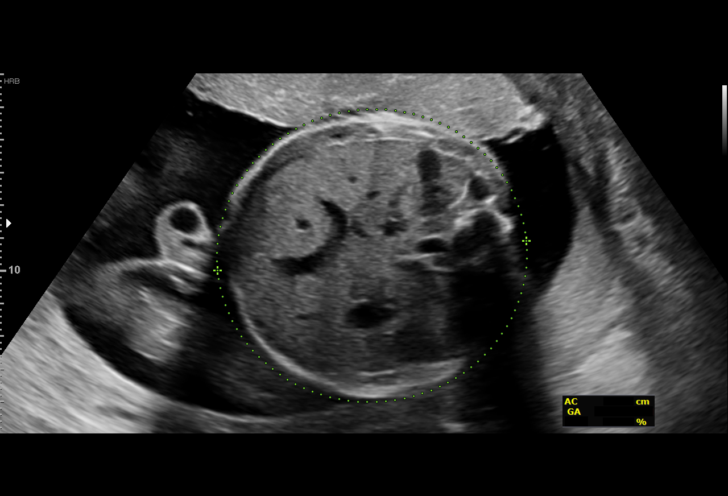
[im 16/30]
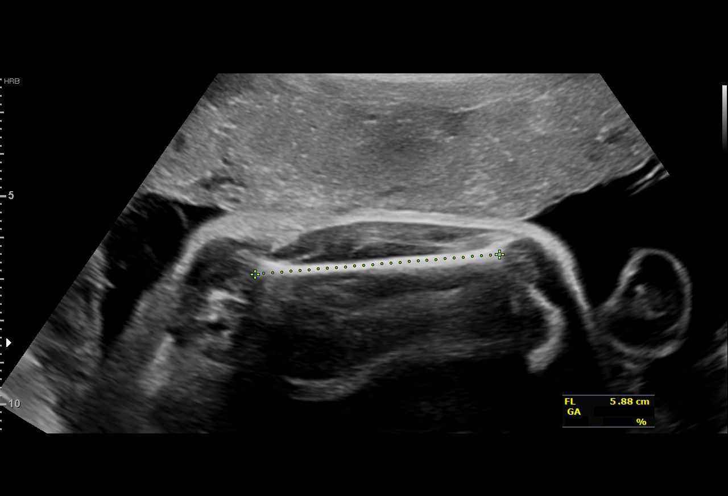
[im 18/30]
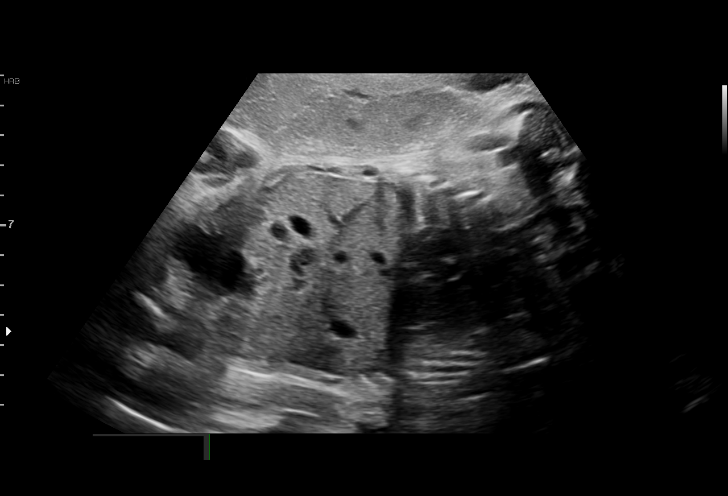
[im 20/30]
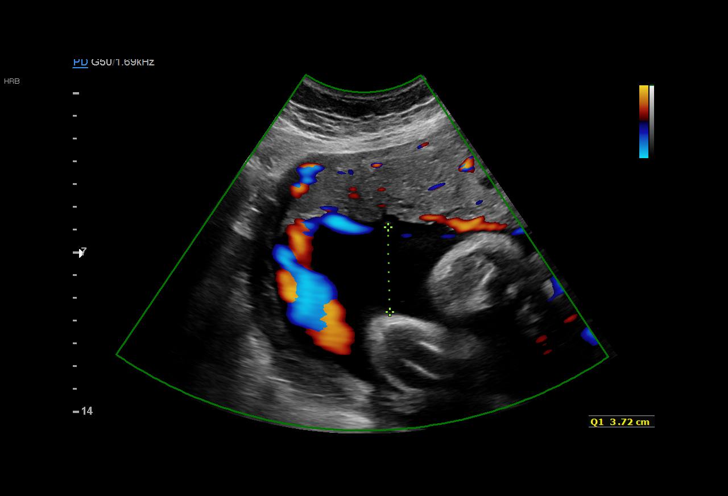
[im 22/30]
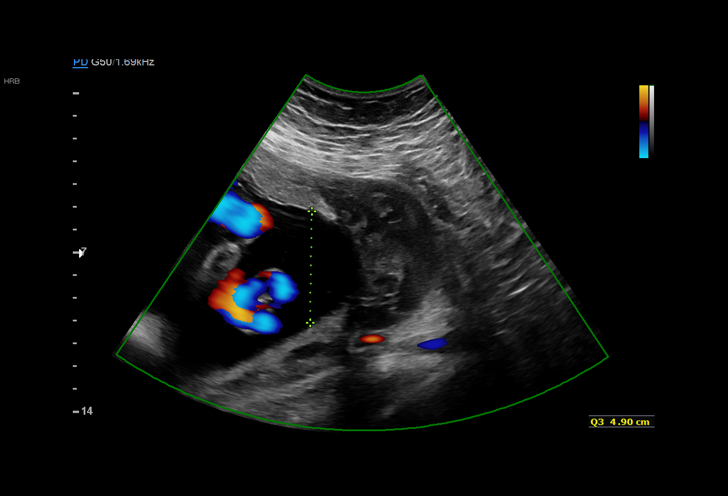
[im 24/30]
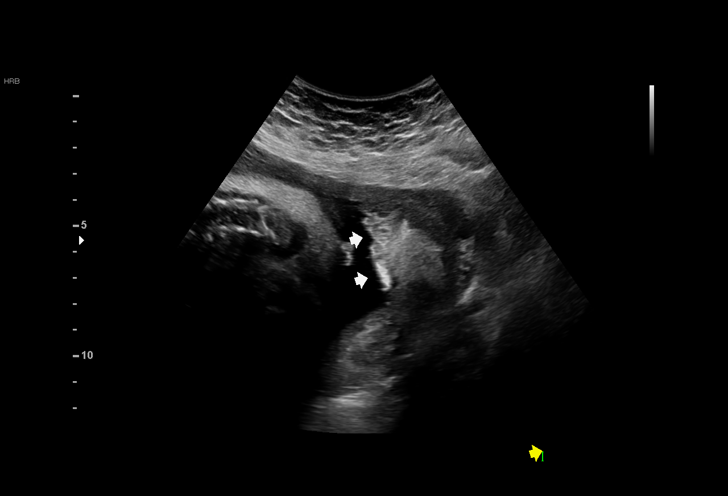
[im 26/30]
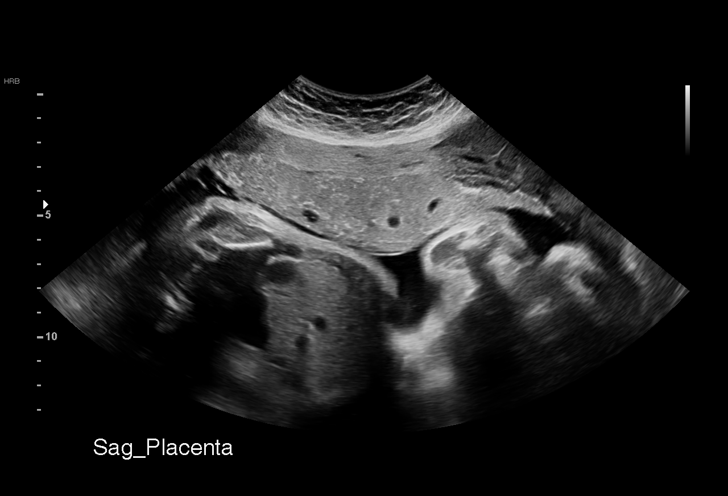
[im 28/30]
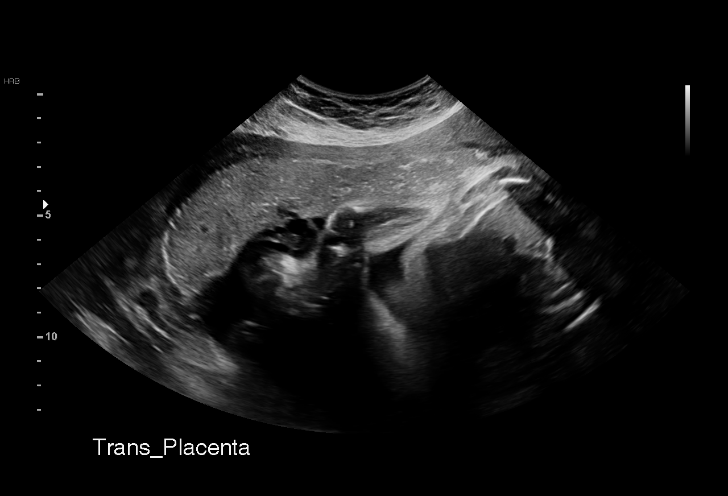

[13 of 28 positions shown; findings below may reference images not displayed]

----------------------------------------------------------------------

 ----------------------------------------------------------------------
Indications

  Advanced maternal age multigravida 35+,        [4O]
  second trimester (43 yrs)
  Obesity complicating pregnancy                 [4O] [4O]
  (PREGRAVIDA BMI 33)
  Low Risk NIPS
  Encounter for other antenatal screening        [4O]
  follow-up
  31 weeks gestation of pregnancy
 ----------------------------------------------------------------------
Vital Signs

 BMI:
Fetal Evaluation

 Num Of Fetuses:         1
 Cardiac Activity:       Observed
 Presentation:           Cephalic
 Placenta:               Anterior
 P. Cord Insertion:      Previously Visualized

 Amniotic Fluid
 AFI FV:      Within normal limits

 AFI Sum(cm)     %Tile       Largest Pocket(cm)
 16.64           60

 RUQ(cm)       RLQ(cm)       LUQ(cm)        LLQ(cm)

Biophysical Evaluation

 Amniotic F.V:   Within normal limits       F. Tone:        Observed
 F. Movement:    Observed                   Score:          [DATE]
 F. Breathing:   Observed
Biometry

 BPD:      81.2  mm     G. Age:  32w 4d         68  %    CI:        74.94   %    70 - 86
                                                         FL/HC:      19.9   %    19.1 -
 HC:      297.6  mm     G. Age:  33w 0d         46  %    HC/AC:      1.03        0.96 -
 AC:       290   mm     G. Age:  33w 0d         83  %    FL/BPD:     72.9   %    71 - 87
 FL:       59.2  mm     G. Age:  30w 6d         17  %    FL/AC:      20.4   %    20 - 24

 Est. FW:    [4O]  gm      4 lb 5 oz     60  %
OB History

 Gravidity:    4         Term:   1         SAB:   2
 Living:       1
Gestational Age

 LMP:           31w 6d        Date:  [DATE]                 EDD:   [DATE]
 U/S Today:     32w 3d                                        EDD:   [DATE]
 Best:          31w 5d     Det. By:  Previous Ultrasound      EDD:   [DATE]
                                     ([DATE])
Anatomy

 Cranium:               Appears normal         Aortic Arch:            Previously seen
 Cavum:                 Appears normal         Ductal Arch:            Not well visualized
 Ventricles:            Appears normal         Diaphragm:              Appears normal
 Choroid Plexus:        Previously seen        Stomach:                Appears normal, left
                                                                       sided
 Cerebellum:            Previously seen        Abdomen:                Appears normal
 Posterior Fossa:       Previously seen        Abdominal Wall:         Previously seen
 Nuchal Fold:           Previously seen        Cord Vessels:           Previously seen
 Face:                  Orbits and profile     Kidneys:                Appear normal
                        previously seen
 Lips:                  Previously seen        Bladder:                Appears normal
 Thoracic:              Appears normal         Spine:                  Previously seen
 Heart:                 Previously seen        Upper Extremities:      Previously seen
 RVOT:                  Previously seen        Lower Extremities:      Previously seen
 LVOT:                  Previously seen

 Other:  Technically difficult due to maternal habitus and fetal position.
Cervix Uterus Adnexa

 Cervix
 Not visualized (advanced GA >[4O])
Comments

 This patient was seen for a follow up growth scan due to
 maternal obesity and advanced maternal age.  She reports
 that she was recently diagnosed with A2 gestational diabetes.
 She is currently treated with Metformin.
 She was informed that the fetal growth and amniotic fluid
 level appears appropriate for her gestational age.
 A biophysical profile performed today was [DATE].
 As she is over the age of 40 and due to A2 gestational
 diabetes, we will continue to follow her with weekly fetal
 testing.
 A follow-up exam was scheduled in 1 week.

## 2019-03-05 MED ORDER — VALACYCLOVIR HCL 1 G PO TABS: 1000.0000 mg | ORAL_TABLET | Freq: Every day | ORAL | 3 refills | Status: DC

## 2019-03-05 NOTE — Progress Notes (Signed)
TELEHEALTH OBSTETRICS PRENATAL VIRTUAL VIDEO VISIT ENCOUNTER NOTE  Provider location: [facility]   I connected with [name] on 03/05/19 at  2:45 PM EST by MyChart Video Encounter at home and verified that I am speaking with the correct person using two identifiers.   I discussed the limitations, risks, security and privacy concerns of performing an evaluation and management service virtually and the availability of in person appointments. I also discussed with the patient that there may be a patient responsible charge related to this service. The patient expressed understanding and agreed to proceed. Subjective:  [name] is a 44 y.o. [id number] at [redacted]w[redacted]d being seen today for ongoing prenatal care.  She is currently monitored for the following issues for this high-risk pregnancy and has Depression, recurrent (HCC); MDD (major depressive disorder); Supervision of high risk pregnancy, antepartum; AMA (advanced maternal age) multigravida 88+; Obesity in pregnancy; GDM (gestational diabetes mellitus); BMI 30s; and ? History of herpes genitalis on their problem list.  Patient reports no complaints.  Contractions: Not present. Vag. Bleeding: None.  Movement: Present. Denies any leaking of fluid.   The following portions of the patient's history were reviewed and updated as appropriate: allergies, current medications, past family history, past medical history, past social history, past surgical history and problem list.   Objective:  There were no vitals filed for this visit.  Fetal Status:     Movement: Present     General:  Alert, oriented and cooperative. Patient is in no acute distress.  Respiratory: Normal respiratory effort, no problems with respiration noted  Mental Status: Normal mood and affect. Normal behavior. Normal judgment and thought content.  Rest of physical exam deferred due to type of encounter  Imaging: [place] MFM FETAL BPP  WO NON STRESS  Result Date: 03/05/2019 ----------------------------------------------------------------------  OBSTETRICS REPORT                       (Signed Final 03/05/2019 01:43 pm) ---------------------------------------------------------------------- Patient Info  ID #:       [id number]                          D.O.B.:  [birth year] (43 yrs)  Name:       [name]            Visit Date: 03/05/2019 12:56 pm ---------------------------------------------------------------------- Performed By  Performed By:     Lenise Arena        Ref. Address:     [address]                    [address]                                                             Rd  Attending:        Ma Rings MD         [facility]  Referred By:      Federico Flake MD ---------------------------------------------------------------------- Orders   #  Description                          Code         Ordered By   1  [place] MFM OB FOLLOW UP                  76816.01     RAVI SHANKAR   2  [place] MFM FETAL BPP WO NON              76819.01     CHARLIE PICKENS      STRESS  ----------------------------------------------------------------------   #  Order #                    Accession #                 Episode #   1  [id number]                  [id number]                  [id number]   2  [id number]                  [id number]                  [id number]  ---------------------------------------------------------------------- Indications   Advanced maternal age multigravida 64+,        O51.522   second trimester (43 yrs)   Obesity complicating pregnancy                 O99.210 E66.9   (PREGRAVIDA BMI 33)   Low Risk NIPS   Encounter for other antenatal screening        Z36.2   follow-up   [redacted] weeks gestation of pregnancy                Z3A.31  ---------------------------------------------------------------------- Vital Signs  Weight  (lb): 216                               Height:        5'4"  BMI:         37.07 ---------------------------------------------------------------------- Fetal Evaluation  Num Of Fetuses:         1  Cardiac Activity:       Observed  Presentation:           Cephalic  Placenta:               Anterior  P. Cord Insertion:      Previously Visualized  Amniotic Fluid  AFI FV:      Within normal limits  AFI Sum(cm)     %Tile       Largest Pocket(cm)  16.64           60          4.9  RUQ(cm)       RLQ(cm)       LUQ(cm)        LLQ(cm)  3.72          3.82          4.2  4.9 ---------------------------------------------------------------------- Biophysical Evaluation  Amniotic F.V:   Within normal limits       F. Tone:        Observed  F. Movement:    Observed                   Score:          8/8  F. Breathing:   Observed ---------------------------------------------------------------------- Biometry  BPD:      81.2  mm     G. Age:  32w 4d         68  %    CI:        74.94   %    70 - 86                                                          FL/HC:      19.9   %    19.1 - 21.3  HC:      297.6  mm     G. Age:  33w 0d         46  %    HC/AC:      1.03        0.96 - 1.17  AC:       290   mm     G. Age:  33w 0d         83  %    FL/BPD:     72.9   %    71 - 87  FL:       59.2  mm     G. Age:  30w 6d         17  %    FL/AC:      20.4   %    20 - 24  Est. FW:    1953  gm      4 lb 5 oz     60  % ---------------------------------------------------------------------- OB History  Gravidity:    4         Term:   1         SAB:   2  Living:       1 ---------------------------------------------------------------------- Gestational Age  LMP:           31w 6d        Date:  07/25/18                 EDD:   05/01/19  U/S Today:     32w 3d                                        EDD:   04/27/19  Best:          31w 5d     Det. By:  Previous Ultrasound      EDD:   05/02/19                                      (09/18/18)  ---------------------------------------------------------------------- Anatomy  Cranium:  Appears normal         Aortic Arch:            Previously seen  Cavum:                 Appears normal         Ductal Arch:            Not well visualized  Ventricles:            Appears normal         Diaphragm:              Appears normal  Choroid Plexus:        Previously seen        Stomach:                Appears normal, left                                                                        sided  Cerebellum:            Previously seen        Abdomen:                Appears normal  Posterior Fossa:       Previously seen        Abdominal Wall:         Previously seen  Nuchal Fold:           Previously seen        Cord Vessels:           Previously seen  Face:                  Orbits and profile     Kidneys:                Appear normal                         previously seen  Lips:                  Previously seen        Bladder:                Appears normal  Thoracic:              Appears normal         Spine:                  Previously seen  Heart:                 Previously seen        Upper Extremities:      Previously seen  RVOT:                  Previously seen        Lower Extremities:      Previously seen  LVOT:                  Previously seen  Other:  Technically difficult due to maternal habitus and fetal position. ---------------------------------------------------------------------- Cervix Uterus Adnexa  Cervix  Not visualized (  advanced GA >24wks) ---------------------------------------------------------------------- Comments  This patient was seen for a follow up growth scan due to  maternal obesity and advanced maternal age.  She reports  that she was recently diagnosed with A2 gestational diabetes.  She is currently treated with Metformin.  She was informed that the fetal growth and amniotic fluid  level appears appropriate for her gestational age.  A biophysical profile performed today  was 8 out of 8.  As she is over the age of 77 and due to A2 gestational  diabetes, we will continue to follow her with weekly fetal  testing.  A follow-up exam was scheduled in 1 week. ----------------------------------------------------------------------                   Ma Rings, MD Electronically Signed Final Report   03/05/2019 01:43 pm ----------------------------------------------------------------------  [place] MFM OB FOLLOW UP  Result Date: 03/05/2019 ----------------------------------------------------------------------  OBSTETRICS REPORT                       (Signed Final 03/05/2019 01:43 pm) ---------------------------------------------------------------------- Patient Info  ID #:       [id number]                          D.O.B.:  [birth year] (43 yrs)  Name:       [name]            Visit Date: 03/05/2019 12:56 pm ---------------------------------------------------------------------- Performed By  Performed By:     Lenise Arena        Ref. Address:     [address]                    RD[address]                                                             Rd  Attending:        Ma Rings MD         [facility]  Referred By:      Federico Flake MD ---------------------------------------------------------------------- Orders   #  Description                          Code         Ordered By   1  [place] MFM OB FOLLOW UP                  08144.81     RAVI SHANKAR   2  [place] MFM FETAL BPP WO NON              76819.01     CHARLIE PICKENS  STRESS  ----------------------------------------------------------------------   #  Order #                    Accession #                 Episode #   1  [id number]                  [id number]                  [id number]   2  [id number]                  [id number]                  [id number]   ---------------------------------------------------------------------- Indications   Advanced maternal age multigravida 51+,        O50.522   second trimester (82 yrs)   Obesity complicating pregnancy                 O99.210 E66.9   (PREGRAVIDA BMI 33)   Low Risk NIPS   Encounter for other antenatal screening        Z36.2   follow-up   [redacted] weeks gestation of pregnancy                Z3A.31  ---------------------------------------------------------------------- Vital Signs  Weight (lb): 216                               Height:        5'4"  BMI:         37.07 ---------------------------------------------------------------------- Fetal Evaluation  Num Of Fetuses:         1  Cardiac Activity:       Observed  Presentation:           Cephalic  Placenta:               Anterior  P. Cord Insertion:      Previously Visualized  Amniotic Fluid  AFI FV:      Within normal limits  AFI Sum(cm)     %Tile       Largest Pocket(cm)  16.64           60          4.9  RUQ(cm)       RLQ(cm)       LUQ(cm)        LLQ(cm)  3.72          3.82          4.2            4.9 ---------------------------------------------------------------------- Biophysical Evaluation  Amniotic F.V:   Within normal limits       F. Tone:        Observed  F. Movement:    Observed                   Score:          8/8  F. Breathing:   Observed ---------------------------------------------------------------------- Biometry  BPD:      81.2  mm     G. Age:  32w 4d         68  %    CI:        74.94   %    70 - 86  FL/HC:      19.9   %    19.1 - 21.3  HC:      297.6  mm     G. Age:  33w 0d         46  %    HC/AC:      1.03        0.96 - 1.17  AC:       290   mm     G. Age:  33w 0d         83  %    FL/BPD:     72.9   %    71 - 87  FL:       59.2  mm     G. Age:  30w 6d         17  %    FL/AC:      20.4   %    20 - 24  Est. FW:    1953  gm      4 lb 5 oz     60  %  ---------------------------------------------------------------------- OB History  Gravidity:    4         Term:   1         SAB:   2  Living:       1 ---------------------------------------------------------------------- Gestational Age  LMP:           31w 6d        Date:  07/25/18                 EDD:   05/01/19  U/S Today:     32w 3d                                        EDD:   04/27/19  Best:          31w 5d     Det. By:  Previous Ultrasound      EDD:   05/02/19                                      (09/18/18) ---------------------------------------------------------------------- Anatomy  Cranium:               Appears normal         Aortic Arch:            Previously seen  Cavum:                 Appears normal         Ductal Arch:            Not well visualized  Ventricles:            Appears normal         Diaphragm:              Appears normal  Choroid Plexus:        Previously seen        Stomach:                Appears normal, left  sided  Cerebellum:            Previously seen        Abdomen:                Appears normal  Posterior Fossa:       Previously seen        Abdominal Wall:         Previously seen  Nuchal Fold:           Previously seen        Cord Vessels:           Previously seen  Face:                  Orbits and profile     Kidneys:                Appear normal                         previously seen  Lips:                  Previously seen        Bladder:                Appears normal  Thoracic:              Appears normal         Spine:                  Previously seen  Heart:                 Previously seen        Upper Extremities:      Previously seen  RVOT:                  Previously seen        Lower Extremities:      Previously seen  LVOT:                  Previously seen  Other:  Technically difficult due to maternal habitus and fetal position. ----------------------------------------------------------------------  Cervix Uterus Adnexa  Cervix  Not visualized (advanced GA >24wks) ---------------------------------------------------------------------- Comments  This patient was seen for a follow up growth scan due to  maternal obesity and advanced maternal age.  She reports  that she was recently diagnosed with A2 gestational diabetes.  She is currently treated with Metformin.  She was informed that the fetal growth and amniotic fluid  level appears appropriate for her gestational age.  A biophysical profile performed today was 8 out of 8.  As she is over the age of 44 and due to A2 gestational  diabetes, we will continue to follow her with weekly fetal  testing.  A follow-up exam was scheduled in 1 week. ----------------------------------------------------------------------                   Ma RingsVictor Fang, MD Electronically Signed Final Report   03/05/2019 01:43 pm ----------------------------------------------------------------------   Assessment and Plan:  Pregnancy: [id number] at 5377w6d 1. ? History of herpes genitalis Valtrex suppression prescribed.  2. Gestational diabetes mellitus (GDM) in third trimester controlled on oral hypoglycemic drug CBGs reviewed, continue Metformin.  Continue antenatal testing as per MFM.  3. Supervision of high risk pregnancy, antepartum Desires BT Preterm labor symptoms and general obstetric precautions including but not limited to vaginal bleeding, contractions, leaking of fluid and  fetal movement were reviewed in detail with the patient. I discussed the assessment and treatment plan with the patient. The patient was provided an opportunity to ask questions and all were answered. The patient agreed with the plan and demonstrated an understanding of the instructions. The patient was advised to call back or seek an in-person office evaluation/go to MAU at [facility] for any urgent or concerning symptoms. Please refer to After Visit Summary for other counseling  recommendations.   I provided 15 minutes of face-to-face time during this encounter.  Return in about 2 weeks (around 03/19/2019) for Virtual OB Visit.  Future Appointments  Date Time Provider [place]  03/11/2019 10:45 AM Miles [facility]ST [place] [facility]  03/19/2019  4:00 PM Donnamae Jude, MD [facility]  03/20/2019  1:15 PM [place] 4 WH-MFCUS [facility]-US  03/26/2019 11:00 AM [facility]-MFC [place] 3 WH-MFCUS M[facility]-US  04/02/2019  9:30 AM [facility] [place] 1 WH-MFCUS M[facility]-US  04/02/2019  3:00 PM Donnamae Jude, MD [facility]    Verita Schneiders, MD [facility] [place]

## 2019-03-05 NOTE — Patient Instructions (Signed)
Return to office for any scheduled appointments. Call the office or go to the MAU at [facility] if:  You begin to have strong, frequent contractions  Your water breaks.  Sometimes it is a big gush of fluid, sometimes it is just a trickle that keeps getting your panties wet or running down your legs  You have vaginal bleeding.  It is normal to have a small amount of spotting if your cervix was checked.   You do not feel your baby moving like normal.  If you do not, get something to eat and drink and lay down and focus on feeling your baby move.   If your baby is still not moving like normal, you should call the office or go to MAU.  Any other obstetric concerns.   Postpartum Tubal Ligation Postpartum tubal ligation (PPTL) is a procedure to close the fallopian tubes. This is done so that you cannot get pregnant. When the fallopian tubes are closed, the eggs that the ovaries release cannot enter the uterus, and sperm cannot reach the eggs. PPTL is done right after childbirth or 1-2 days after childbirth, before the uterus returns to its normal location. If you have a cesarean section, it can be performed at the same time as the procedure. Having this done after childbirth does not make your stay in the hospital longer. PPTL is sometimes called "getting your tubes tied." You should not have this procedure if you want to get pregnant again or if you are unsure about having more children. Tell a health care provider about:  Any allergies you have.  All medicines you are taking, including vitamins, herbs, eye drops, creams, and over-the-counter medicines.  Any problems you or family members have had with anesthetic medicines.  Any blood disorders you have.  Any surgeries you have had.  Any medical conditions you have or have had.  Any past pregnancies. What are the risks? Generally, this is a safe procedure. However, problems may occur,  including:  Infection.  Bleeding.  Injury to other organs in the abdomen.  Side effects from anesthetic medicines.  Failure of the procedure. If this happens, you could get pregnant.  Having a fertilized egg attach outside the uterus (ectopic pregnancy). What happens before the procedure?  Ask your health care provider about: ? How much pain you can expect to have. ? What medicines you will be given for pain, especially if you are planning to breastfeed. What happens during the procedure? If you had a vaginal delivery:  You will be given one or more of the following: ? A medicine to help you relax (sedative). ? A medicine to numb the area (local anesthetic). ? A medicine to make you fall asleep (general anesthetic). ? A medicine that is injected into an area of your body to numb everything below the injection site (regional anesthetic).  If you have been given a general anesthetic, a tube will be put down your throat to help you breathe.  An IV will be inserted into one of your veins.  Your bladder may be emptied with a small tube (catheter).  An incision will be made just below your belly button.  Your fallopian tubes will be located and brought up through the incision.  Your fallopian tubes will be tied off, burned (cauterized), or blocked with a clip, ring, or clamp. A small part in the center of each fallopian tube may be removed.  The incision will be closed with  stitches (sutures).  A bandage (dressing) will be placed over the incision. If you had a cesarean delivery:  Tubal ligation will be done through the incision that was used for the cesarean delivery of your baby.  The incision will be closed with sutures.  A dressing will be placed over the incision. The procedure may vary among health care providers and hospitals. What happens after the procedure?  Your blood pressure, heart rate, breathing rate, and blood oxygen level will be monitored until you  leave the hospital.  You will be given pain medicine as needed.  Do not drive for 24 hours if you were given a sedative during your procedure. Summary  Postpartum tubal ligation is a procedure that closes the fallopian tubes so you cannot get pregnant anymore.  This procedure is done while you are still in the hospital after childbirth. If you have a cesarean section, it can be performed at the same time.  Having this done after childbirth does not make your stay in the hospital longer.  Postpartum tubal ligation is considered permanent. You should not have this procedure if you want to get pregnant again or if you are unsure about having more children.  Talk to your health care provider to see if this procedure is right for you. This information is not intended to replace advice given to you by your health care provider. Make sure you discuss any questions you have with your health care provider. Document Revised: 07/21/2018 Document Reviewed: 12/26/2017 Elsevier Patient Education  2020 [employer] Inc[employer]  

## 2019-03-09 DIAGNOSIS — Z3483 Encounter for supervision of other normal pregnancy, third trimester: Secondary | ICD-10-CM | POA: Diagnosis not present

## 2019-03-11 ENCOUNTER — Ambulatory Visit (HOSPITAL_COMMUNITY): Payer: BC Managed Care – PPO | Attending: Obstetrics and Gynecology | Admitting: *Deleted

## 2019-03-11 ENCOUNTER — Ambulatory Visit (HOSPITAL_COMMUNITY): Payer: BC Managed Care – PPO | Admitting: *Deleted

## 2019-03-11 ENCOUNTER — Other Ambulatory Visit: Payer: Self-pay

## 2019-03-11 ENCOUNTER — Encounter (HOSPITAL_COMMUNITY): Payer: Self-pay

## 2019-03-11 VITALS — BP 126/86 | HR 102 | Temp 98.2°F

## 2019-03-11 DIAGNOSIS — O09523 Supervision of elderly multigravida, third trimester: Secondary | ICD-10-CM

## 2019-03-11 DIAGNOSIS — M9903 Segmental and somatic dysfunction of lumbar region: Secondary | ICD-10-CM | POA: Diagnosis not present

## 2019-03-11 DIAGNOSIS — M5416 Radiculopathy, lumbar region: Secondary | ICD-10-CM | POA: Diagnosis not present

## 2019-03-11 DIAGNOSIS — M62830 Muscle spasm of back: Secondary | ICD-10-CM | POA: Diagnosis not present

## 2019-03-11 DIAGNOSIS — O24419 Gestational diabetes mellitus in pregnancy, unspecified control: Secondary | ICD-10-CM | POA: Insufficient documentation

## 2019-03-11 DIAGNOSIS — Z3A32 32 weeks gestation of pregnancy: Secondary | ICD-10-CM | POA: Diagnosis not present

## 2019-03-11 DIAGNOSIS — M9905 Segmental and somatic dysfunction of pelvic region: Secondary | ICD-10-CM | POA: Diagnosis not present

## 2019-03-11 NOTE — Procedures (Signed)
[name] Nov 13, 1975 [redacted]w[redacted]d  Fetus A Non-Stress Test Interpretation for 03/11/19  Indication: Gestational Diabetes medication controlled  Fetal Heart Rate A Mode: External Baseline Rate (A): 145 bpm Variability: Moderate Accelerations: 15 x 15 Decelerations: None Multiple birth?: No  Uterine Activity Mode: Palpation, Toco Contraction Frequency (min): none Resting Tone Palpated: Relaxed Resting Time: Adequate  Interpretation (Fetal Testing) Nonstress Test Interpretation: Reactive Comments: Reviewed tracing with Dr. Judeth Cornfield

## 2019-03-12 ENCOUNTER — Other Ambulatory Visit (HOSPITAL_COMMUNITY): Payer: BC Managed Care – PPO

## 2019-03-12 DIAGNOSIS — F411 Generalized anxiety disorder: Secondary | ICD-10-CM | POA: Diagnosis not present

## 2019-03-17 DIAGNOSIS — M5416 Radiculopathy, lumbar region: Secondary | ICD-10-CM | POA: Diagnosis not present

## 2019-03-17 DIAGNOSIS — M9903 Segmental and somatic dysfunction of lumbar region: Secondary | ICD-10-CM | POA: Diagnosis not present

## 2019-03-17 DIAGNOSIS — M9905 Segmental and somatic dysfunction of pelvic region: Secondary | ICD-10-CM | POA: Diagnosis not present

## 2019-03-17 DIAGNOSIS — M62830 Muscle spasm of back: Secondary | ICD-10-CM | POA: Diagnosis not present

## 2019-03-19 ENCOUNTER — Telehealth (INDEPENDENT_AMBULATORY_CARE_PROVIDER_SITE_OTHER): Payer: BC Managed Care – PPO | Admitting: Family Medicine

## 2019-03-19 ENCOUNTER — Other Ambulatory Visit: Payer: Self-pay

## 2019-03-19 VITALS — BP 132/85

## 2019-03-19 DIAGNOSIS — O0993 Supervision of high risk pregnancy, unspecified, third trimester: Secondary | ICD-10-CM

## 2019-03-19 DIAGNOSIS — O24415 Gestational diabetes mellitus in pregnancy, controlled by oral hypoglycemic drugs: Secondary | ICD-10-CM

## 2019-03-19 DIAGNOSIS — Z3A34 34 weeks gestation of pregnancy: Secondary | ICD-10-CM

## 2019-03-19 DIAGNOSIS — O0990 Supervision of high risk pregnancy, unspecified, unspecified trimester: Secondary | ICD-10-CM

## 2019-03-19 DIAGNOSIS — O09523 Supervision of elderly multigravida, third trimester: Secondary | ICD-10-CM

## 2019-03-19 NOTE — Patient Instructions (Signed)

## 2019-03-20 ENCOUNTER — Ambulatory Visit (HOSPITAL_COMMUNITY)
Admission: RE | Admit: 2019-03-20 | Discharge: 2019-03-20 | Disposition: A | Discharge status: Home / Self Care | Payer: BC Managed Care – PPO | Source: Ambulatory Visit | Attending: Obstetrics and Gynecology | Admitting: Obstetrics and Gynecology

## 2019-03-20 ENCOUNTER — Encounter (HOSPITAL_COMMUNITY): Payer: Self-pay

## 2019-03-20 ENCOUNTER — Encounter: Payer: Self-pay | Admitting: Family Medicine

## 2019-03-20 ENCOUNTER — Ambulatory Visit (HOSPITAL_COMMUNITY): Payer: BC Managed Care – PPO | Admitting: *Deleted

## 2019-03-20 VITALS — BP 141/85 | HR 95 | Temp 97.1°F

## 2019-03-20 DIAGNOSIS — O09529 Supervision of elderly multigravida, unspecified trimester: Secondary | ICD-10-CM | POA: Diagnosis not present

## 2019-03-20 DIAGNOSIS — Z3A33 33 weeks gestation of pregnancy: Secondary | ICD-10-CM

## 2019-03-20 DIAGNOSIS — E669 Obesity, unspecified: Secondary | ICD-10-CM

## 2019-03-20 DIAGNOSIS — O99213 Obesity complicating pregnancy, third trimester: Secondary | ICD-10-CM | POA: Diagnosis not present

## 2019-03-20 DIAGNOSIS — O09523 Supervision of elderly multigravida, third trimester: Secondary | ICD-10-CM | POA: Diagnosis not present

## 2019-03-20 IMAGING — US US MFM FETAL BPP W/O NON-STRESS
1 series · 15 of 28 positions shown · non-contrast
Comparison: none

[Series 1: us mfm fetal bpp w/o non-stress · 29 acquisitions, 15 frames shown]
[im 1/29]
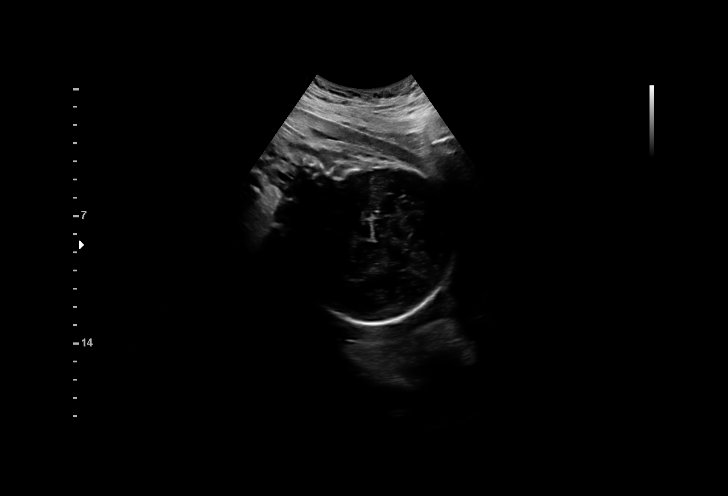
[im 3/29]
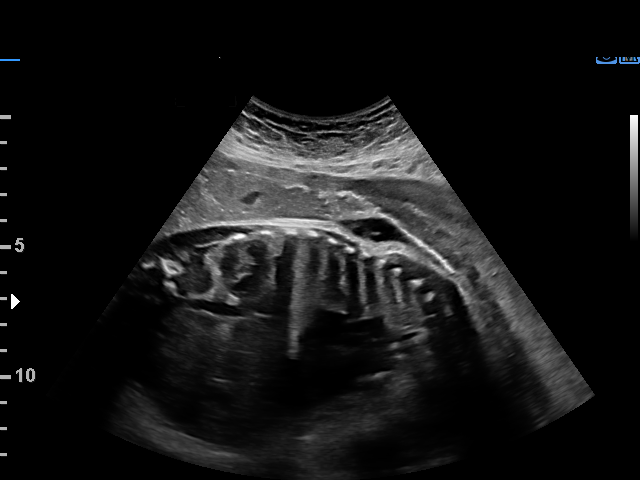
[im 5/29]
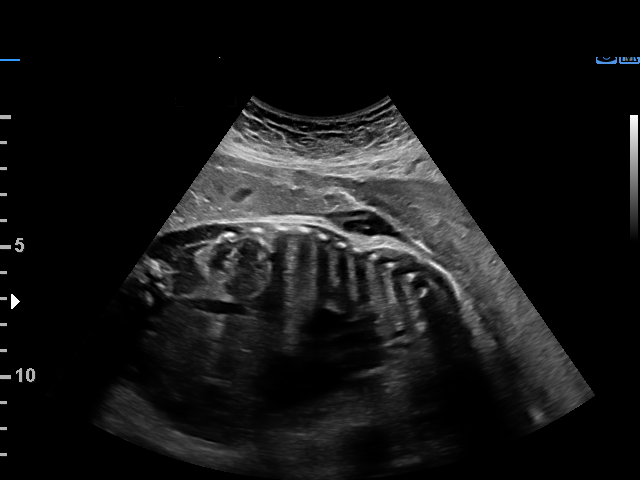
[im 7/29]
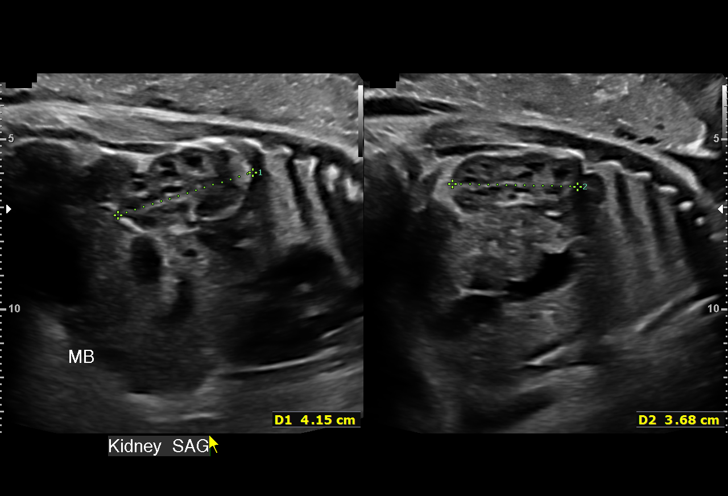
[im 9/29]
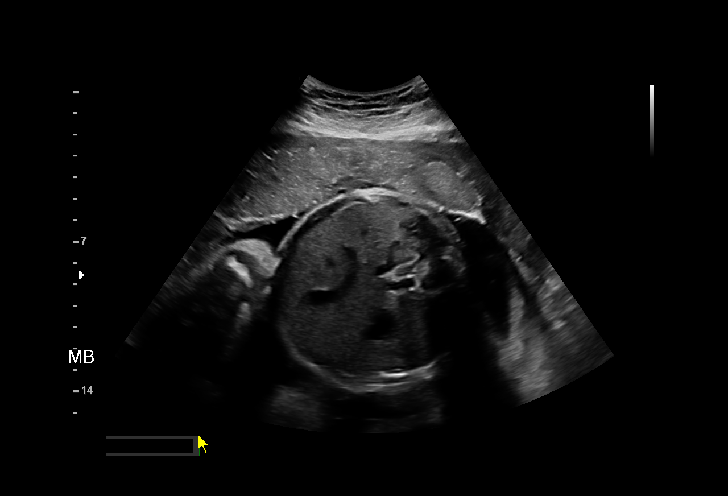
[im 11/29]
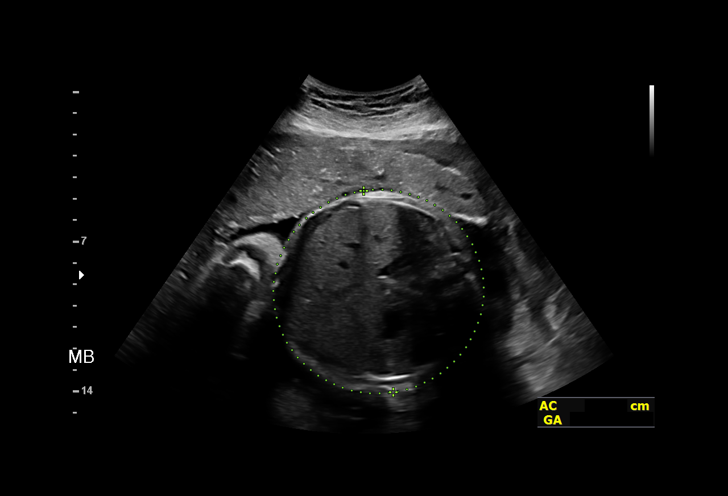
[im 13/29]
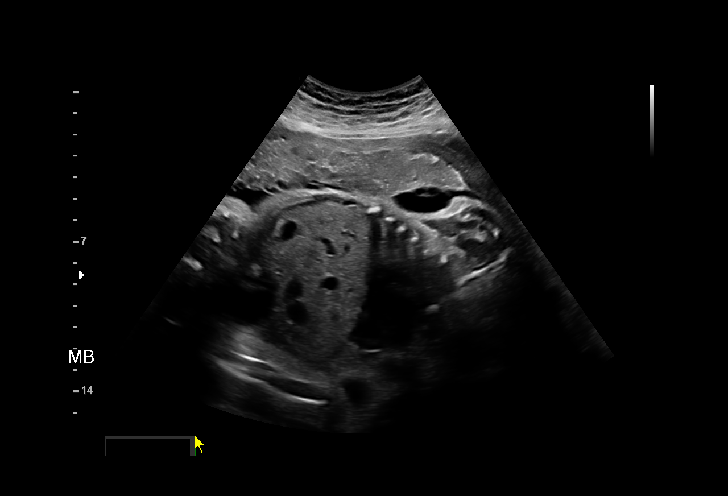
[im 15/29]
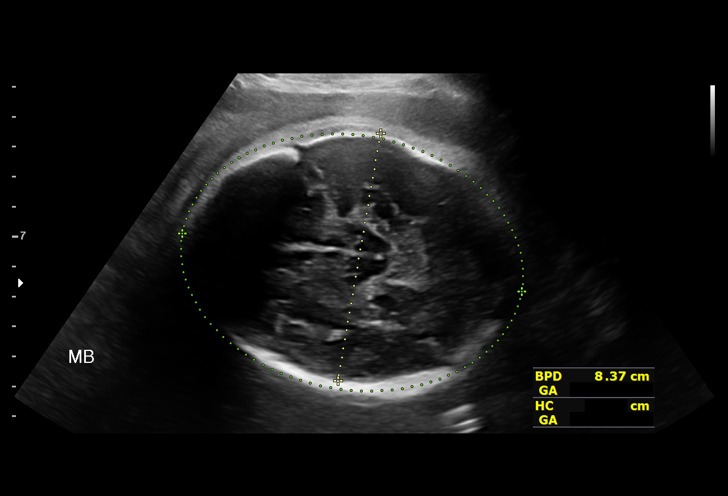
[im 16/29]
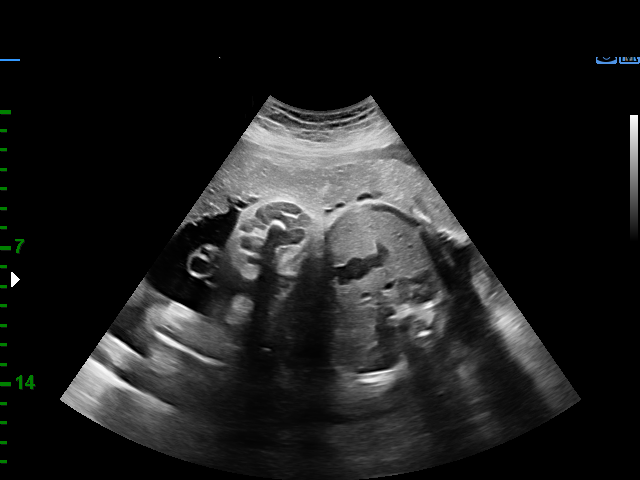
[im 18/29]
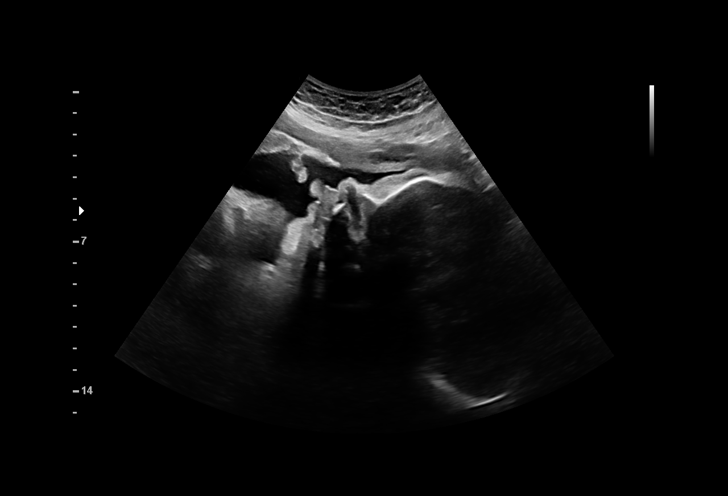
[im 20/29]
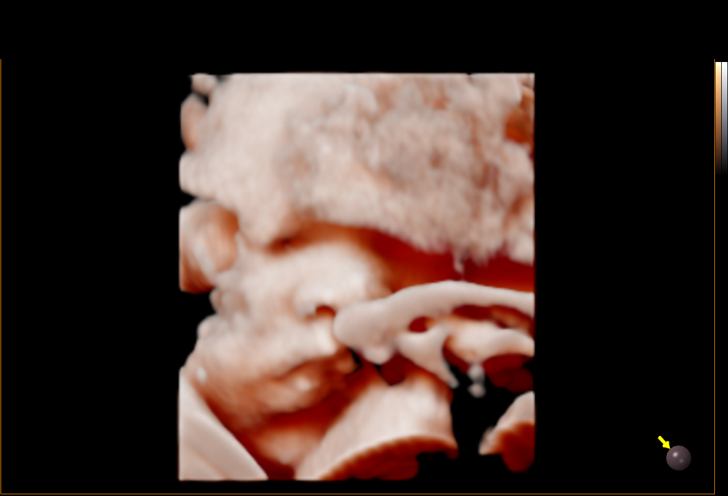
[im 22/29]
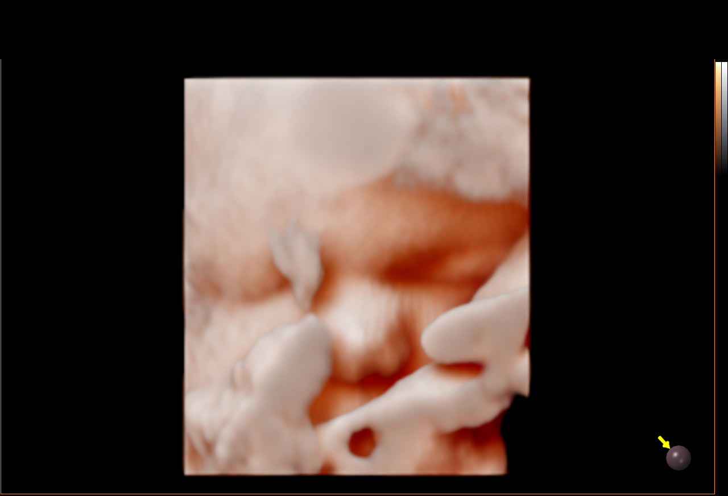
[im 24/29]
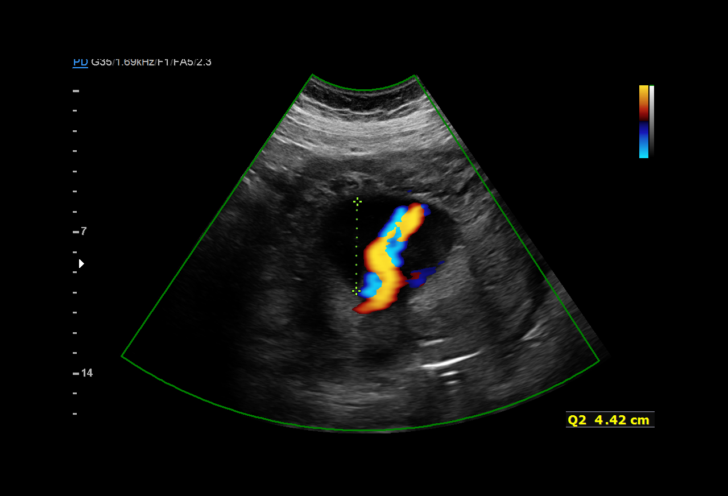
[im 26/29]
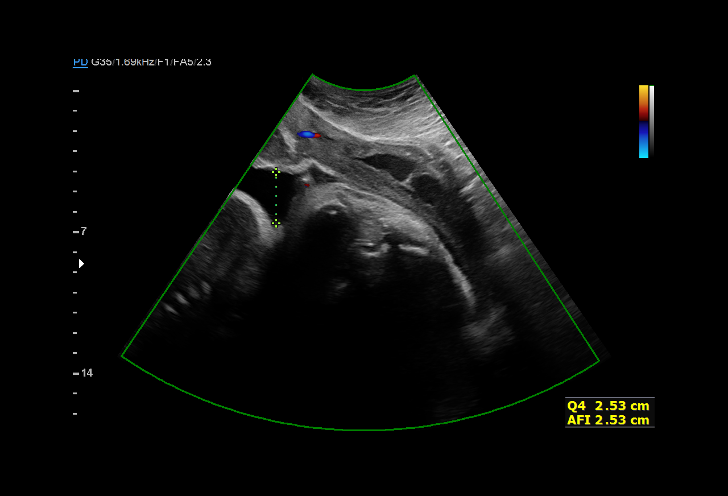
[im 29/29]
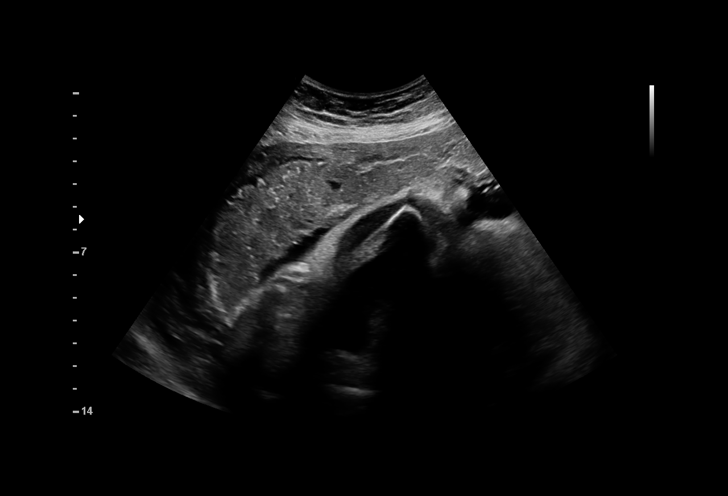

[15 of 28 positions shown; findings below may reference images not displayed]

----------------------------------------------------------------------

 ----------------------------------------------------------------------
Indications

  Advanced maternal age multigravida 35+,        [R0]
  second trimester (43 yrs)
  Obesity complicating pregnancy                 [R0] [R0]
  (PREGRAVIDA BMI 33)
  Low Risk NIPS
  33 weeks gestation of pregnancy
 ----------------------------------------------------------------------
Vital Signs

 BMI:
Fetal Evaluation

 Num Of Fetuses:         1
 Fetal Heart Rate(bpm):  131
 Cardiac Activity:       Observed
 Presentation:           Cephalic
 Placenta:               Anterior

 Amniotic Fluid
 AFI FV:      Within normal limits

 AFI Sum(cm)     %Tile       Largest Pocket(cm)
 18.36           68

 RUQ(cm)       RLQ(cm)       LUQ(cm)        LLQ(cm)

Biophysical Evaluation

 Amniotic F.V:   Within normal limits       F. Tone:        Observed
 F. Movement:    Observed                   Score:          [DATE]
 F. Breathing:   Observed
OB History

 Gravidity:    4         Term:   1         SAB:   2
 Living:       1
Gestational Age

 LMP:           34w 0d        Date:  [DATE]                 EDD:   [DATE]
 Best:          33w 6d     Det. By:  Previous Ultrasound      EDD:   [DATE]
                                     ([DATE])
Anatomy

 Thoracic:              Appears normal         Bladder:                Appears normal
 Stomach:               Appears normal, left
                        sided
Cervix Uterus Adnexa

 Cervix
 Not visualized (advanced GA >[R0])
Comments

 This patient was seen for a biophysical profile due to
 advanced maternal age and A2 gestational diabetes currently
 treated with Metformin 500 mg daily.  Her fingerstick values
 have mostly been within normal limits with an occasional
 elevated fingerstick value.
 A biophysical profile performed today was [DATE].
 There was normal amniotic fluid noted on today's ultrasound
 exam.
 A follow-up biophysical profile was scheduled in 1 week.

## 2019-03-20 NOTE — Progress Notes (Signed)
TELEHEALTH OBSTETRICS PRENATAL VIRTUAL VIDEO VISIT ENCOUNTER NOTE  Provider location: [facility]   I connected with [name] on 03/20/19 at  4:00 PM EST by MyChart Video Encounter at home and verified that I am speaking with the correct person using two identifiers.   I discussed the limitations, risks, security and privacy concerns of performing an evaluation and management service virtually and the availability of in person appointments. I also discussed with the patient that there may be a patient responsible charge related to this service. The patient expressed understanding and agreed to proceed. Subjective:  [name] is a 44 y.o. G4P1021 at [redacted]w[redacted]d being seen today for ongoing prenatal care.  She is currently monitored for the following issues for this high-risk pregnancy and has Depression, recurrent (HCC); MDD (major depressive disorder); Supervision of high risk pregnancy, antepartum; AMA (advanced maternal age) multigravida 43+; Obesity in pregnancy; GDM (gestational diabetes mellitus); BMI 30s; and ? History of herpes genitalis on their problem list.  Patient reports no complaints.  Contractions: Not present. Vag. Bleeding: None.  Movement: Present. Denies any leaking of fluid.   The following portions of the patient's history were reviewed and updated as appropriate: allergies, current medications, past family history, past medical history, past social history, past surgical history and problem list.   Objective:   Vitals:   03/19/19 1607  BP: 132/85    Fetal Status:     Movement: Present     General:  Alert, oriented and cooperative. Patient is in no acute distress.  Respiratory: Normal respiratory effort, no problems with respiration noted  Mental Status: Normal mood and affect. Normal behavior. Normal judgment and thought content.  Rest of physical exam deferred due to type of encounter  Imaging: [place] MFM FETAL BPP  WO NON STRESS  Result Date: 03/05/2019 ----------------------------------------------------------------------  OBSTETRICS REPORT                       (Signed Final 03/05/2019 01:43 pm) ---------------------------------------------------------------------- Patient Info  ID #:       [id number]                          D.O.B.:  [birth year] (43 yrs)  Name:       [name]            Visit Date: 03/05/2019 12:56 pm ---------------------------------------------------------------------- Performed By  Performed By:     Lenise Arena        Ref. Address:     [address]                    [address]                                                             Rd  Attending:        Ma Rings MD         [facility]  Referred By:      Federico Flake MD ---------------------------------------------------------------------- Orders   #  Description                          Code         Ordered By   1  [place] MFM OB FOLLOW UP                  76816.01     RAVI SHANKAR   2  [place] MFM FETAL BPP WO NON              76819.01     CHARLIE PICKENS      STRESS  ----------------------------------------------------------------------   #  Order #                    Accession #                 Episode #   1  [id number]                  [id number]                  [id number]   2  [id number]                  [id number]                  [id number]  ---------------------------------------------------------------------- Indications   Advanced maternal age multigravida 64+,        O51.522   second trimester (43 yrs)   Obesity complicating pregnancy                 O99.210 E66.9   (PREGRAVIDA BMI 33)   Low Risk NIPS   Encounter for other antenatal screening        Z36.2   follow-up   [redacted] weeks gestation of pregnancy                Z3A.31  ---------------------------------------------------------------------- Vital Signs  Weight  (lb): 216                               Height:        5'4"  BMI:         37.07 ---------------------------------------------------------------------- Fetal Evaluation  Num Of Fetuses:         1  Cardiac Activity:       Observed  Presentation:           Cephalic  Placenta:               Anterior  P. Cord Insertion:      Previously Visualized  Amniotic Fluid  AFI FV:      Within normal limits  AFI Sum(cm)     %Tile       Largest Pocket(cm)  16.64           60          4.9  RUQ(cm)       RLQ(cm)       LUQ(cm)        LLQ(cm)  3.72          3.82          4.2  4.9 ---------------------------------------------------------------------- Biophysical Evaluation  Amniotic F.V:   Within normal limits       F. Tone:        Observed  F. Movement:    Observed                   Score:          8/8  F. Breathing:   Observed ---------------------------------------------------------------------- Biometry  BPD:      81.2  mm     G. Age:  32w 4d         68  %    CI:        74.94   %    70 - 86                                                          FL/HC:      19.9   %    19.1 - 21.3  HC:      297.6  mm     G. Age:  33w 0d         46  %    HC/AC:      1.03        0.96 - 1.17  AC:       290   mm     G. Age:  33w 0d         83  %    FL/BPD:     72.9   %    71 - 87  FL:       59.2  mm     G. Age:  30w 6d         17  %    FL/AC:      20.4   %    20 - 24  Est. FW:    1953  gm      4 lb 5 oz     60  % ---------------------------------------------------------------------- OB History  Gravidity:    4         Term:   1         SAB:   2  Living:       1 ---------------------------------------------------------------------- Gestational Age  LMP:           31w 6d        Date:  07/25/18                 EDD:   05/01/19  U/S Today:     32w 3d                                        EDD:   04/27/19  Best:          31w 5d     Det. By:  Previous Ultrasound      EDD:   05/02/19                                      (09/18/18)  ---------------------------------------------------------------------- Anatomy  Cranium:  Appears normal         Aortic Arch:            Previously seen  Cavum:                 Appears normal         Ductal Arch:            Not well visualized  Ventricles:            Appears normal         Diaphragm:              Appears normal  Choroid Plexus:        Previously seen        Stomach:                Appears normal, left                                                                        sided  Cerebellum:            Previously seen        Abdomen:                Appears normal  Posterior Fossa:       Previously seen        Abdominal Wall:         Previously seen  Nuchal Fold:           Previously seen        Cord Vessels:           Previously seen  Face:                  Orbits and profile     Kidneys:                Appear normal                         previously seen  Lips:                  Previously seen        Bladder:                Appears normal  Thoracic:              Appears normal         Spine:                  Previously seen  Heart:                 Previously seen        Upper Extremities:      Previously seen  RVOT:                  Previously seen        Lower Extremities:      Previously seen  LVOT:                  Previously seen  Other:  Technically difficult due to maternal habitus and fetal position. ---------------------------------------------------------------------- Cervix Uterus Adnexa  Cervix  Not visualized (  advanced GA >24wks) ---------------------------------------------------------------------- Comments  This patient was seen for a follow up growth scan due to  maternal obesity and advanced maternal age.  She reports  that she was recently diagnosed with A2 gestational diabetes.  She is currently treated with Metformin.  She was informed that the fetal growth and amniotic fluid  level appears appropriate for her gestational age.  A biophysical profile performed today  was 8 out of 8.  As she is over the age of 77 and due to A2 gestational  diabetes, we will continue to follow her with weekly fetal  testing.  A follow-up exam was scheduled in 1 week. ----------------------------------------------------------------------                   Ma Rings, MD Electronically Signed Final Report   03/05/2019 01:43 pm ----------------------------------------------------------------------  [place] MFM OB FOLLOW UP  Result Date: 03/05/2019 ----------------------------------------------------------------------  OBSTETRICS REPORT                       (Signed Final 03/05/2019 01:43 pm) ---------------------------------------------------------------------- Patient Info  ID #:       [id number]                          D.O.B.:  [birth year] (43 yrs)  Name:       [name]            Visit Date: 03/05/2019 12:56 pm ---------------------------------------------------------------------- Performed By  Performed By:     Lenise Arena        Ref. Address:     [address]                    [address]                                                             Rd  Attending:        Ma Rings MD         [facility]  Referred By:      Federico Flake MD ---------------------------------------------------------------------- Orders   #  Description                          Code         Ordered By   1  [place] MFM OB FOLLOW UP                  08144.81     RAVI SHANKAR   2  [place] MFM FETAL BPP WO NON              76819.01     CHARLIE PICKENS  STRESS  ----------------------------------------------------------------------   #  Order #                    Accession #                 Episode #   1  [id number]                  [id number]                  [id number]   2  [id number]                  [id number]                  [id number]   ---------------------------------------------------------------------- Indications   Advanced maternal age multigravida 51+,        O50.522   second trimester (82 yrs)   Obesity complicating pregnancy                 O99.210 E66.9   (PREGRAVIDA BMI 33)   Low Risk NIPS   Encounter for other antenatal screening        Z36.2   follow-up   [redacted] weeks gestation of pregnancy                Z3A.31  ---------------------------------------------------------------------- Vital Signs  Weight (lb): 216                               Height:        5'4"  BMI:         37.07 ---------------------------------------------------------------------- Fetal Evaluation  Num Of Fetuses:         1  Cardiac Activity:       Observed  Presentation:           Cephalic  Placenta:               Anterior  P. Cord Insertion:      Previously Visualized  Amniotic Fluid  AFI FV:      Within normal limits  AFI Sum(cm)     %Tile       Largest Pocket(cm)  16.64           60          4.9  RUQ(cm)       RLQ(cm)       LUQ(cm)        LLQ(cm)  3.72          3.82          4.2            4.9 ---------------------------------------------------------------------- Biophysical Evaluation  Amniotic F.V:   Within normal limits       F. Tone:        Observed  F. Movement:    Observed                   Score:          8/8  F. Breathing:   Observed ---------------------------------------------------------------------- Biometry  BPD:      81.2  mm     G. Age:  32w 4d         68  %    CI:        74.94   %    70 - 86  FL/HC:      19.9   %    19.1 - 21.3  HC:      297.6  mm     G. Age:  33w 0d         46  %    HC/AC:      1.03        0.96 - 1.17  AC:       290   mm     G. Age:  33w 0d         83  %    FL/BPD:     72.9   %    71 - 87  FL:       59.2  mm     G. Age:  30w 6d         17  %    FL/AC:      20.4   %    20 - 24  Est. FW:    1953  gm      4 lb 5 oz     60  %  ---------------------------------------------------------------------- OB History  Gravidity:    4         Term:   1         SAB:   2  Living:       1 ---------------------------------------------------------------------- Gestational Age  LMP:           31w 6d        Date:  07/25/18                 EDD:   05/01/19  U/S Today:     32w 3d                                        EDD:   04/27/19  Best:          31w 5d     Det. By:  Previous Ultrasound      EDD:   05/02/19                                      (09/18/18) ---------------------------------------------------------------------- Anatomy  Cranium:               Appears normal         Aortic Arch:            Previously seen  Cavum:                 Appears normal         Ductal Arch:            Not well visualized  Ventricles:            Appears normal         Diaphragm:              Appears normal  Choroid Plexus:        Previously seen        Stomach:                Appears normal, left  sided  Cerebellum:            Previously seen        Abdomen:                Appears normal  Posterior Fossa:       Previously seen        Abdominal Wall:         Previously seen  Nuchal Fold:           Previously seen        Cord Vessels:           Previously seen  Face:                  Orbits and profile     Kidneys:                Appear normal                         previously seen  Lips:                  Previously seen        Bladder:                Appears normal  Thoracic:              Appears normal         Spine:                  Previously seen  Heart:                 Previously seen        Upper Extremities:      Previously seen  RVOT:                  Previously seen        Lower Extremities:      Previously seen  LVOT:                  Previously seen  Other:  Technically difficult due to maternal habitus and fetal position. ----------------------------------------------------------------------  Cervix Uterus Adnexa  Cervix  Not visualized (advanced GA >24wks) ---------------------------------------------------------------------- Comments  This patient was seen for a follow up growth scan due to  maternal obesity and advanced maternal age.  She reports  that she was recently diagnosed with A2 gestational diabetes.  She is currently treated with Metformin.  She was informed that the fetal growth and amniotic fluid  level appears appropriate for her gestational age.  A biophysical profile performed today was 8 out of 8.  As she is over the age of 44 and due to A2 gestational  diabetes, we will continue to follow her with weekly fetal  testing.  A follow-up exam was scheduled in 1 week. ----------------------------------------------------------------------                   Ma RingsVictor Fang, MD Electronically Signed Final Report   03/05/2019 01:43 pm ----------------------------------------------------------------------   Assessment and Plan:  Pregnancy: [id number] at 7276w0d 1. Supervision of high risk pregnancy, antepartum For cultures next visit  2. Gestational diabetes mellitus (GDM) in third trimester controlled on oral hypoglycemic drug See babyscripts log--CBGs are well controlled  3. Multigravida of advanced maternal age in third trimester Low risk NIPT  Preterm labor symptoms and general obstetric precautions including but not limited to vaginal bleeding, contractions, leaking of fluid  and fetal movement were reviewed in detail with the patient. I discussed the assessment and treatment plan with the patient. The patient was provided an opportunity to ask questions and all were answered. The patient agreed with the plan and demonstrated an understanding of the instructions. The patient was advised to call back or seek an in-person office evaluation/go to [facility] for any urgent or concerning symptoms. Please refer to After Visit Summary for other counseling  recommendations.   I provided 11 minutes of face-to-face time during this encounter.  Return in 2 weeks (on 04/02/2019).  Future Appointments  Date Time Provider Department Center  03/20/2019  1:15 PM [facility] NURSE [facility]  03/20/2019  1:15 PM [facility] [place] 4 WH-MFCUS M[facility]-US  03/26/2019 10:50 AM [facility] NURSE [facility]  03/26/2019 11:00 AM [facility] [place] 3 WH-MFCUS MFC-US  04/02/2019  9:30 AM [facility] NURSE WH-MFC MFC-US  04/02/2019  9:30 AM [facility] [place] 1 WH-MFCUS MFC-US  04/02/2019  3:00 PM Reva Bores, MD [facility]    Reva Bores, MD [facility], [facility]

## 2019-03-24 ENCOUNTER — Other Ambulatory Visit: Payer: Self-pay

## 2019-03-24 DIAGNOSIS — M5416 Radiculopathy, lumbar region: Secondary | ICD-10-CM | POA: Diagnosis not present

## 2019-03-24 DIAGNOSIS — M9905 Segmental and somatic dysfunction of pelvic region: Secondary | ICD-10-CM | POA: Diagnosis not present

## 2019-03-24 DIAGNOSIS — M9903 Segmental and somatic dysfunction of lumbar region: Secondary | ICD-10-CM | POA: Diagnosis not present

## 2019-03-24 DIAGNOSIS — M62830 Muscle spasm of back: Secondary | ICD-10-CM | POA: Diagnosis not present

## 2019-03-26 ENCOUNTER — Encounter (HOSPITAL_COMMUNITY): Payer: Self-pay

## 2019-03-26 ENCOUNTER — Other Ambulatory Visit: Payer: Self-pay

## 2019-03-26 ENCOUNTER — Ambulatory Visit (HOSPITAL_COMMUNITY)
Admission: RE | Admit: 2019-03-26 | Discharge: 2019-03-26 | Disposition: A | Discharge status: Home / Self Care | Payer: BC Managed Care – PPO | Source: Ambulatory Visit | Attending: Obstetrics and Gynecology | Admitting: Obstetrics and Gynecology

## 2019-03-26 ENCOUNTER — Ambulatory Visit (HOSPITAL_COMMUNITY): Payer: BC Managed Care – PPO | Admitting: *Deleted

## 2019-03-26 VITALS — BP 137/82 | HR 96 | Temp 97.3°F | Wt 220.0 lb

## 2019-03-26 DIAGNOSIS — O09523 Supervision of elderly multigravida, third trimester: Secondary | ICD-10-CM

## 2019-03-26 DIAGNOSIS — O09529 Supervision of elderly multigravida, unspecified trimester: Secondary | ICD-10-CM | POA: Insufficient documentation

## 2019-03-26 DIAGNOSIS — M9903 Segmental and somatic dysfunction of lumbar region: Secondary | ICD-10-CM | POA: Diagnosis not present

## 2019-03-26 DIAGNOSIS — Z3A34 34 weeks gestation of pregnancy: Secondary | ICD-10-CM | POA: Diagnosis not present

## 2019-03-26 DIAGNOSIS — M62830 Muscle spasm of back: Secondary | ICD-10-CM | POA: Diagnosis not present

## 2019-03-26 DIAGNOSIS — M9905 Segmental and somatic dysfunction of pelvic region: Secondary | ICD-10-CM | POA: Diagnosis not present

## 2019-03-26 DIAGNOSIS — M5416 Radiculopathy, lumbar region: Secondary | ICD-10-CM | POA: Diagnosis not present

## 2019-03-26 IMAGING — US US MFM FETAL BPP W/O NON-STRESS
1 series · 12 of 28 positions shown · non-contrast
Comparison: none

[Series 1: us mfm fetal bpp w/o non-stress · 29 acquisitions, 12 frames shown]
[im 2/29]
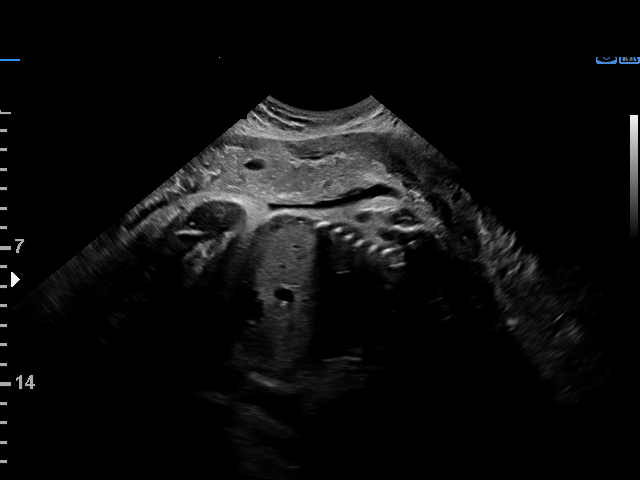
[im 4/29]
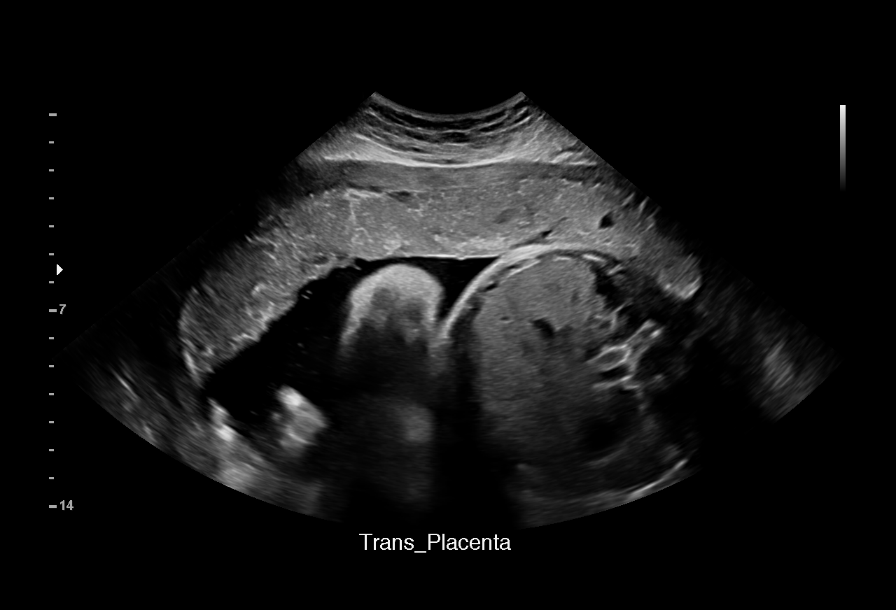
[im 6/29]
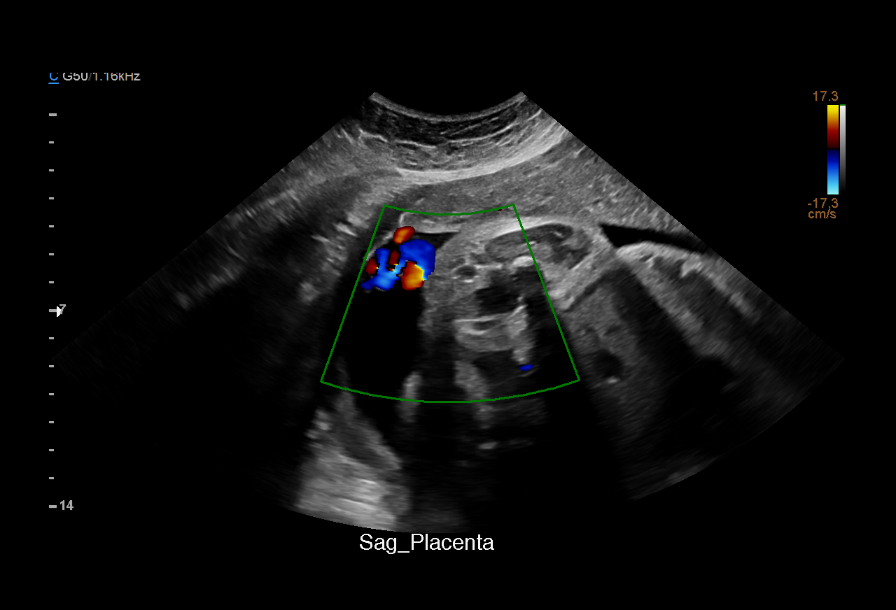
[im 9/29]
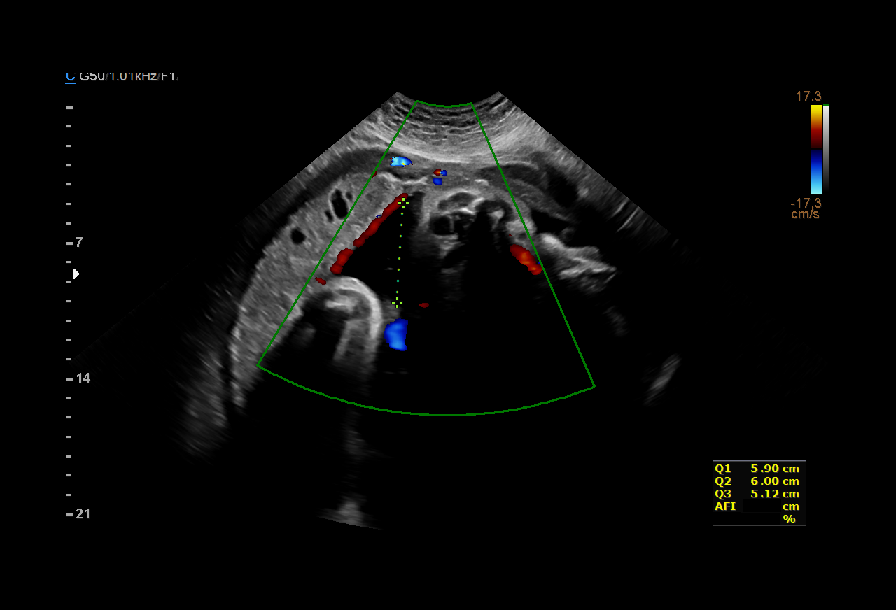
[im 11/29]
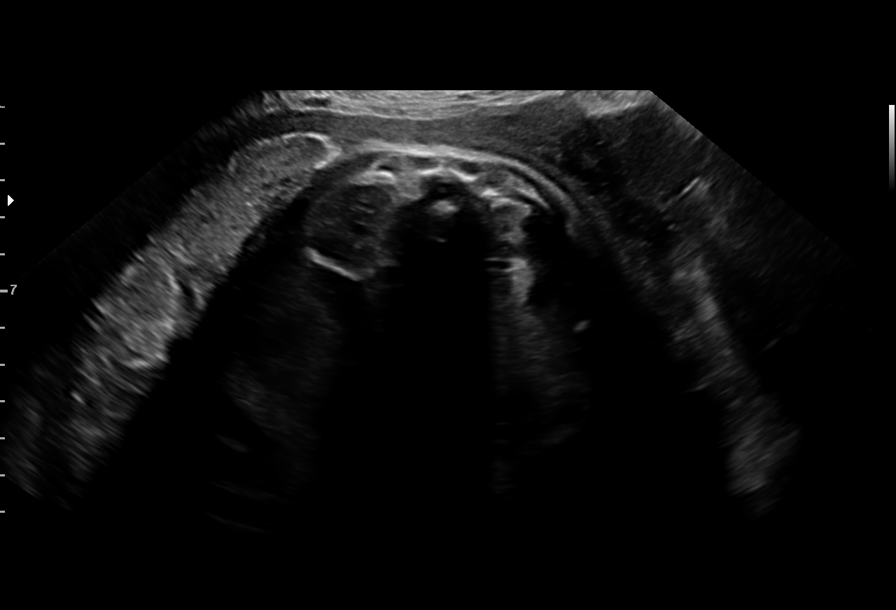
[im 13/29]
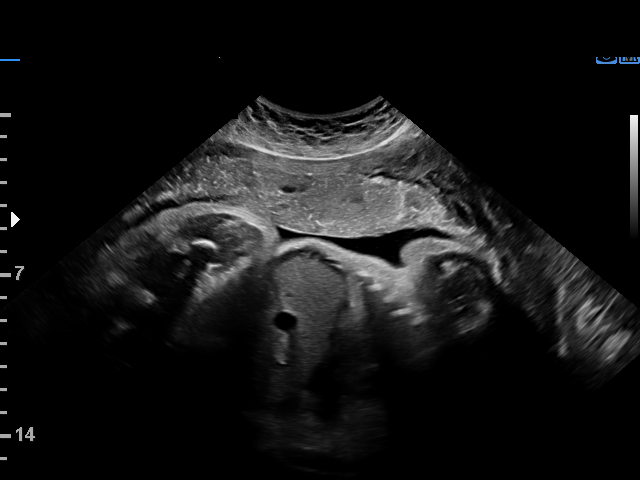
[im 16/29]
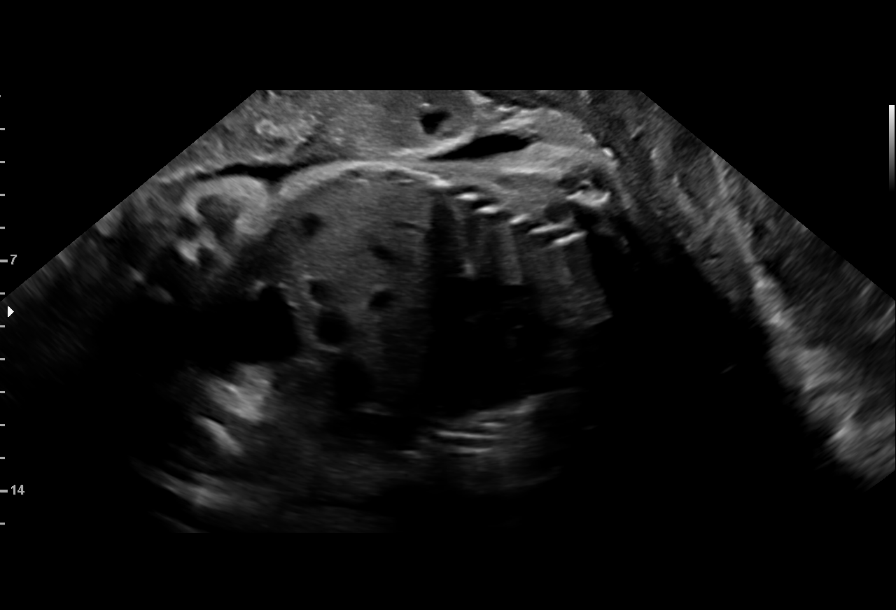
[im 18/29]
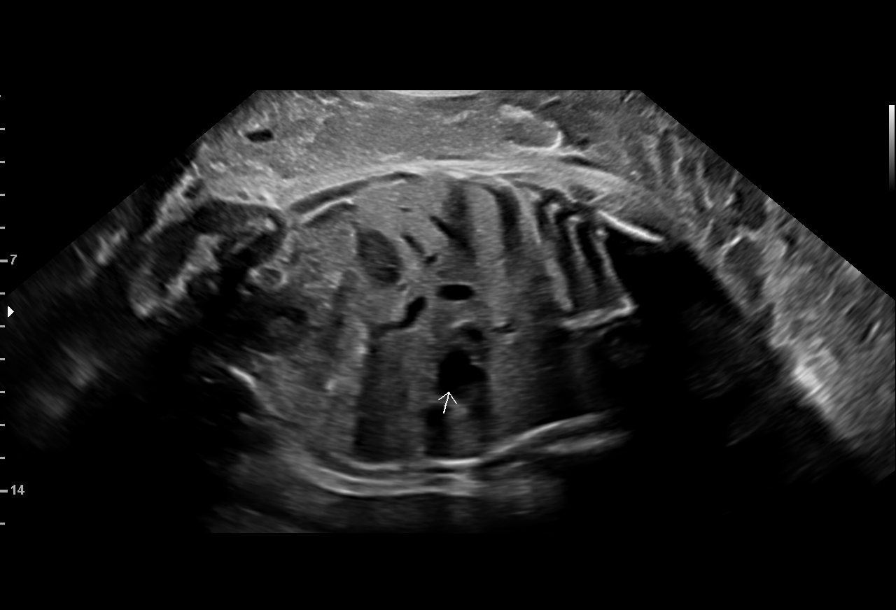
[im 20/29]
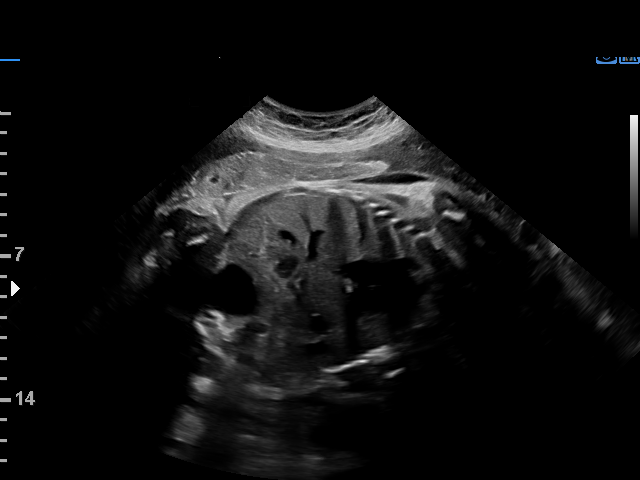
[im 23/29]
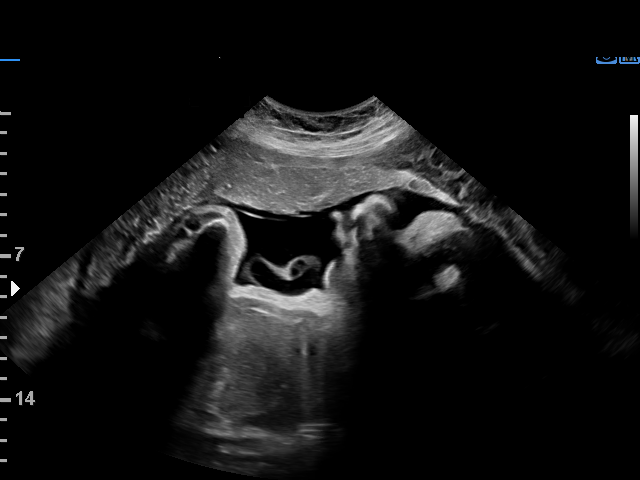
[im 25/29]
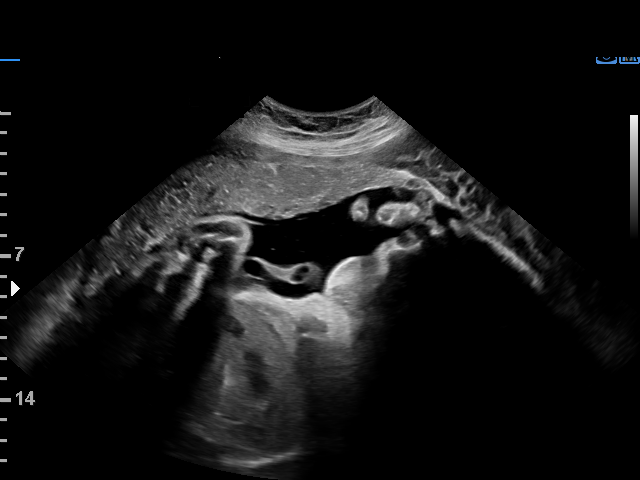
[im 27/29]
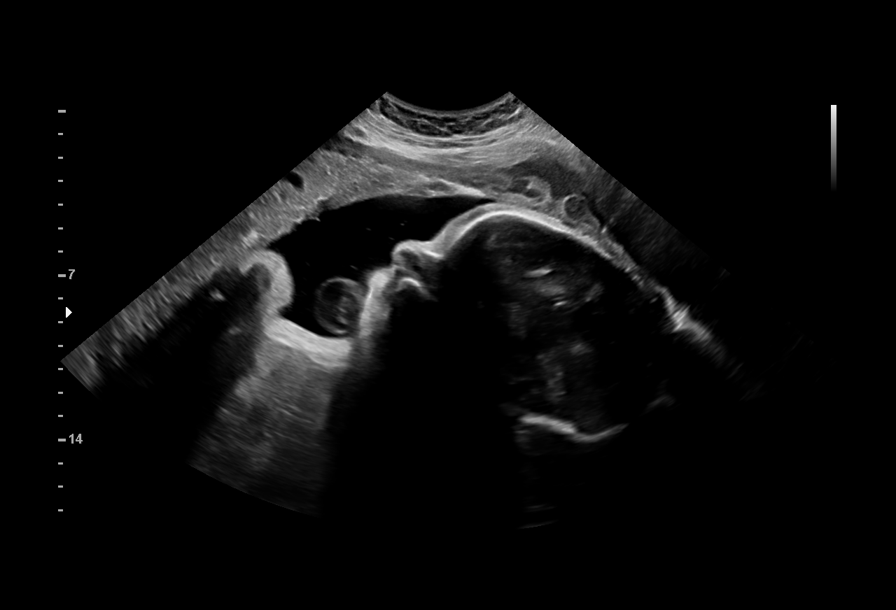

[12 of 28 positions shown; findings below may reference images not displayed]

----------------------------------------------------------------------

 ----------------------------------------------------------------------
Indications

  Advanced maternal age multigravida 35+,        [IG]
  second trimester (43 yrs)
  Obesity complicating pregnancy                 [IG] [IG]
  (PREGRAVIDA BMI 33)
  Low Risk NIPS
  34 weeks gestation of pregnancy
 ----------------------------------------------------------------------
Vital Signs

                                                Height:        5'4"
Fetal Evaluation

 Num Of Fetuses:         1
 Fetal Heart Rate(bpm):  131
 Cardiac Activity:       Observed
 Presentation:           Cephalic
 Placenta:               Anterior
 P. Cord Insertion:      Previously Visualized

 AFI Sum(cm)     %Tile       Largest Pocket(cm)
 21.02           79          6

 RUQ(cm)       RLQ(cm)       LUQ(cm)        LLQ(cm)
 5.9           4             6
Biophysical Evaluation

 Amniotic F.V:   Within normal limits       F. Tone:        Observed
 F. Movement:    Observed                   Score:          [DATE]
 F. Breathing:   Observed
OB History

 Gravidity:    4         Term:   1         SAB:   2
 Living:       1
Gestational Age

 LMP:           34w 6d        Date:  [DATE]                 EDD:   [DATE]
 Best:          34w 5d     Det. By:  Previous Ultrasound      EDD:   [DATE]
                                     ([DATE])
Comments

 A biophysical profile performed today Due to A2 gestational
 diabeteswas [DATE].
 There was normal amniotic fluid noted on today's ultrasound
 exam.
 Another biophysical profile was scheduled in 1 week.

## 2019-03-31 DIAGNOSIS — M9903 Segmental and somatic dysfunction of lumbar region: Secondary | ICD-10-CM | POA: Diagnosis not present

## 2019-03-31 DIAGNOSIS — M9905 Segmental and somatic dysfunction of pelvic region: Secondary | ICD-10-CM | POA: Diagnosis not present

## 2019-03-31 DIAGNOSIS — M5416 Radiculopathy, lumbar region: Secondary | ICD-10-CM | POA: Diagnosis not present

## 2019-03-31 DIAGNOSIS — M62830 Muscle spasm of back: Secondary | ICD-10-CM | POA: Diagnosis not present

## 2019-04-02 ENCOUNTER — Other Ambulatory Visit: Payer: Self-pay

## 2019-04-02 ENCOUNTER — Ambulatory Visit (HOSPITAL_COMMUNITY): Payer: BC Managed Care – PPO | Admitting: *Deleted

## 2019-04-02 ENCOUNTER — Ambulatory Visit (INDEPENDENT_AMBULATORY_CARE_PROVIDER_SITE_OTHER): Payer: BC Managed Care – PPO | Admitting: Family Medicine

## 2019-04-02 ENCOUNTER — Ambulatory Visit (HOSPITAL_COMMUNITY)
Admission: RE | Admit: 2019-04-02 | Discharge: 2019-04-02 | Disposition: A | Discharge status: Home / Self Care | Payer: BC Managed Care – PPO | Source: Ambulatory Visit | Attending: Obstetrics and Gynecology | Admitting: Obstetrics and Gynecology

## 2019-04-02 ENCOUNTER — Other Ambulatory Visit (HOSPITAL_COMMUNITY)
Admission: RE | Admit: 2019-04-02 | Discharge: 2019-04-02 | Disposition: A | Discharge status: Home / Self Care | Payer: BC Managed Care – PPO | Source: Ambulatory Visit | Attending: Family Medicine | Admitting: Family Medicine

## 2019-04-02 ENCOUNTER — Encounter (HOSPITAL_COMMUNITY): Payer: Self-pay

## 2019-04-02 VITALS — BP 122/83 | HR 101 | Temp 97.3°F

## 2019-04-02 VITALS — BP 134/87 | HR 94 | Wt 222.0 lb

## 2019-04-02 DIAGNOSIS — E669 Obesity, unspecified: Secondary | ICD-10-CM

## 2019-04-02 DIAGNOSIS — Z3A35 35 weeks gestation of pregnancy: Secondary | ICD-10-CM | POA: Diagnosis not present

## 2019-04-02 DIAGNOSIS — O09523 Supervision of elderly multigravida, third trimester: Secondary | ICD-10-CM

## 2019-04-02 DIAGNOSIS — O24415 Gestational diabetes mellitus in pregnancy, controlled by oral hypoglycemic drugs: Secondary | ICD-10-CM | POA: Diagnosis not present

## 2019-04-02 DIAGNOSIS — Z362 Encounter for other antenatal screening follow-up: Secondary | ICD-10-CM | POA: Diagnosis not present

## 2019-04-02 DIAGNOSIS — O09529 Supervision of elderly multigravida, unspecified trimester: Secondary | ICD-10-CM | POA: Diagnosis not present

## 2019-04-02 DIAGNOSIS — O0990 Supervision of high risk pregnancy, unspecified, unspecified trimester: Secondary | ICD-10-CM

## 2019-04-02 DIAGNOSIS — O99213 Obesity complicating pregnancy, third trimester: Secondary | ICD-10-CM | POA: Diagnosis not present

## 2019-04-02 IMAGING — US US MFM OB FOLLOW-UP
1 series · 14 of 28 positions shown · non-contrast
Comparison: none

[Series 1: us mfm ob follow-up · 35 acquisitions, 14 frames shown]
[im 2/35]
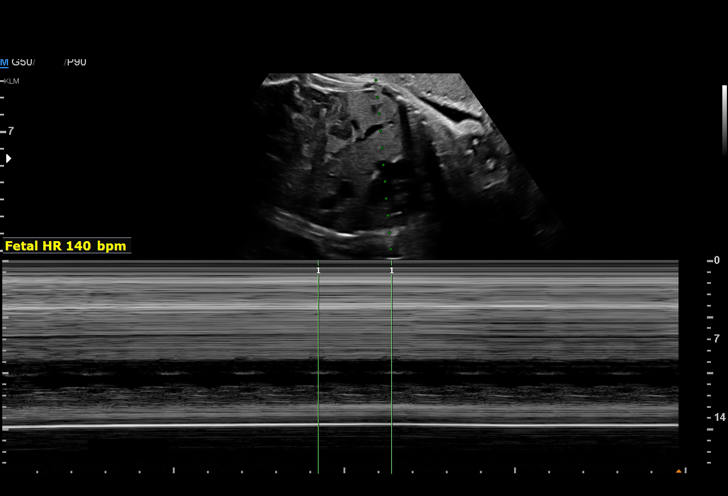
[im 4/35]
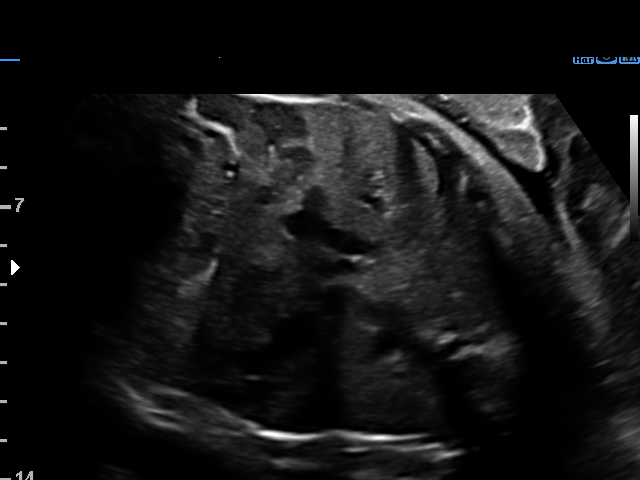
[im 7/35]
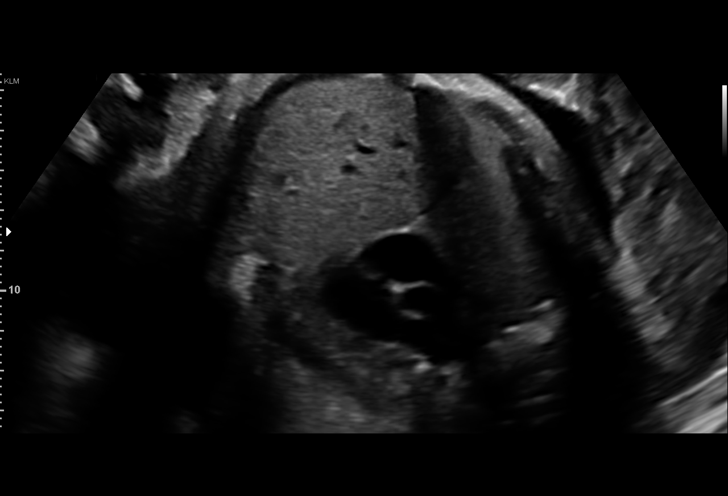
[im 9/35]
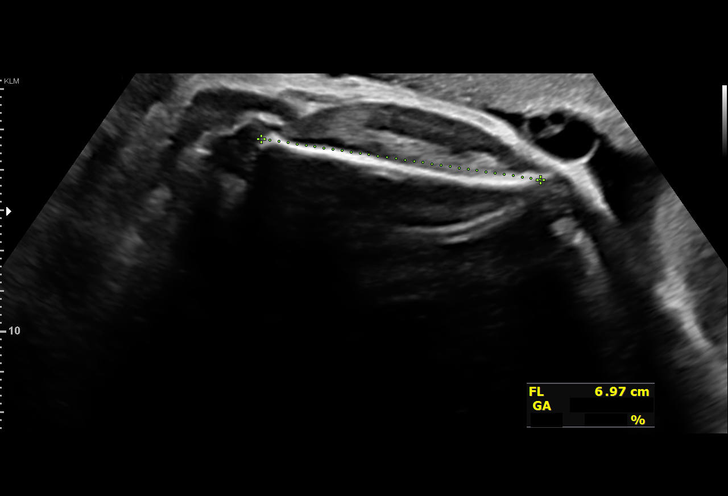
[im 12/35]
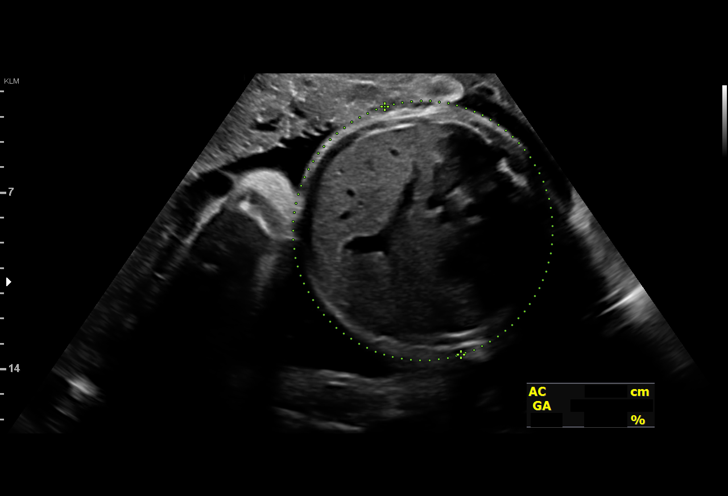
[im 14/35]
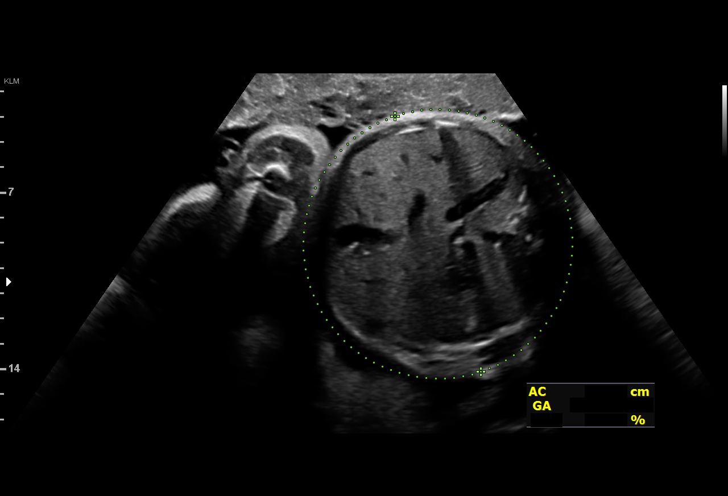
[im 17/35]
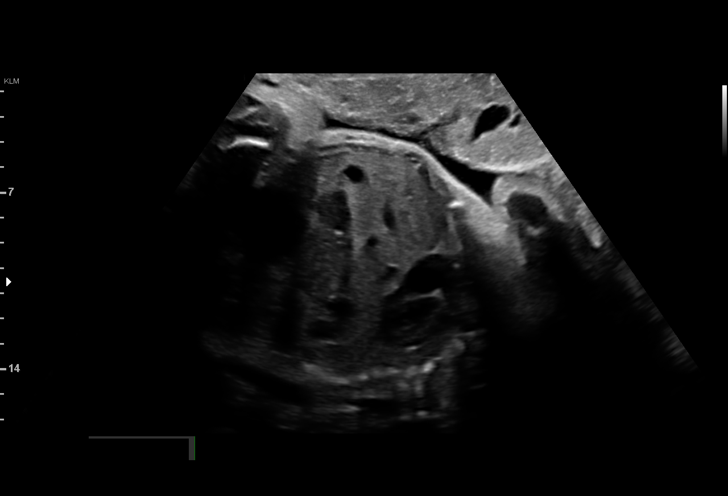
[im 19/35]
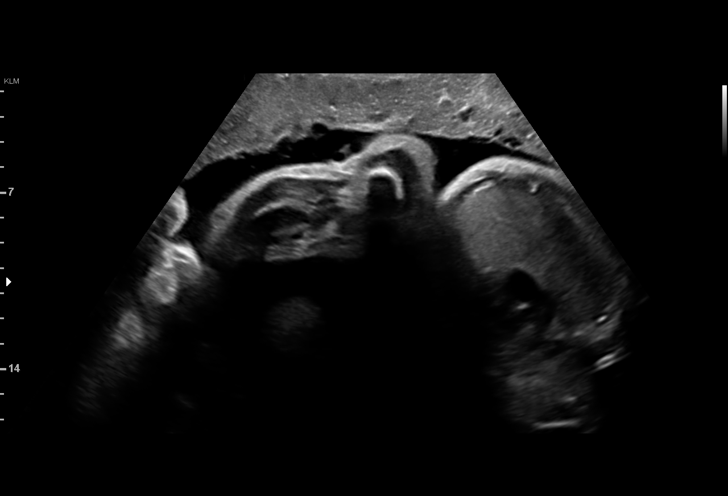
[im 22/35]
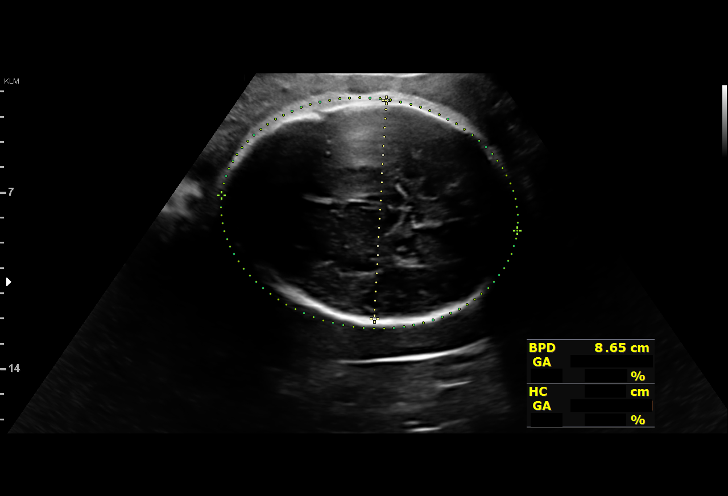
[im 24/35]
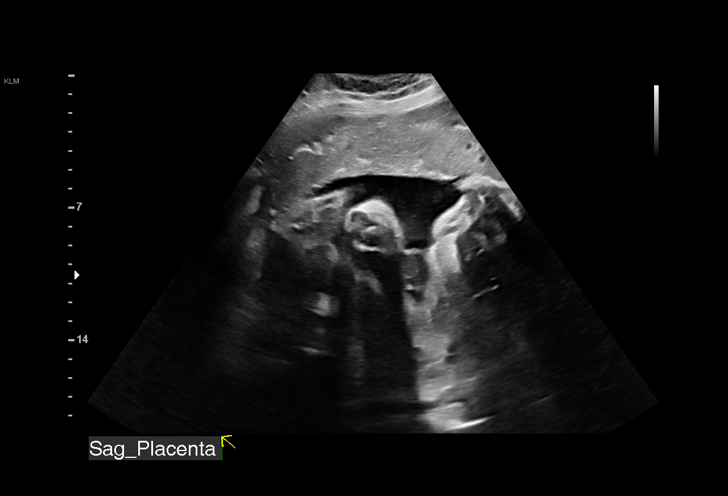
[im 27/35]
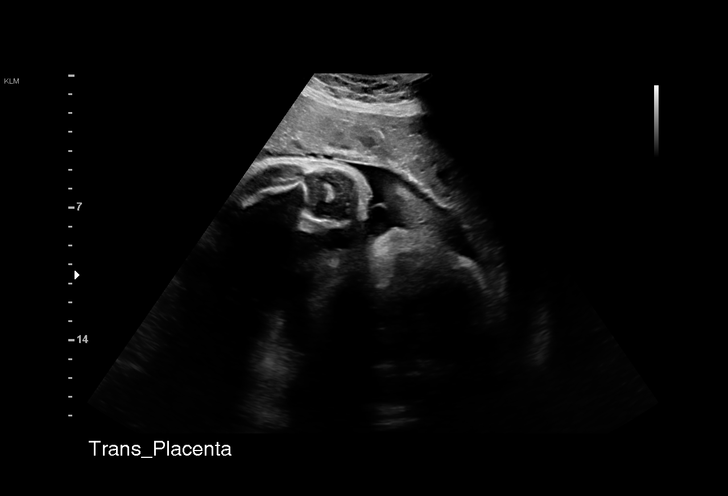
[im 29/35]
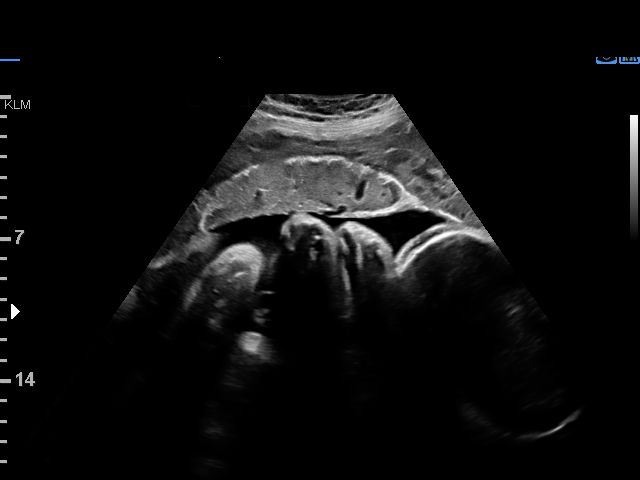
[im 32/35]
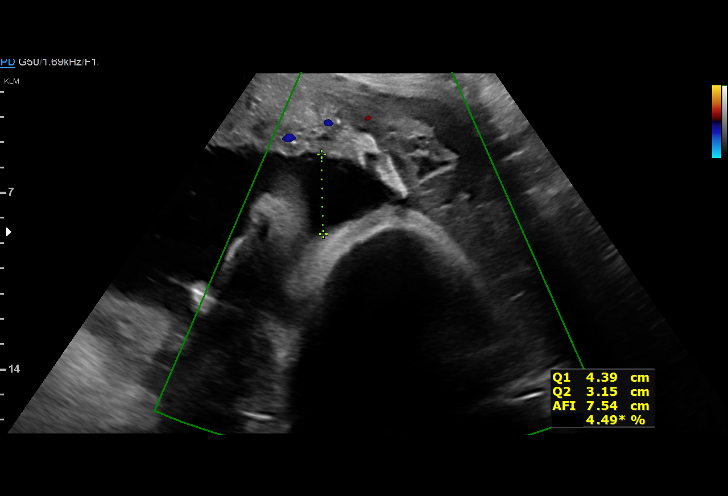
[im 35/35]
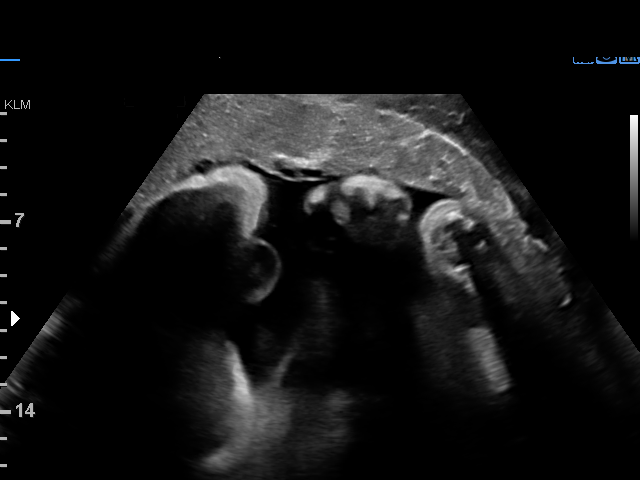

[14 of 28 positions shown; findings below may reference images not displayed]

----------------------------------------------------------------------

 ----------------------------------------------------------------------
Indications

  Gestational diabetes in pregnancy,             [UO]
  controlled by oral hypoglycemic drugs
  Advanced maternal age multigravida 35+,        [UO]
  third trimester (43)
  35 weeks gestation of pregnancy
  Obesity complicating pregnancy                 [UO] [UO]
  (PREGRAVIDA BMI 33)
  Low Risk NIPS
 ----------------------------------------------------------------------
Vital Signs

                                                Height:        5'4"
Fetal Evaluation

 Num Of Fetuses:         1
 Fetal Heart Rate(bpm):  140
 Cardiac Activity:       Observed
 Presentation:           Cephalic
 Placenta:               Anterior
 P. Cord Insertion:      Previously Visualized

 Amniotic Fluid
 AFI FV:      Within normal limits

 AFI Sum(cm)     %Tile       Largest Pocket(cm)
 14.7            53

 RUQ(cm)       RLQ(cm)       LUQ(cm)        LLQ(cm)

Biophysical Evaluation

 Amniotic F.V:   Within normal limits       F. Tone:        Observed
 F. Movement:    Observed                   Score:          [DATE]
 F. Breathing:   Observed
Biometry

 BPD:      86.7  mm     G. Age:  35w 0d         35  %    CI:        70.87   %    70 - 86
                                                         FL/HC:      21.0   %    20.1 -
 HC:      328.2  mm     G. Age:  37w 2d         58  %    HC/AC:      1.00        0.93 -
 AC:      327.3  mm     G. Age:  36w 4d         83  %    FL/BPD:     79.6   %    71 - 87
 FL:         69  mm     G. Age:  35w 3d         37  %    FL/AC:      21.1   %    20 - 24

 Est. FW:    [UO]  gm      6 lb 6 oz     64  %
OB History

 Gravidity:    4         Term:   1         SAB:   2
 Living:       1
Gestational Age

 LMP:           35w 6d        Date:  [DATE]                 EDD:   [DATE]
 U/S Today:     36w 1d                                        EDD:   [DATE]
 Best:          35w 5d     Det. By:  Previous Ultrasound      EDD:   [DATE]
                                     ([DATE])
Anatomy

 Cranium:               Appears normal         Aortic Arch:            Previously seen
 Cavum:                 Previously seen        Ductal Arch:            Not well visualized
 Ventricles:            Appears normal         Diaphragm:              Appears normal
 Choroid Plexus:        Previously seen        Stomach:                Appears normal, left
                                                                       sided
 Cerebellum:            Previously seen        Abdomen:                Appears normal
 Posterior Fossa:       Previously seen        Abdominal Wall:         Previously seen
 Nuchal Fold:           Previously seen        Cord Vessels:           Previously seen
 Face:                  Orbits and profile     Kidneys:                Appear normal
                        previously seen
 Lips:                  Previously seen        Bladder:                Appears normal
 Thoracic:              Appears normal         Spine:                  Previously seen
 Heart:                 Previously seen        Upper Extremities:      Previously seen
 RVOT:                  Appears normal         Lower Extremities:      Previously seen
 LVOT:                  Appears normal

 Other:  Technically difficult due to maternal habitus and fetal position.
Cervix Uterus Adnexa

 Cervix
 Not visualized (advanced GA >[UO])
Comments

 This patient was seen for a follow up growth scan due to A2
 gestational diabetes currently treated with Metformin and due
 to advanced maternal age.  The patient reports that her
 fingerstick values are within normal limits denies any
 problems since her last exam.
 She was informed that the fetal growth and amniotic fluid
 level appears appropriate for her gestational age.
 A biophysical profile performed today was [DATE].
 We will continue to follow her with weekly fetal testing until
 delivery.
 Another biophysical profile was scheduled in 1 week.

## 2019-04-02 MED ORDER — METFORMIN HCL 500 MG PO TABS: 500.0000 mg | ORAL_TABLET | Freq: Two times a day (BID) | ORAL | 1 refills | Status: DC

## 2019-04-02 NOTE — Progress Notes (Signed)
Taking metformin twice a day now

## 2019-04-02 NOTE — Progress Notes (Signed)
   PRENATAL VISIT NOTE  Subjective:  [name] is a 44 y.o. G4P1021 at [redacted]w[redacted]d being seen today for ongoing prenatal care.  She is currently monitored for the following issues for this high-risk pregnancy and has Depression, recurrent (HCC); MDD (major depressive disorder); Supervision of high risk pregnancy, antepartum; AMA (advanced maternal age) multigravida 71+; Obesity in pregnancy; GDM (gestational diabetes mellitus); BMI 30s; and ? History of herpes genitalis on their problem list.  Patient reports no complaints.  Contractions: Irritability. Vag. Bleeding: None.  Movement: Present. Denies leaking of fluid.   The following portions of the patient's history were reviewed and updated as appropriate: allergies, current medications, past family history, past medical history, past social history, past surgical history and problem list.   Objective:   Vitals:   04/02/19 1504  BP: 134/87  Pulse: 94  Weight: 222 lb (100.7 kg)    Fetal Status: Fetal Heart Rate (bpm): 138 Fundal Height: 36 cm Movement: Present  Presentation: Vertex  General:  Alert, oriented and cooperative. Patient is in no acute distress.  Skin: Skin is warm and dry. No rash noted.   Cardiovascular: Normal heart rate noted  Respiratory: Normal respiratory effort, no problems with respiration noted  Abdomen: Soft, gravid, appropriate for gestational age.  Pain/Pressure: Present     Pelvic: Cervical exam performed Dilation: 1 Effacement (%): 20 Station: -3  Extremities: Normal range of motion.  Edema: Mild pitting, slight indentation  Mental Status: Normal mood and affect. Normal behavior. Normal judgment and thought content.   Assessment and Plan:  Pregnancy: G4P1021 at [redacted]w[redacted]d 1. Gestational diabetes mellitus (GDM) in third trimester controlled on oral hypoglycemic drug Increased to bid due to increasing postprandials. In weekly testing with MFM Last u/s for growth today 64% - metFORMIN (GLUCOPHAGE) 500 MG  tablet; Take 1 tablet (500 mg total) by mouth 2 (two) times daily with a meal.  Dispense: 60 tablet; Refill: 1  2. Multigravida of advanced maternal age in third trimester nml NIPT  3. Supervision of high risk pregnancy, antepartum Cultures today - Strep Gp B NAA - GC/Chlamydia probe amp ([place])not at [facility]  Preterm labor symptoms and general obstetric precautions including but not limited to vaginal bleeding, contractions, leaking of fluid and fetal movement were reviewed in detail with the patient. Please refer to After Visit Summary for other counseling recommendations.   Return in 1 week (on 04/09/2019) for virtual.  Future Appointments  Date Time Provider Department Center  04/09/2019 12:45 PM [facility] [place] 5 [facility]  04/16/2019  9:15 AM Reva Bores, MD [facility]  04/16/2019 10:45 AM [facility] [place] 2 [facility]    Reva Bores, MD

## 2019-04-02 NOTE — Patient Instructions (Signed)

## 2019-04-04 LAB — STREP GP B NAA: Strep Gp B NAA: POSITIVE — AB

## 2019-04-05 ENCOUNTER — Encounter: Payer: Self-pay | Admitting: Family Medicine

## 2019-04-05 DIAGNOSIS — O99820 Streptococcus B carrier state complicating pregnancy: Secondary | ICD-10-CM | POA: Insufficient documentation

## 2019-04-06 ENCOUNTER — Encounter: Payer: Self-pay | Admitting: *Deleted

## 2019-04-06 LAB — GC/CHLAMYDIA PROBE AMP (~~LOC~~) NOT AT ARMC
Chlamydia: NEGATIVE
Comment: NEGATIVE
Comment: NORMAL
Neisseria Gonorrhea: NEGATIVE

## 2019-04-07 DIAGNOSIS — M9905 Segmental and somatic dysfunction of pelvic region: Secondary | ICD-10-CM | POA: Diagnosis not present

## 2019-04-07 DIAGNOSIS — M62830 Muscle spasm of back: Secondary | ICD-10-CM | POA: Diagnosis not present

## 2019-04-07 DIAGNOSIS — M5416 Radiculopathy, lumbar region: Secondary | ICD-10-CM | POA: Diagnosis not present

## 2019-04-07 DIAGNOSIS — M9903 Segmental and somatic dysfunction of lumbar region: Secondary | ICD-10-CM | POA: Diagnosis not present

## 2019-04-08 ENCOUNTER — Encounter (HOSPITAL_COMMUNITY): Payer: Self-pay | Admitting: Obstetrics & Gynecology

## 2019-04-08 ENCOUNTER — Other Ambulatory Visit: Payer: Self-pay

## 2019-04-08 ENCOUNTER — Inpatient Hospital Stay (HOSPITAL_COMMUNITY)
Admission: AD | Admit: 2019-04-08 | Discharge: 2019-04-08 | Disposition: A | Discharge status: Home / Self Care | Payer: BC Managed Care – PPO | Attending: Obstetrics & Gynecology | Admitting: Obstetrics & Gynecology

## 2019-04-08 DIAGNOSIS — Z7984 Long term (current) use of oral hypoglycemic drugs: Secondary | ICD-10-CM | POA: Insufficient documentation

## 2019-04-08 DIAGNOSIS — O163 Unspecified maternal hypertension, third trimester: Secondary | ICD-10-CM | POA: Diagnosis not present

## 2019-04-08 DIAGNOSIS — Z7982 Long term (current) use of aspirin: Secondary | ICD-10-CM | POA: Diagnosis not present

## 2019-04-08 DIAGNOSIS — Z3A36 36 weeks gestation of pregnancy: Secondary | ICD-10-CM | POA: Diagnosis not present

## 2019-04-08 DIAGNOSIS — O4703 False labor before 37 completed weeks of gestation, third trimester: Secondary | ICD-10-CM | POA: Diagnosis not present

## 2019-04-08 DIAGNOSIS — Z8249 Family history of ischemic heart disease and other diseases of the circulatory system: Secondary | ICD-10-CM | POA: Insufficient documentation

## 2019-04-08 DIAGNOSIS — O368130 Decreased fetal movements, third trimester, not applicable or unspecified: Secondary | ICD-10-CM | POA: Insufficient documentation

## 2019-04-08 DIAGNOSIS — O99820 Streptococcus B carrier state complicating pregnancy: Secondary | ICD-10-CM

## 2019-04-08 DIAGNOSIS — Z3689 Encounter for other specified antenatal screening: Secondary | ICD-10-CM

## 2019-04-08 LAB — URINALYSIS, ROUTINE W REFLEX MICROSCOPIC
Bilirubin Urine: NEGATIVE
Glucose, UA: NEGATIVE mg/dL
Hgb urine dipstick: NEGATIVE
Ketones, ur: NEGATIVE mg/dL
Nitrite: NEGATIVE
Protein, ur: NEGATIVE mg/dL
Specific Gravity, Urine: 1.008 (ref 1.005–1.030)
pH: 7 (ref 5.0–8.0)

## 2019-04-08 NOTE — Discharge Instructions (Signed)

## 2019-04-08 NOTE — MAU Note (Signed)
Wynelle Bourgeois CNM aware of diastolic b/ps in 84Q.

## 2019-04-08 NOTE — MAU Note (Signed)
Baby is moving some and pt does not feel FM.

## 2019-04-08 NOTE — Progress Notes (Signed)
Wynelle Bourgeois CNM in to see pt and discuss d/c plan. Written and verbal d/c instructions given and understanding voiced. Discussed kick counts and voices understanding. Feels reassured after feeling baby move more during visit.

## 2019-04-08 NOTE — MAU Note (Signed)
For last 10 days baby's movements have not been as strong. Last time movements were like normal was [employer]. She moves but not as strong and just seems to be getting slower. Have mild abd cramping. Denies VB or LOF. Increase in vag d/c that is white

## 2019-04-08 NOTE — MAU Provider Note (Signed)
Chief Complaint:  Decreased Fetal Movement and Abdominal Pain   First Provider Initiated Contact with Patient 04/08/19 2039     HPI: [name] is a 44 y.o. D3T7017 at 34w5dho presents to maternity admissions reporting decreased fetal movement.  Have been decreased all week.  Having a few mild contractions, irregularly. . She reports good fetal movement, denies LOF, vaginal bleeding, vaginal itching/burning, urinary symptoms, h/a, dizziness, n/v, diarrhea, constipation or fever/chills.  She denies headache, visual changes or RUQ abdominal pain.  Abdominal Pain This is a new problem. The current episode started in the past 7 days. The onset quality is gradual. The problem occurs intermittently. The problem has been unchanged. The quality of the pain is cramping. The abdominal pain does not radiate. Pertinent negatives include no constipation, diarrhea, dysuria, fever, nausea or vomiting. Associated symptoms comments: Decreased fetal movement. Nothing aggravates the pain. The pain is relieved by nothing. She has tried nothing for the symptoms.   RN Note: For last 10 days baby's movements have not been as strong. Last time movements were like normal was [employer] She moves but not as strong and just seems to be getting slower. Have mild abd cramping. Denies VB or LOF. Increase in vag d/c that is white  Past Medical History: Past Medical History:  Diagnosis Date  . Depression   . Diverticulosis 2018  . Frequent headaches   . Gestational diabetes   . Heart murmur   . History of chicken pox   . Hypertension     Past obstetric history: OB History  Gravida Para Term Preterm AB Living  '4 1 1   2 1  ' SAB TAB Ectopic Multiple Live Births  2       1    # Outcome Date GA Lbr Len/2nd Weight Sex Delivery Anes PTL Lv  4 Current           3 SAB 07/2018 824w0d       2 Term 11/30/03 [id number] g M Vag-Spont   LIV  1 SAB             Obstetric Comments  1st Menstrual Cycle:  13    1st Pregnancy:  27    Past Surgical History: Past Surgical History:  Procedure Laterality Date  . COLONOSCOPY WITH PROPOFOL N/A 11/28/2016   Procedure: COLONOSCOPY WITH PROPOFOL;  Surgeon: ByrRobert BellowD;  Location: [facility] ENDOSCOPY;  Service: Endoscopy;  Laterality: N/A;  . EXCISIONAL HEMORRHOIDECTOMY  2014    Family History: Family History  Problem Relation Age of Onset  . Heart disease Mother        Unsure    Social History: Social History   Tobacco Use  . Smoking status: Never Smoker  . Smokeless tobacco: Never Used  Substance Use Topics  . Alcohol use: Not Currently  . Drug use: Never    Allergies: No Known Allergies  Meds:  Medications Prior to Admission  Medication Sig Dispense Refill Last Dose  . Accu-Chek FastClix Lancets MISC 1 Units by Percutaneous route 4 (four) times daily. 100 each 12   . aspirin EC 81 MG tablet Take 2 tablets (162 mg total) by mouth daily. Take after 12 weeks for prevention of preeclampsia later in pregnancy 300 tablet 2   . Blood Glucose Monitoring Suppl (ACCU-CHEK GUIDE) w/Device KIT 1 Device by Does not apply route 4 (four) times daily. 1 kit 0   . Calcium Polycarbophil (FIBER-CAPS PO) Take 2 capsules by  mouth daily.     Marland Kitchen FOLIC ACID PO Take 1 tablet by mouth daily.     Marland Kitchen glucose blood (ACCU-CHEK GUIDE) test strip Use to check blood sugars four times a day was instructed 50 each 12   . metFORMIN (GLUCOPHAGE) 500 MG tablet Take 1 tablet (500 mg total) by mouth 2 (two) times daily with a meal. 60 tablet 1   . Misc. Devices (BREAST PUMP) MISC Dispense one breast pump for patient 1 each 0   . Prenatal Vit-Fe Fumarate-FA (MULTIVITAMIN-PRENATAL) 27-0.8 MG TABS tablet Take 1 tablet by mouth daily at 12 noon.     . valACYclovir (VALTREX) 1000 MG tablet Take 1 tablet (1,000 mg total) by mouth daily. 30 tablet 3     I have reviewed patient's Past Medical Hx, Surgical Hx, Family Hx, Social Hx, medications and allergies.   ROS:  Review  of Systems  Constitutional: Negative for fever.  Gastrointestinal: Positive for abdominal pain. Negative for constipation, diarrhea, nausea and vomiting.  Genitourinary: Negative for dysuria.   Other systems negative  Physical Exam   Patient Vitals for the past 24 hrs:  BP Temp Pulse Resp Height Weight  04/08/19 2012 135/79 -- 91 -- -- --  04/08/19 2010 -- 98.4 F (36.9 C) -- 18 '5\' 4"'  (1.626 m) 100.7 kg   Constitutional: Well-developed, well-nourished female in no acute distress.  Cardiovascular: normal rate and rhythm Respiratory: normal effort, clear to auscultation bilaterally GI: Abd soft, non-tender, gravid appropriate for gestational age.   No rebound or guarding. MS: Extremities nontender, no edema, normal ROM Neurologic: Alert and oriented x 4.  GU: Neg CVAT.  PELVIC EXAM:  deferred  FHT:  Baseline 140 , moderate variability, accelerations present, no decelerations Contractions:  Irregular     Labs: No results found for this or any previous visit (from the past 24 hour(s)).  A/Positive/-- (08/20 1048)  Imaging:    MAU Course/MDM: NST reviewed and has been reactive throughout Reassured about tracing Decreased perception may be related to uterine irritability Treatments in MAU included EFM.    Assessment: Single intrauterine pregnancy at 30w5dDecreased fetal movement Reactive fetal heart rate tracing Irregular Braxton-Hicks contractions  Plan: Discharge home Labor precautions and fetal kick counts Follow up in Office for prenatal visits   Encouraged to return here or to other Urgent Care/ED if she develops worsening of symptoms, increase in pain, fever, or other concerning symptoms.  Pt stable at time of discharge.  MHansel FeinsteinCNM, MSN Certified Nurse-Midwife 04/08/2019 8:39 PM

## 2019-04-09 ENCOUNTER — Ambulatory Visit (HOSPITAL_COMMUNITY): Payer: BC Managed Care – PPO

## 2019-04-09 ENCOUNTER — Inpatient Hospital Stay (HOSPITAL_COMMUNITY): Admission: RE | Admit: 2019-04-09 | Payer: BC Managed Care – PPO | Source: Ambulatory Visit

## 2019-04-09 DIAGNOSIS — F411 Generalized anxiety disorder: Secondary | ICD-10-CM | POA: Diagnosis not present

## 2019-04-10 ENCOUNTER — Ambulatory Visit (HOSPITAL_COMMUNITY)
Admission: RE | Admit: 2019-04-10 | Discharge: 2019-04-10 | Disposition: A | Discharge status: Home / Self Care | Payer: BC Managed Care – PPO | Source: Ambulatory Visit | Attending: Obstetrics and Gynecology | Admitting: Obstetrics and Gynecology

## 2019-04-10 ENCOUNTER — Other Ambulatory Visit: Payer: Self-pay

## 2019-04-10 ENCOUNTER — Other Ambulatory Visit (HOSPITAL_COMMUNITY): Payer: BC Managed Care – PPO

## 2019-04-10 ENCOUNTER — Encounter (HOSPITAL_COMMUNITY): Payer: Self-pay

## 2019-04-10 ENCOUNTER — Ambulatory Visit (HOSPITAL_COMMUNITY): Payer: BC Managed Care – PPO

## 2019-04-10 ENCOUNTER — Ambulatory Visit (HOSPITAL_COMMUNITY): Payer: BC Managed Care – PPO | Admitting: *Deleted

## 2019-04-10 DIAGNOSIS — O09523 Supervision of elderly multigravida, third trimester: Secondary | ICD-10-CM

## 2019-04-10 DIAGNOSIS — O99820 Streptococcus B carrier state complicating pregnancy: Secondary | ICD-10-CM | POA: Diagnosis not present

## 2019-04-10 DIAGNOSIS — O09529 Supervision of elderly multigravida, unspecified trimester: Secondary | ICD-10-CM | POA: Diagnosis not present

## 2019-04-10 DIAGNOSIS — Z3A37 37 weeks gestation of pregnancy: Secondary | ICD-10-CM

## 2019-04-10 DIAGNOSIS — O24415 Gestational diabetes mellitus in pregnancy, controlled by oral hypoglycemic drugs: Secondary | ICD-10-CM | POA: Diagnosis not present

## 2019-04-10 IMAGING — US US MFM FETAL BPP W/O NON-STRESS
1 series · 12 of 25 positions shown · non-contrast
Comparison: none

[Series 1: us mfm fetal bpp w/o non-stress · 25 acquisitions, 12 frames shown]
[im 2/25]
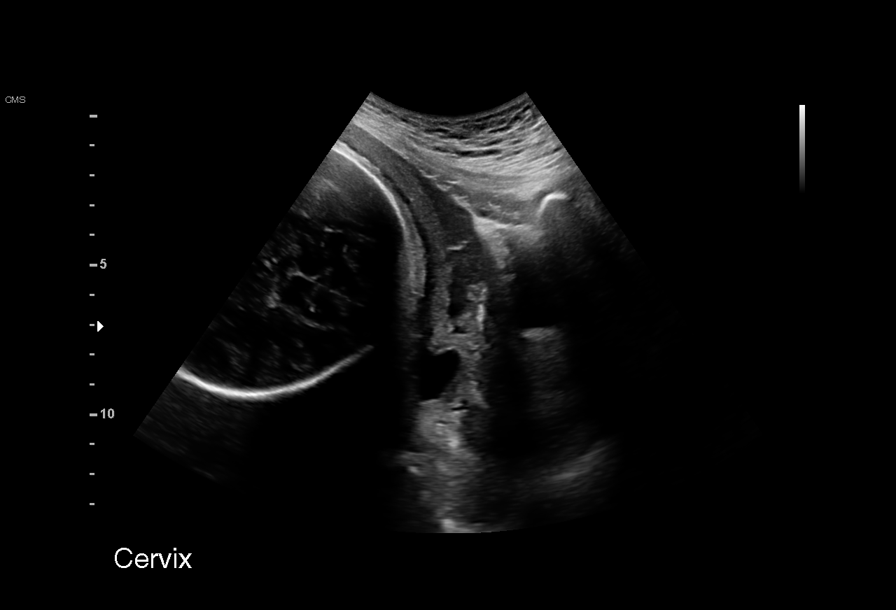
[im 4/25]
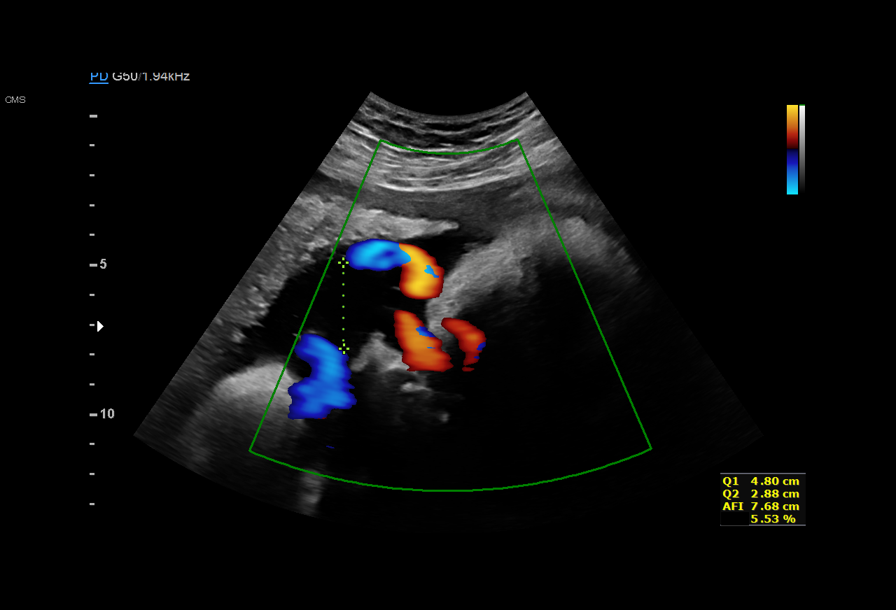
[im 6/25]
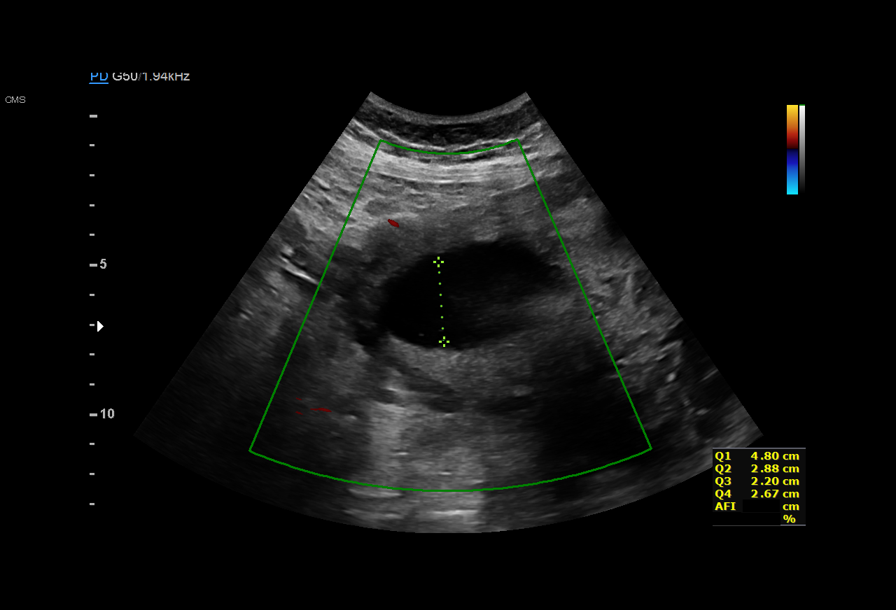
[im 8/25]
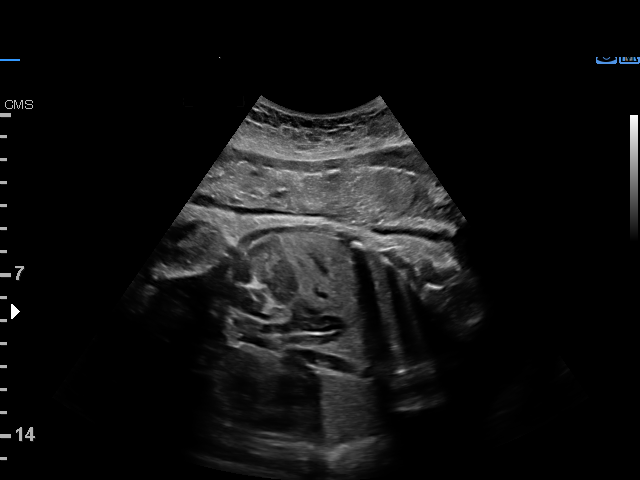
[im 10/25]
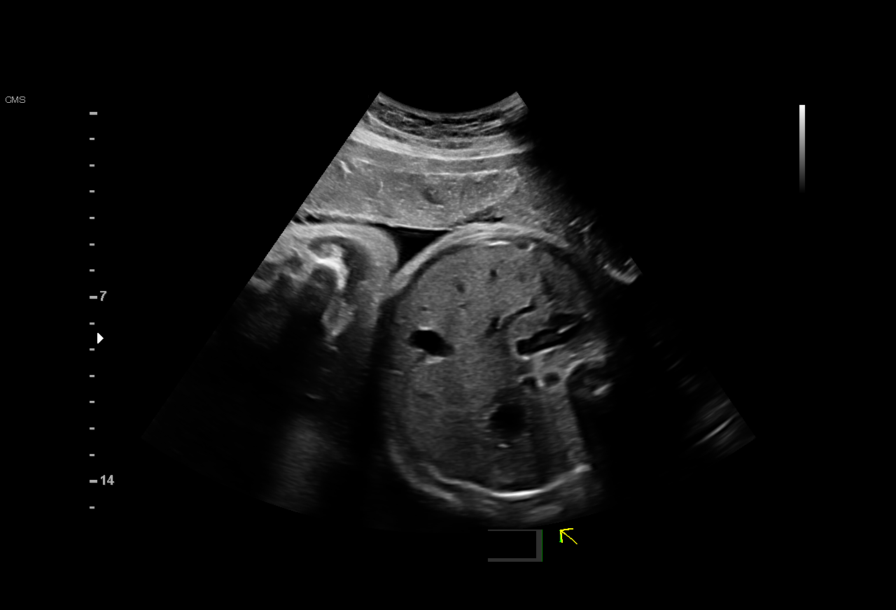
[im 12/25]
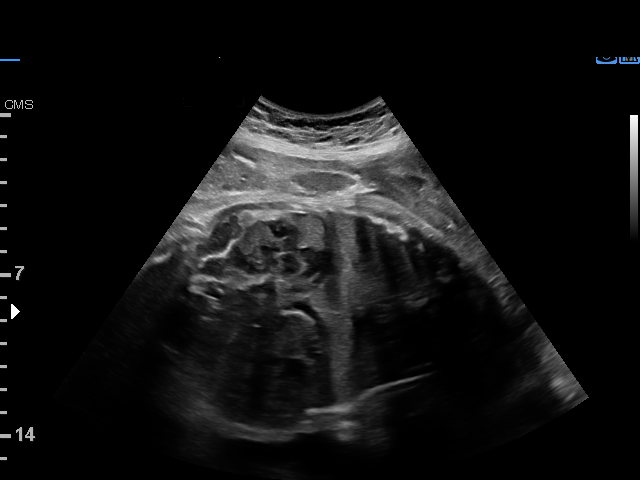
[im 14/25]
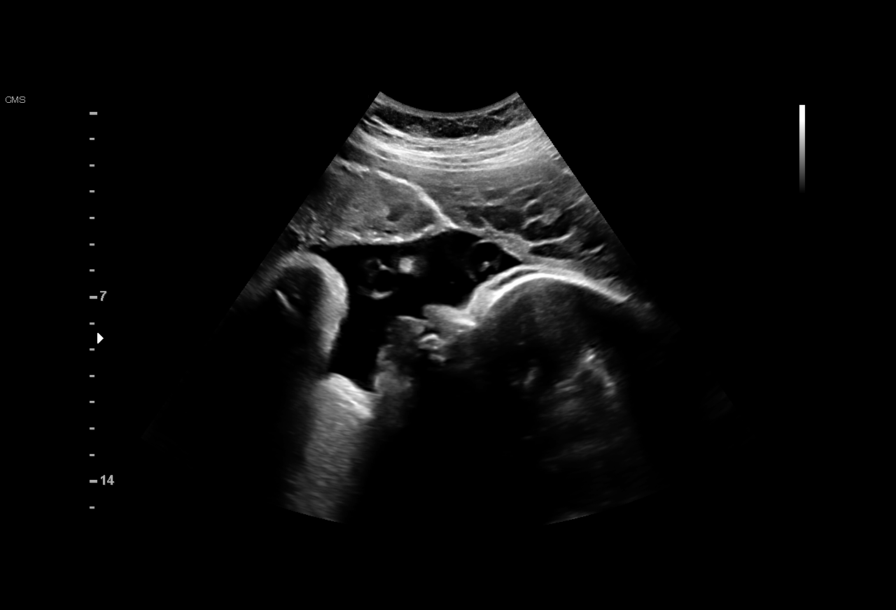
[im 16/25]
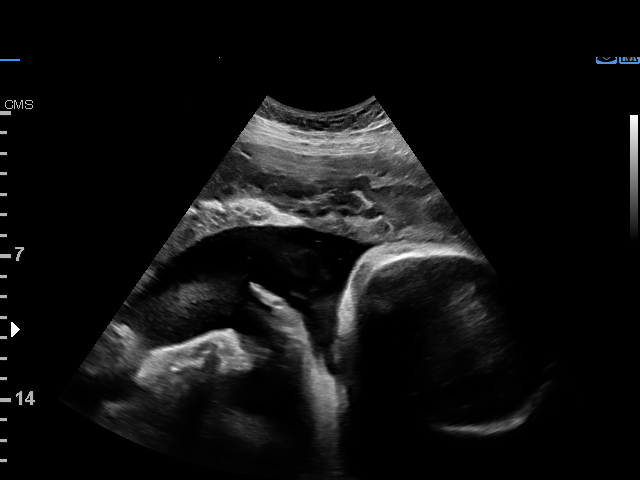
[im 18/25]
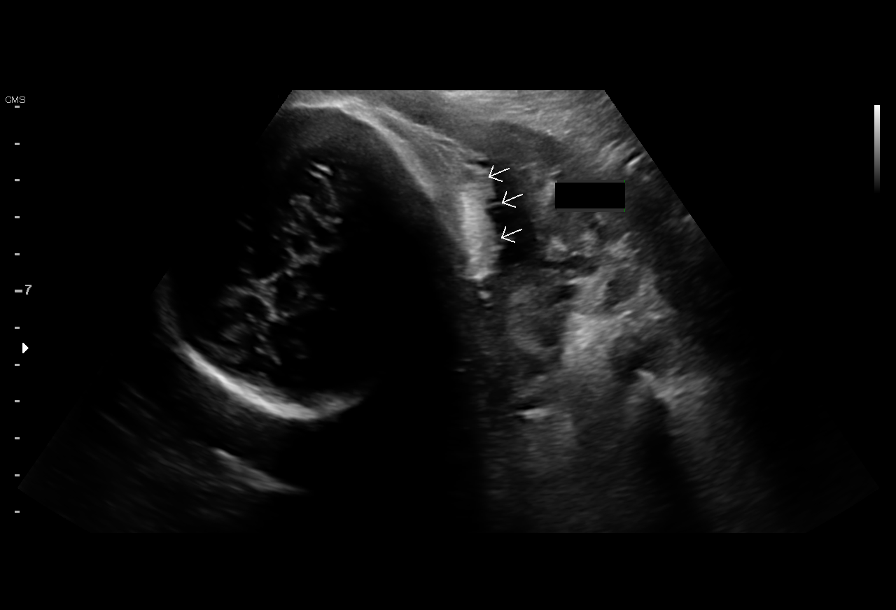
[im 20/25]
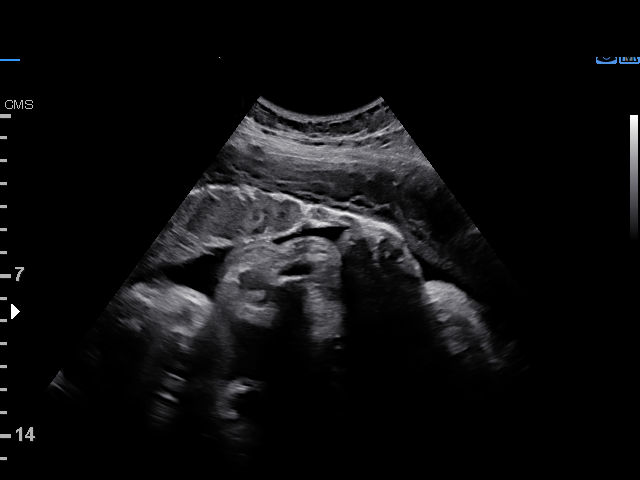
[im 22/25]
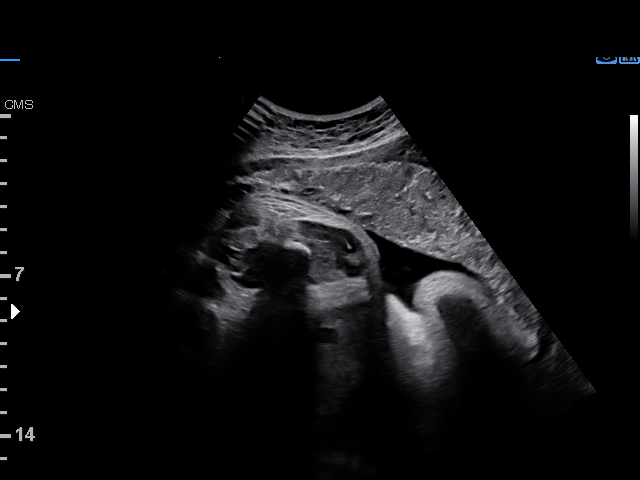
[im 24/25]
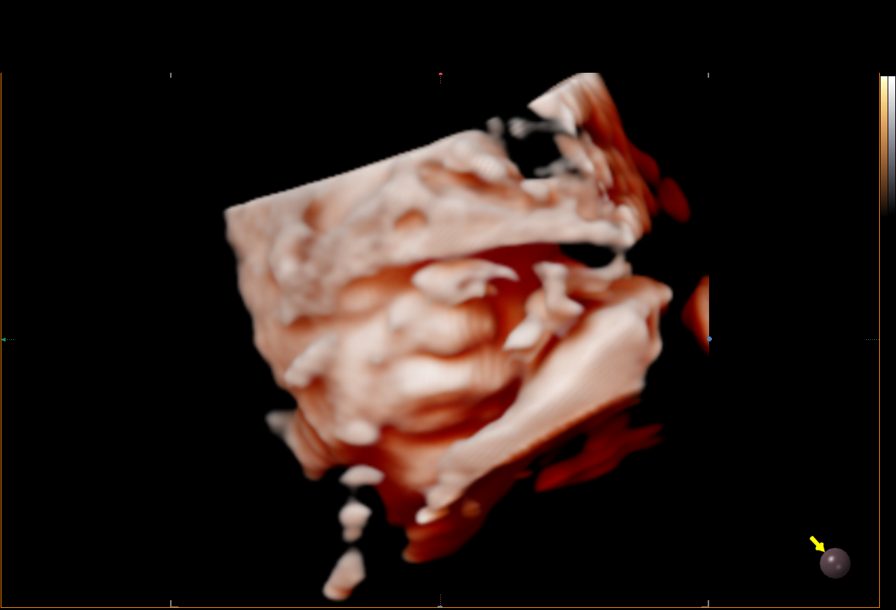

[12 of 25 positions shown; findings below may reference images not displayed]

----------------------------------------------------------------------

 ----------------------------------------------------------------------
Indications

  Gestational diabetes in pregnancy,             [V0]
  controlled by oral hypoglycemic drugs
  Advanced maternal age multigravida 35+,        [V0]
  third trimester (43)
  Obesity complicating pregnancy (pregravid      [V0] [V0]
  BMI 33)
  Low Risk NIPS
  37 weeks gestation of pregnancy
 ----------------------------------------------------------------------
Vital Signs

                                                Height:        5'4"
Fetal Evaluation

 Num Of Fetuses:         1
 Fetal Heart Rate(bpm):  154
 Cardiac Activity:       Observed
 Presentation:           Cephalic

 Amniotic Fluid
 AFI FV:      Within normal limits

 AFI Sum(cm)     %Tile       Largest Pocket(cm)
 12.55           43

 RUQ(cm)       RLQ(cm)       LUQ(cm)        LLQ(cm)

Biophysical Evaluation

 Amniotic F.V:   Within normal limits       F. Tone:        Observed
 F. Movement:    Observed                   Score:          [DATE]
 F. Breathing:   Observed
OB History

 Gravidity:    4         Term:   1         SAB:   2
 Living:       1
Gestational Age

 LMP:           37w 0d        Date:  [DATE]                 EDD:   [DATE]
 Best:          37w 0d     Det. By:  LMP  ([DATE])          EDD:   [DATE]
Comments

 A biophysical profile performed today due to A2 gestational
 diabetes that is currently controlled with Metformin was [DATE]
 There was normal amniotic fluid noted on today's ultrasound
 exam.
 Another biophysical profile was scheduled in 1 week.

## 2019-04-13 ENCOUNTER — Encounter (HOSPITAL_COMMUNITY): Payer: Self-pay | Admitting: *Deleted

## 2019-04-13 ENCOUNTER — Telehealth (HOSPITAL_COMMUNITY): Payer: Self-pay | Admitting: *Deleted

## 2019-04-13 NOTE — Telephone Encounter (Signed)
Explained to pt that we can assist her support person to the room via [facility] but we cannot do anything else to care for or assist him.  He will need to be able to care for himself.  He can order meals from the cafeteria by paying when he orders.  Pt stated understanding.

## 2019-04-13 NOTE — Telephone Encounter (Signed)
Preadmission screen  

## 2019-04-14 ENCOUNTER — Other Ambulatory Visit (HOSPITAL_COMMUNITY): Payer: Self-pay | Admitting: *Deleted

## 2019-04-14 DIAGNOSIS — M9905 Segmental and somatic dysfunction of pelvic region: Secondary | ICD-10-CM | POA: Diagnosis not present

## 2019-04-14 DIAGNOSIS — M5416 Radiculopathy, lumbar region: Secondary | ICD-10-CM | POA: Diagnosis not present

## 2019-04-14 DIAGNOSIS — M62830 Muscle spasm of back: Secondary | ICD-10-CM | POA: Diagnosis not present

## 2019-04-14 DIAGNOSIS — M9903 Segmental and somatic dysfunction of lumbar region: Secondary | ICD-10-CM | POA: Diagnosis not present

## 2019-04-14 DIAGNOSIS — O24415 Gestational diabetes mellitus in pregnancy, controlled by oral hypoglycemic drugs: Secondary | ICD-10-CM

## 2019-04-16 ENCOUNTER — Encounter (HOSPITAL_COMMUNITY): Payer: Self-pay

## 2019-04-16 ENCOUNTER — Ambulatory Visit (HOSPITAL_COMMUNITY): Payer: BC Managed Care – PPO | Admitting: *Deleted

## 2019-04-16 ENCOUNTER — Ambulatory Visit (HOSPITAL_COMMUNITY)
Admission: RE | Admit: 2019-04-16 | Discharge: 2019-04-16 | Disposition: A | Discharge status: Home / Self Care | Payer: BC Managed Care – PPO | Source: Ambulatory Visit | Attending: Obstetrics | Admitting: Obstetrics

## 2019-04-16 ENCOUNTER — Telehealth (INDEPENDENT_AMBULATORY_CARE_PROVIDER_SITE_OTHER): Payer: BC Managed Care – PPO | Admitting: Family Medicine

## 2019-04-16 ENCOUNTER — Other Ambulatory Visit (HOSPITAL_COMMUNITY): Payer: Self-pay | Admitting: *Deleted

## 2019-04-16 ENCOUNTER — Other Ambulatory Visit: Payer: Self-pay

## 2019-04-16 VITALS — BP 121/83 | HR 96 | Temp 97.4°F | Wt 220.4 lb

## 2019-04-16 DIAGNOSIS — O24415 Gestational diabetes mellitus in pregnancy, controlled by oral hypoglycemic drugs: Secondary | ICD-10-CM

## 2019-04-16 DIAGNOSIS — O99820 Streptococcus B carrier state complicating pregnancy: Secondary | ICD-10-CM

## 2019-04-16 DIAGNOSIS — Z3A37 37 weeks gestation of pregnancy: Secondary | ICD-10-CM

## 2019-04-16 DIAGNOSIS — O09523 Supervision of elderly multigravida, third trimester: Secondary | ICD-10-CM

## 2019-04-16 DIAGNOSIS — O0990 Supervision of high risk pregnancy, unspecified, unspecified trimester: Secondary | ICD-10-CM

## 2019-04-16 IMAGING — US US MFM FETAL BPP W/O NON-STRESS
1 series · 12 of 20 positions shown · non-contrast
Comparison: none

[Series 1: us mfm fetal bpp w/o non-stress · 20 acquisitions, 12 frames shown]
[im 1/20]
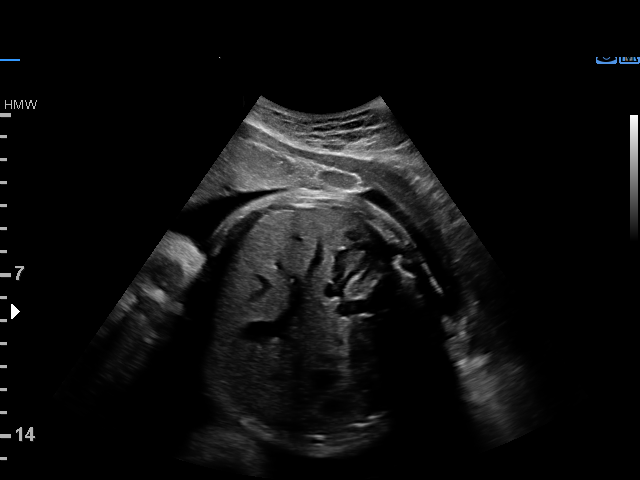
[im 3/20]
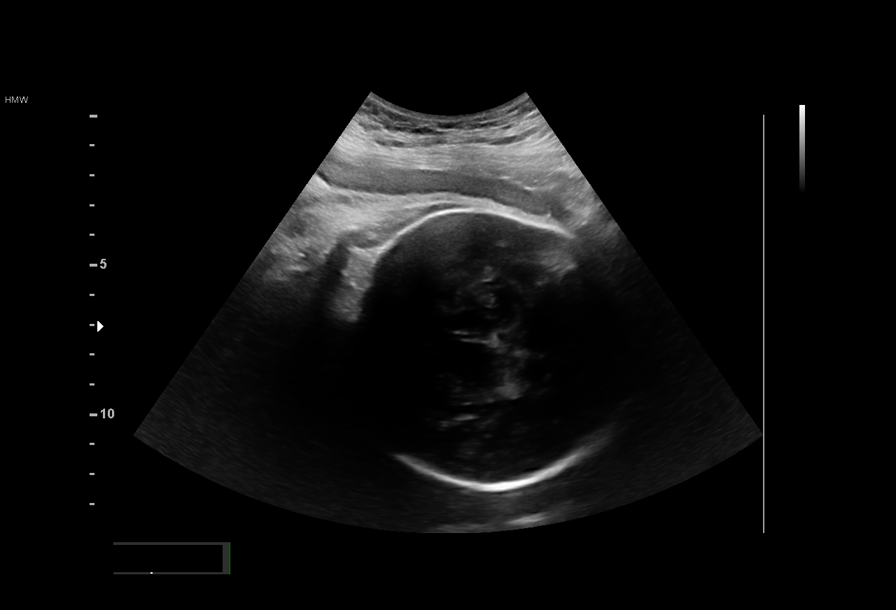
[im 5/20]
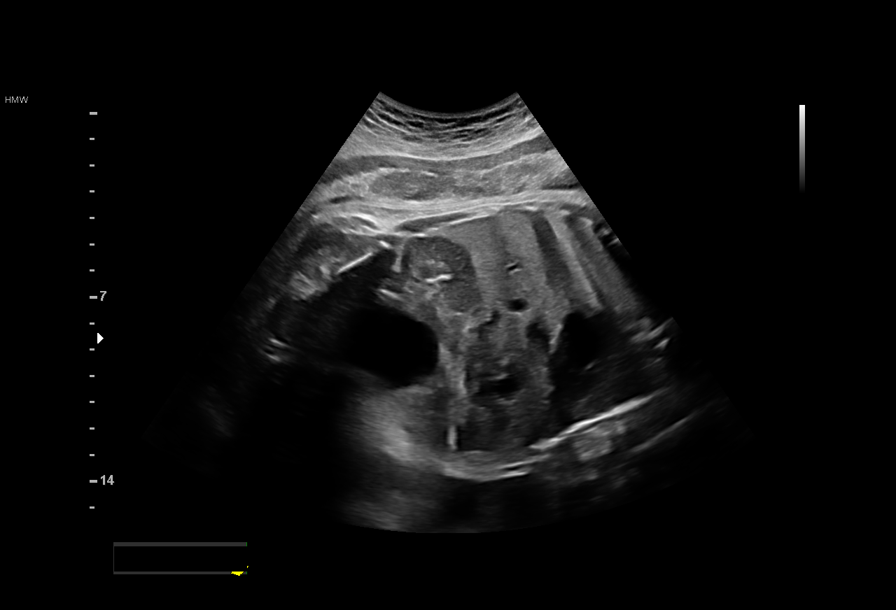
[im 6/20]
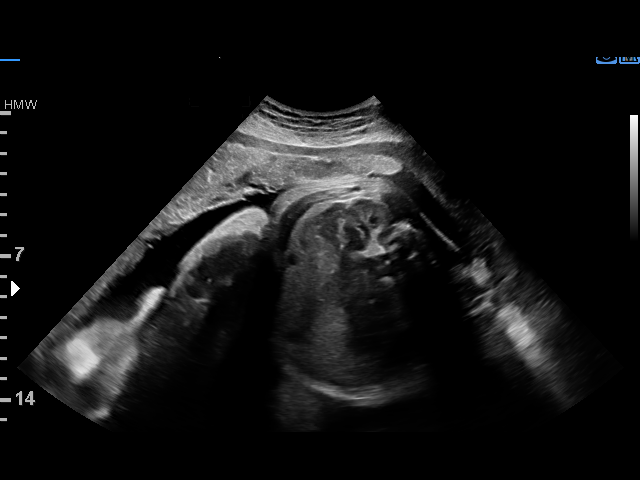
[im 8/20]
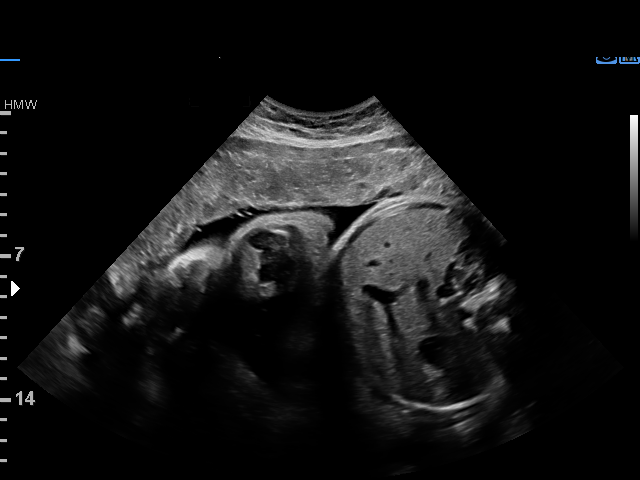
[im 10/20]
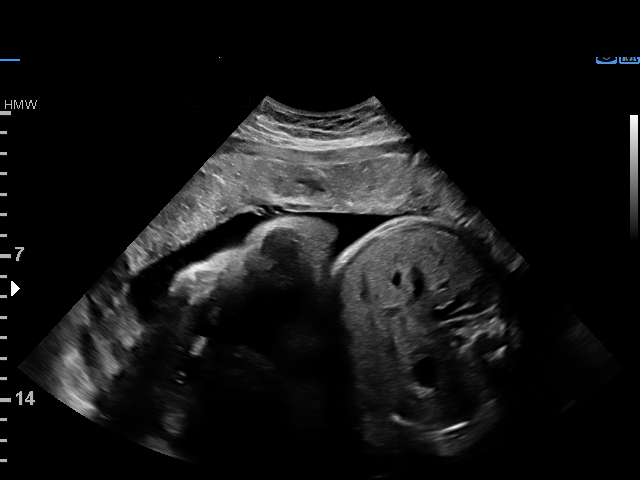
[im 11/20]
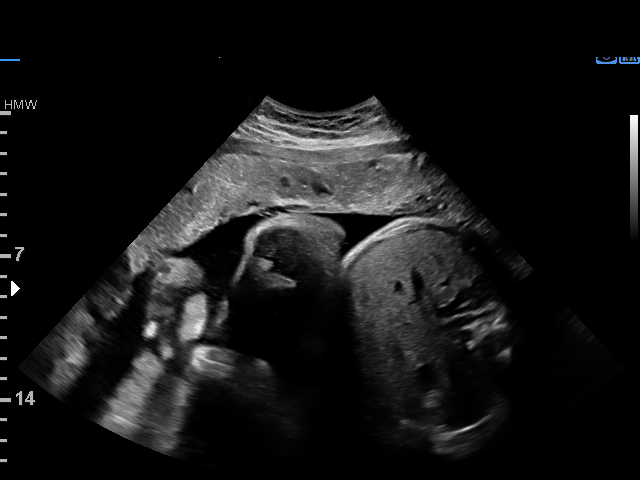
[im 13/20]
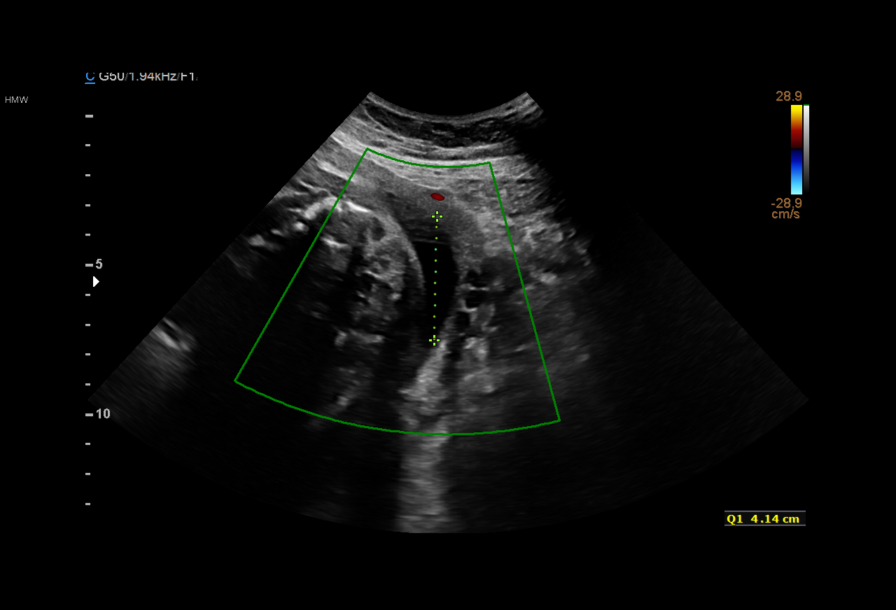
[im 15/20]
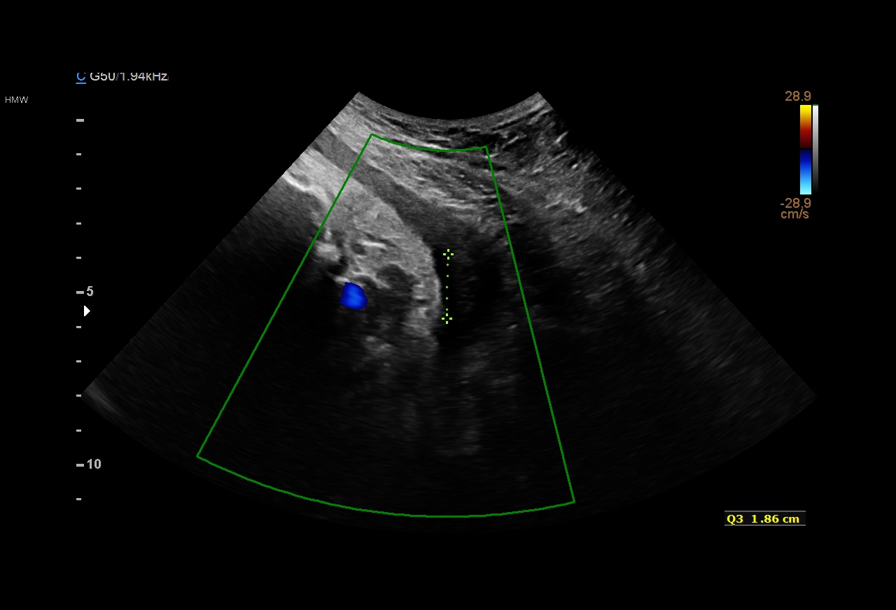
[im 16/20]
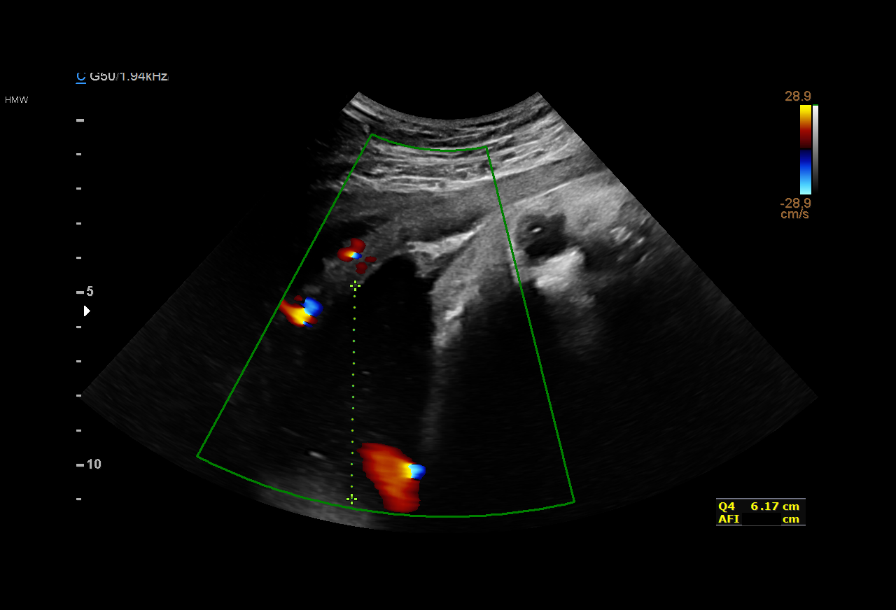
[im 18/20]
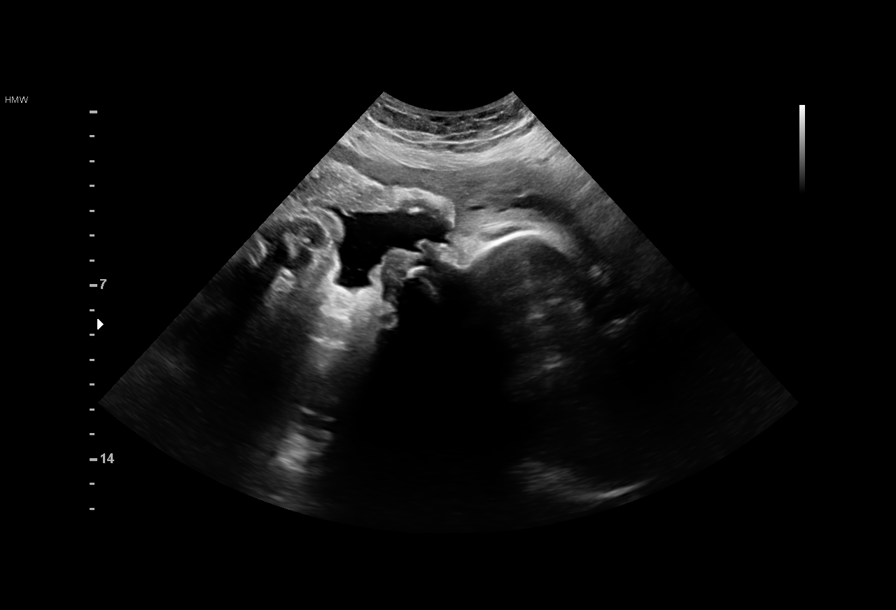
[im 20/20]
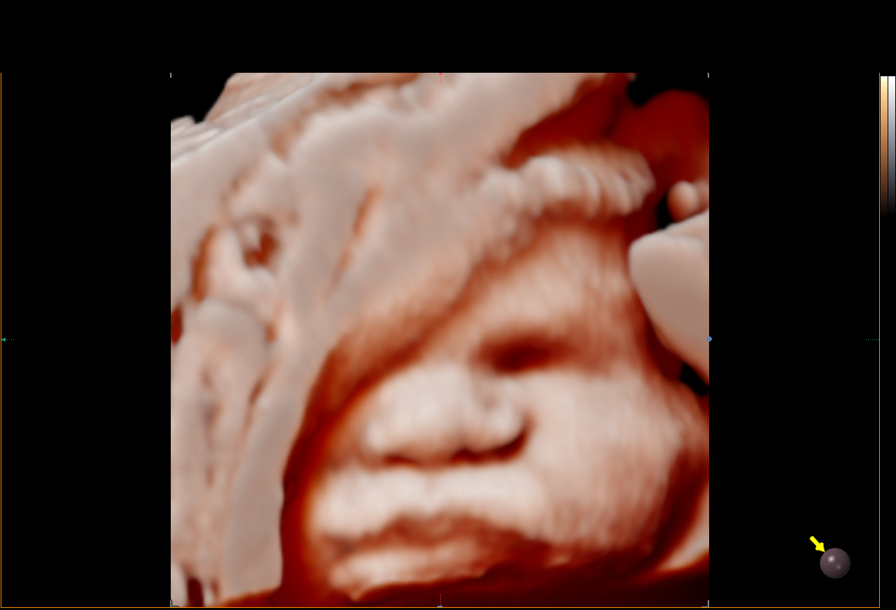

[12 of 20 positions shown; findings below may reference images not displayed]

----------------------------------------------------------------------

 ----------------------------------------------------------------------
Indications

  Gestational diabetes in pregnancy,             [5C]
  controlled by oral hypoglycemic drugs
  Advanced maternal age multigravida 35+,        [5C]
  third trimester (43)
  37 weeks gestation of pregnancy
  Obesity complicating pregnancy (pregravid      [5C] [5C]
  BMI 33)
  Low Risk NIPS
 ----------------------------------------------------------------------
Vital Signs

                                                Height:        5'4"
Fetal Evaluation

 Num Of Fetuses:         1
 Fetal Heart Rate(bpm):  154
 Cardiac Activity:       Observed
 Presentation:           Cephalic

 Amniotic Fluid
 AFI FV:      Within normal limits

 AFI Sum(cm)     %Tile       Largest Pocket(cm)
 18.47           72

 RUQ(cm)       RLQ(cm)       LUQ(cm)        LLQ(cm)

Biophysical Evaluation

 Amniotic F.V:   Pocket => 2 cm             F. Tone:        Observed
 F. Movement:    Observed                   Score:          [DATE]
 F. Breathing:   Observed
OB History

 Gravidity:    4         Term:   1         SAB:   2
 Living:       1
Gestational Age

 LMP:           37w 6d        Date:  [DATE]                 EDD:   [DATE]
 Best:          37w 6d     Det. By:  LMP  ([DATE])          EDD:   [DATE]
Anatomy

 Thoracic:              Appears normal         Bladder:                Appears normal
 Stomach:               Appears normal, left
                        sided
Comments

 A biophysical profile performed today due to gestational
 diabetes that is controlled with Metformin was [DATE].
 There was normal amniotic fluid noted on today's ultrasound
 exam.
 Another biophysical profile was scheduled for the middle of
 next week.  The patient reports that she already has an
 induction of labor scheduled for next [REDACTED].

## 2019-04-16 MED ORDER — HYDROCORTISONE ACETATE 25 MG RE SUPP: 25.0000 mg | Freq: Two times a day (BID) | RECTAL | 2 refills | Status: DC

## 2019-04-16 NOTE — Progress Notes (Signed)
I connected with  [name] on 04/16/19 at  9:15 AM EST by telephone and verified that I am speaking with the correct person using two identifiers.   I discussed the limitations, risks, security and privacy concerns of performing an evaluation and management service by telephone and the availability of in person appointments. I also discussed with the patient that there may be a patient responsible charge related to this service. The patient expressed understanding and agreed to proceed.  Scheryl Marten, RN 04/16/2019  9:18 AM   Having issues with her hemorrhoids  Posted for IOL 3/5 midnight

## 2019-04-16 NOTE — Patient Instructions (Signed)

## 2019-04-16 NOTE — Progress Notes (Signed)
TELEHEALTH OBSTETRICS PRENATAL VIRTUAL VIDEO VISIT ENCOUNTER NOTE  Provider location: [facility]   I connected with [name] on 04/16/19 at  9:15 AM EST by MyChart Video Encounter at home and verified that I am speaking with the correct person using two identifiers.   I discussed the limitations, risks, security and privacy concerns of performing an evaluation and management service virtually and the availability of in person appointments. I also discussed with the patient that there may be a patient responsible charge related to this service. The patient expressed understanding and agreed to proceed. Subjective:  [name] is a 44 y.o. [phone] at [redacted]w[redacted]d being seen today for ongoing prenatal care.  She is currently monitored for the following issues for this high-risk pregnancy and has Depression, recurrent (HCC); MDD (major depressive disorder); Supervision of high risk pregnancy, antepartum; AMA (advanced maternal age) multigravida 48+; Obesity in pregnancy; GDM (gestational diabetes mellitus); BMI 30s; ? History of herpes genitalis; and Group B Streptococcus carrier, +RV culture, currently pregnant on their problem list.  Patient reports no complaints.  Contractions: Irregular. Vag. Bleeding: None.  Movement: Present. Denies any leaking of fluid.   The following portions of the patient's history were reviewed and updated as appropriate: allergies, current medications, past family history, past medical history, past social history, past surgical history and problem list.   Objective:  There were no vitals filed for this visit.  Fetal Status:     Movement: Present     General:  Alert, oriented and cooperative. Patient is in no acute distress.  Respiratory: Normal respiratory effort, no problems with respiration noted  Mental Status: Normal mood and affect. Normal behavior. Normal judgment and thought content.  Rest of physical exam  deferred due to type of encounter  Imaging: [place] MFM FETAL BPP WO NON STRESS  Result Date: 04/16/2019 ----------------------------------------------------------------------  OBSTETRICS REPORT                       (Signed Final 04/16/2019 12:00 pm) ---------------------------------------------------------------------- Patient Info  ID #:       [id number]                          D.O.B.:  1975/12/04 (43 yrs)  Name:       [name]            Visit Date: 04/16/2019 11:14 am ---------------------------------------------------------------------- Performed By  Performed By:     Percell Boston          Ref. Address:     [address]                    RD[address]                                                             Rd  Attending:        Ma Rings MD         [facility]  Referred By:      Federico Flake MD ---------------------------------------------------------------------- Orders   #  Description                          Code         Ordered By   1  [place] MFM FETAL BPP WO NON              76819.01     YU FANG      STRESS  ----------------------------------------------------------------------   #  Order #                    Accession #                 Episode #   1  [id number]                  [id number]                  [id number]  ---------------------------------------------------------------------- Indications   Gestational diabetes in pregnancy,             O24.415   controlled by oral hypoglycemic drugs   Advanced maternal age multigravida 46+,        O38.523   third trimester (43)   [redacted] weeks gestation of pregnancy                Z3A.37   Obesity complicating pregnancy (pregravid      O99.210 E66.9   BMI 33)   Low Risk NIPS  ---------------------------------------------------------------------- Vital Signs                                                 Height:        5'4"  ---------------------------------------------------------------------- Fetal Evaluation  Num Of Fetuses:         1  Fetal Heart Rate(bpm):  154  Cardiac Activity:       Observed  Presentation:           Cephalic  Amniotic Fluid  AFI FV:      Within normal limits  AFI Sum(cm)     %Tile       Largest Pocket(cm)  18.47           72          6.3  RUQ(cm)       RLQ(cm)       LUQ(cm)        LLQ(cm)  4.14          6.17          6.3            1.86 ---------------------------------------------------------------------- Biophysical Evaluation  Amniotic F.V:   Pocket => 2 cm             F. Tone:        Observed  F. Movement:    Observed                   Score:          8/8  F. Breathing:   Observed ---------------------------------------------------------------------- OB History  Gravidity:    4  Term:   1         SAB:   2  Living:       1 ---------------------------------------------------------------------- Gestational Age  LMP:           37w 6d        Date:  07/25/18                 EDD:   05/01/19  Best:          37w 6d     Det. By:  LMP  (07/25/18)          EDD:   05/01/19 ---------------------------------------------------------------------- Anatomy  Thoracic:              Appears normal         Bladder:                Appears normal  Stomach:               Appears normal, left                         sided ---------------------------------------------------------------------- Comments  A biophysical profile performed today due to gestational  diabetes that is controlled with Metformin was 8 out of 8.  There was normal amniotic fluid noted on today's ultrasound  exam.  Another biophysical profile was scheduled for the middle of  next week.  The patient reports that she already has an  induction of labor scheduled for next Friday. ----------------------------------------------------------------------                   Ma Rings, MD Electronically Signed Final Report   04/16/2019 12:00 pm  ----------------------------------------------------------------------  [place] MFM FETAL BPP WO NON STRESS  Result Date: 04/10/2019 ----------------------------------------------------------------------  OBSTETRICS REPORT                       (Signed Final 04/10/2019 05:00 pm) ---------------------------------------------------------------------- Patient Info  ID #:       [id number]                          D.O.B.:  04-14-1975 (43 yrs)  Name:       [name]            Visit Date: 04/10/2019 12:42 pm ---------------------------------------------------------------------- Performed By  Performed By:     Eden Lathe BS      Ref. Address:     [address]                    [address] [address]T                                                             Rd  Attending:        Ma Rings MD         [facility]  Referred By:      Federico Flake MD ---------------------------------------------------------------------- Orders   #  Description                          Code         Ordered By   1  [place] MFM FETAL BPP WO NON              76819.01     YU FANG      STRESS  ----------------------------------------------------------------------   #  Order #                    Accession #                 Episode #   1  [id number]                  [id number]                  [id number]  ---------------------------------------------------------------------- Indications   Gestational diabetes in pregnancy,             O24.415   controlled by oral hypoglycemic drugs   Advanced maternal age multigravida 77+,        O51.523   third trimester (43)   Obesity complicating pregnancy (pregravid      O99.210 E66.9   BMI 33)   Low Risk NIPS   [redacted] weeks gestation of pregnancy                Z3A.37  ---------------------------------------------------------------------- Vital Signs                                                  Height:        5'4" ---------------------------------------------------------------------- Fetal Evaluation  Num Of Fetuses:         1  Fetal Heart Rate(bpm):  154  Cardiac Activity:       Observed  Presentation:           Cephalic  Amniotic Fluid  AFI FV:      Within normal limits  AFI Sum(cm)     %Tile       Largest Pocket(cm)  12.55           43          4.8  RUQ(cm)       RLQ(cm)       LUQ(cm)        LLQ(cm)  4.8           2.67          2.88           2.2 ---------------------------------------------------------------------- Biophysical Evaluation  Amniotic F.V:   Within normal limits       F. Tone:        Observed  F. Movement:    Observed                   Score:          8/8  F. Breathing:   Observed ---------------------------------------------------------------------- OB History  Gravidity:    4         Term:  1         SAB:   2  Living:       1 ---------------------------------------------------------------------- Gestational Age  LMP:           37w 0d        Date:  07/25/18                 EDD:   05/01/19  Best:          37w 0d     Det. By:  LMP  (07/25/18)          EDD:   05/01/19 ---------------------------------------------------------------------- Comments  A biophysical profile performed today due to A2 gestational  diabetes that is currently controlled with Metformin was 8 out  of 8.  There was normal amniotic fluid noted on today's ultrasound  exam.  Another biophysical profile was scheduled in 1 week. ----------------------------------------------------------------------                   Ma RingsVictor Fang, MD Electronically Signed Final Report   04/10/2019 05:00 pm ----------------------------------------------------------------------  US MFM FETAL BPP WO NON STRESS  Result Date: 04/02/2019 ----------------------------------------------------------------------  OBSTETRICS REPORT                       (Signed Final 04/02/2019 10:48 am)  ---------------------------------------------------------------------- Patient Info  ID #:       [id number]                          D.O.B.:  January 26, 1976 (43 yrs)  Name:       [name]            Visit Date: 04/02/2019 10:10 am ---------------------------------------------------------------------- Performed By  Performed By:     Marcellina MillinKelly L Moser          [address]                    [address]                                                             [address]  Attending:        Ma RingsVictor Fang MD         [facility]  Referred By:      Federico FlakeKIMBERLY NILES                    NEWTON MD ---------------------------------------------------------------------- Orders   #  Description                          Code         Ordered By   1  US MFM OB FOLLOW UP  16109.60     Rosana Hoes   2  [place] MFM FETAL BPP WO NON              76819.01     YU FANG      STRESS  ----------------------------------------------------------------------   #  Order #                    Accession #                 Episode #   1  [id number]                  [id number]   2  [id number]                  [id number]                  [id number]  ---------------------------------------------------------------------- Indications   Gestational diabetes in pregnancy,             O24.415   controlled by oral hypoglycemic drugs   Advanced maternal age multigravida 14+,        O73.523   third trimester (43)   [redacted] weeks gestation of pregnancy                Z3A.35   Obesity complicating pregnancy                 O99.210 E66.9   (PREGRAVIDA BMI 33)   Low Risk NIPS  ---------------------------------------------------------------------- Vital Signs                                                 Height:        5'4" ---------------------------------------------------------------------- Fetal Evaluation  Num Of Fetuses:         1   Fetal Heart Rate(bpm):  140  Cardiac Activity:       Observed  Presentation:           Cephalic  Placenta:               Anterior  P. Cord Insertion:      Previously Visualized  Amniotic Fluid  AFI FV:      Within normal limits  AFI Sum(cm)     %Tile       Largest Pocket(cm)  14.7            53          4.39  RUQ(cm)       RLQ(cm)       LUQ(cm)        LLQ(cm)  4.39          4.3           3.15           2.86 ---------------------------------------------------------------------- Biophysical Evaluation  Amniotic F.V:   Within normal limits       F. Tone:        Observed  F. Movement:    Observed                   Score:          8/8  F. Breathing:   Observed ---------------------------------------------------------------------- Biometry  BPD:  86.7  mm     G. Age:  35w 0d         35  %    CI:        70.87   %    70 - 86                                                          FL/HC:      21.0   %    20.1 - 22.1  HC:      328.2  mm     G. Age:  37w 2d         58  %    HC/AC:      1.00        0.93 - 1.11  AC:      327.3  mm     G. Age:  36w 4d         83  %    FL/BPD:     79.6   %    71 - 87  FL:         69  mm     G. Age:  35w 3d         37  %    FL/AC:      21.1   %    20 - 24  Est. FW:    2880  gm      6 lb 6 oz     64  % ---------------------------------------------------------------------- OB History  Gravidity:    4         Term:   1         SAB:   2  Living:       1 ---------------------------------------------------------------------- Gestational Age  LMP:           35w 6d        Date:  07/25/18                 EDD:   05/01/19  U/S Today:     36w 1d                                        EDD:   04/29/19  Best:          35w 5d     Det. By:  Previous Ultrasound      EDD:   05/02/19                                      (09/18/18) ---------------------------------------------------------------------- Anatomy  Cranium:               Appears normal         Aortic Arch:            Previously seen  Cavum:                  Previously seen        Ductal Arch:            Not well visualized  Ventricles:  Appears normal         Diaphragm:              Appears normal  Choroid Plexus:        Previously seen        Stomach:                Appears normal, left                                                                        sided  Cerebellum:            Previously seen        Abdomen:                Appears normal  Posterior Fossa:       Previously seen        Abdominal Wall:         Previously seen  Nuchal Fold:           Previously seen        Cord Vessels:           Previously seen  Face:                  Orbits and profile     Kidneys:                Appear normal                         previously seen  Lips:                  Previously seen        Bladder:                Appears normal  Thoracic:              Appears normal         Spine:                  Previously seen  Heart:                 Previously seen        Upper Extremities:      Previously seen  RVOT:                  Appears normal         Lower Extremities:      Previously seen  LVOT:                  Appears normal  Other:  Technically difficult due to maternal habitus and fetal position. ---------------------------------------------------------------------- Cervix Uterus Adnexa  Cervix  Not visualized (advanced GA >24wks) ---------------------------------------------------------------------- Comments  This patient was seen for a follow up growth scan due to A2  gestational diabetes currently treated with Metformin and due  to advanced maternal age.  The patient reports that her  fingerstick values are within normal limits denies any  problems since her last exam.  She was informed that the fetal growth and amniotic fluid  level appears appropriate for her gestational age.  A biophysical profile performed today was 8  out of 8.  We will continue to follow her with weekly fetal testing until  delivery.  Another biophysical profile was scheduled in 1  week. ----------------------------------------------------------------------                   Ma Rings, MD Electronically Signed Final Report   04/02/2019 10:48 am ----------------------------------------------------------------------  [place] MFM FETAL BPP WO NON STRESS  Result Date: 03/26/2019 ----------------------------------------------------------------------  OBSTETRICS REPORT                       (Signed Final 03/26/2019 01:33 pm) ---------------------------------------------------------------------- Patient Info  ID #:       [id number]                          D.O.B.:  12-08-1975 (43 yrs)  Name:       [name]            Visit Date: 03/26/2019 11:57 am ---------------------------------------------------------------------- Performed By  Performed By:     Sandi Mealy        Ref. Address:     [address]                    RDMS                                                             Rd  Attending:        Ma Rings MD         [facility]  Referred By:      Federico Flake MD ---------------------------------------------------------------------- Orders   #  Description                          Code         Ordered By   1  [place] MFM FETAL BPP WO NON              76819.01     YU FANG      STRESS  ----------------------------------------------------------------------   #  Order #                    Accession #                 Episode #   1  [id number]                  [id number]                  [id number]  ---------------------------------------------------------------------- Indications   Advanced maternal age multigravida 84+,        O69.522   second trimester (43 yrs)   Obesity  complicating pregnancy                 O99.210 E66.9   (PREGRAVIDA BMI 33)   Low Risk NIPS   [redacted] weeks gestation of pregnancy                Z3A.34   ---------------------------------------------------------------------- Vital Signs                                                 Height:        5'4" ---------------------------------------------------------------------- Fetal Evaluation  Num Of Fetuses:         1  Fetal Heart Rate(bpm):  131  Cardiac Activity:       Observed  Presentation:           Cephalic  Placenta:               Anterior  P. Cord Insertion:      Previously Visualized  AFI Sum(cm)     %Tile       Largest Pocket(cm)  21.02           79          6  RUQ(cm)       RLQ(cm)       LUQ(cm)        LLQ(cm)  5.9           4             6               5.12 ---------------------------------------------------------------------- Biophysical Evaluation  Amniotic F.V:   Within normal limits       F. Tone:        Observed  F. Movement:    Observed                   Score:          8/8  F. Breathing:   Observed ---------------------------------------------------------------------- OB History  Gravidity:    4         Term:   1         SAB:   2  Living:       1 ---------------------------------------------------------------------- Gestational Age  LMP:           34w 6d        Date:  07/25/18                 EDD:   05/01/19  Best:          34w 5d     Det. By:  Previous Ultrasound      EDD:   05/02/19                                      (09/18/18) ---------------------------------------------------------------------- Comments  A biophysical profile performed today Due to A2 gestational  diabeteswas 8 out of 8.  There was normal amniotic fluid noted on today's ultrasound  exam.  Another biophysical profile was scheduled in 1 week. ----------------------------------------------------------------------                   Ma Rings, MD Electronically Signed Final Report   03/26/2019 01:33 pm ----------------------------------------------------------------------  [place] MFM FETAL BPP WO NON STRESS  Result Date:  03/20/2019 ----------------------------------------------------------------------  OBSTETRICS REPORT                       (Signed Final 03/20/2019 02:07 pm) ---------------------------------------------------------------------- Patient Info  ID #:       [id number]                          D.O.B.:  Jun 16, 1975 (43 yrs)  Name:       [name]            Visit Date: 03/20/2019 08:16 am ---------------------------------------------------------------------- Performed By  Performed By:     Lenise Arena        Ref. Address:     [address]                    RD[address]                                                             Rd  Attending:        Ma Rings MD         [facility]  Referred By:      Federico Flake MD ---------------------------------------------------------------------- Orders   #  Description                          Code         Ordered By   1  [place] MFM FETAL BPP WO NON              76819.01     YU FANG      STRESS  ----------------------------------------------------------------------   #  Order #                    Accession #                 Episode #   1  [id number]                  [id number]                  [id number]  ---------------------------------------------------------------------- Indications   Advanced maternal age multigravida 71+,        O45.522   second trimester (43 yrs)   Obesity complicating pregnancy                 O99.210 E66.9   (PREGRAVIDA BMI 33)   Low Risk NIPS   [redacted] weeks gestation of pregnancy                Z3A.33  ---------------------------------------------------------------------- Vital Signs  Weight (lb): 217  Height:        5'4"  BMI:         37.24 ---------------------------------------------------------------------- Fetal Evaluation  Num Of Fetuses:         1  Fetal Heart Rate(bpm):  131  Cardiac Activity:        Observed  Presentation:           Cephalic  Placenta:               Anterior  Amniotic Fluid  AFI FV:      Within normal limits  AFI Sum(cm)     %Tile       Largest Pocket(cm)  18.36           68          6.05  RUQ(cm)       RLQ(cm)       LUQ(cm)        LLQ(cm)  5.07          2.82          4.42           6.05 ---------------------------------------------------------------------- Biophysical Evaluation  Amniotic F.V:   Within normal limits       F. Tone:        Observed  F. Movement:    Observed                   Score:          8/8  F. Breathing:   Observed ---------------------------------------------------------------------- OB History  Gravidity:    4         Term:   1         SAB:   2  Living:       1 ---------------------------------------------------------------------- Gestational Age  LMP:           34w 0d        Date:  07/25/18                 EDD:   05/01/19  Best:          33w 6d     Det. By:  Previous Ultrasound      EDD:   05/02/19                                      (09/18/18) ---------------------------------------------------------------------- Anatomy  Thoracic:              Appears normal         Bladder:                Appears normal  Stomach:               Appears normal, left                         sided ---------------------------------------------------------------------- Cervix Uterus Adnexa  Cervix  Not visualized (advanced GA >24wks) ---------------------------------------------------------------------- Comments  This patient was seen for a biophysical profile due to  advanced maternal age and A2 gestational diabetes currently  treated with Metformin 500 mg daily.  Her fingerstick values  have mostly been within normal limits with an occasional  elevated fingerstick value.  A biophysical profile performed today was 8 out of 8.  There was normal amniotic fluid noted on today's ultrasound  exam.  A follow-up biophysical profile was scheduled in 1  week.  ----------------------------------------------------------------------                   Ma Rings, MD Electronically Signed Final Report   03/20/2019 02:07 pm ----------------------------------------------------------------------  [place] MFM OB FOLLOW UP  Result Date: 04/02/2019 ----------------------------------------------------------------------  OBSTETRICS REPORT                       (Signed Final 04/02/2019 10:48 am) ---------------------------------------------------------------------- Patient Info  ID #:       [id number]                          D.O.B.:  03/01/1975 (43 yrs)  Name:       [name]            Visit Date: 04/02/2019 10:10 am ---------------------------------------------------------------------- Performed By  Performed By:     Marcellina Millin          Ref. Address:     [address]                    [address]                                                             Rd  Attending:        Ma Rings MD         [facility]         [facility] Maternal                                                             Fetal Care  Referred By:      Federico Flake MD ---------------------------------------------------------------------- Orders   #  Description                          Code         Ordered By   1  [place] MFM OB FOLLOW UP                  76816.01     YU FANG   2  [place] MFM FETAL BPP WO NON              76819.01     YU FANG      STRESS  ----------------------------------------------------------------------   #  Order #                    Accession #                 Episode #   1  [id number]   2  [id number]  [id number]  ---------------------------------------------------------------------- Indications   Gestational diabetes in pregnancy,             O24.415   controlled by oral hypoglycemic drugs   Advanced maternal age multigravida 38+,        O50.523   third trimester  (43)   [redacted] weeks gestation of pregnancy                Z3A.35   Obesity complicating pregnancy                 O99.210 E66.9   (PREGRAVIDA BMI 33)   Low Risk NIPS  ---------------------------------------------------------------------- Vital Signs                                                 Height:        5'4" ---------------------------------------------------------------------- Fetal Evaluation  Num Of Fetuses:         1  Fetal Heart Rate(bpm):  140  Cardiac Activity:       Observed  Presentation:           Cephalic  Placenta:               Anterior  P. Cord Insertion:      Previously Visualized  Amniotic Fluid  AFI FV:      Within normal limits  AFI Sum(cm)     %Tile       Largest Pocket(cm)  14.7            53          4.39  RUQ(cm)       RLQ(cm)       LUQ(cm)        LLQ(cm)  4.39          4.3           3.15           2.86 ---------------------------------------------------------------------- Biophysical Evaluation  Amniotic F.V:   Within normal limits       F. Tone:        Observed  F. Movement:    Observed                   Score:          8/8  F. Breathing:   Observed ---------------------------------------------------------------------- Biometry  BPD:      86.7  mm     G. Age:  35w 0d         35  %    CI:        70.87   %    70 - 86                                                          FL/HC:      21.0   %    20.1 - 22.1  HC:      328.2  mm     G. Age:  37w 2d         58  %    HC/AC:  1.00        0.93 - 1.11  AC:      327.3  mm     G. Age:  36w 4d         83  %    FL/BPD:     79.6   %    71 - 87  FL:         69  mm     G. Age:  35w 3d         37  %    FL/AC:      21.1   %    20 - 24  Est. FW:    2880  gm      6 lb 6 oz     64  % ---------------------------------------------------------------------- OB History  Gravidity:    4         Term:   1         SAB:   2  Living:       1 ---------------------------------------------------------------------- Gestational Age  LMP:           35w 6d        Date:   07/25/18                 EDD:   05/01/19  U/S Today:     36w 1d                                        EDD:   04/29/19  Best:          35w 5d     Det. By:  Previous Ultrasound      EDD:   05/02/19                                      (09/18/18) ---------------------------------------------------------------------- Anatomy  Cranium:               Appears normal         Aortic Arch:            Previously seen  Cavum:                 Previously seen        Ductal Arch:            Not well visualized  Ventricles:            Appears normal         Diaphragm:              Appears normal  Choroid Plexus:        Previously seen        Stomach:                Appears normal, left                                                                        sided  Cerebellum:            Previously seen  Abdomen:                Appears normal  Posterior Fossa:       Previously seen        Abdominal Wall:         Previously seen  Nuchal Fold:           Previously seen        Cord Vessels:           Previously seen  Face:                  Orbits and profile     Kidneys:                Appear normal                         previously seen  Lips:                  Previously seen        Bladder:                Appears normal  Thoracic:              Appears normal         Spine:                  Previously seen  Heart:                 Previously seen        Upper Extremities:      Previously seen  RVOT:                  Appears normal         Lower Extremities:      Previously seen  LVOT:                  Appears normal  Other:  Technically difficult due to maternal habitus and fetal position. ---------------------------------------------------------------------- Cervix Uterus Adnexa  Cervix  Not visualized (advanced GA >24wks) ---------------------------------------------------------------------- Comments  This patient was seen for a follow up growth scan due to A2  gestational diabetes currently treated with Metformin and due  to  advanced maternal age.  The patient reports that her  fingerstick values are within normal limits denies any  problems since her last exam.  She was informed that the fetal growth and amniotic fluid  level appears appropriate for her gestational age.  A biophysical profile performed today was 8 out of 8.  We will continue to follow her with weekly fetal testing until  delivery.  Another biophysical profile was scheduled in 1 week. ----------------------------------------------------------------------                   Ma Rings, MD Electronically Signed Final Report   04/02/2019 10:48 am ----------------------------------------------------------------------   Assessment and Plan:  Pregnancy: [id number] at [redacted]w[redacted]d 1. Gestational diabetes mellitus (GDM) in third trimester controlled on oral hypoglycemic drug See BabyScripts data Improved on BID metformin, continue this MFM for testing For IOL at 39 wks. Last u/s for growth 64% on 04/02/2019  2. Supervision of high risk pregnancy, antepartum Continue prenatal care.   3. Group B Streptococcus carrier, +RV culture, currently pregnant Treat in labor--discussed with pt.  4. Multigravida of advanced maternal age in third trimester In testing.  Term labor symptoms and general obstetric precautions including but not limited to  vaginal bleeding, contractions, leaking of fluid and fetal movement were reviewed in detail with the patient. I discussed the assessment and treatment plan with the patient. The patient was provided an opportunity to ask questions and all were answered. The patient agreed with the plan and demonstrated an understanding of the instructions. The patient was advised to call back or seek an in-person office evaluation/go to MAU at [facility] for any urgent or concerning symptoms. Please refer to After Visit Summary for other counseling recommendations.   I provided 9 minutes of face-to-face time during this  encounter.  No follow-ups on file.  Future Appointments  Date Time Provider Department Center  04/21/2019 10:30 AM WH-MFC [place] 5 WH-MFCUS MFC-US  04/22/2019  9:00 AM MC-SCREENING MC-SDSC None  04/24/2019 12:00 AM MC-LD SCHED ROOM MC-INDC None    Reva Bores, MD [facility]

## 2019-04-21 ENCOUNTER — Ambulatory Visit (HOSPITAL_COMMUNITY): Payer: BC Managed Care – PPO | Admitting: *Deleted

## 2019-04-21 ENCOUNTER — Ambulatory Visit (HOSPITAL_COMMUNITY)
Admission: RE | Admit: 2019-04-21 | Discharge: 2019-04-21 | Disposition: A | Discharge status: Home / Self Care | Payer: BC Managed Care – PPO | Source: Ambulatory Visit | Attending: Obstetrics and Gynecology | Admitting: Obstetrics and Gynecology

## 2019-04-21 ENCOUNTER — Encounter (HOSPITAL_COMMUNITY): Payer: Self-pay

## 2019-04-21 ENCOUNTER — Other Ambulatory Visit: Payer: Self-pay

## 2019-04-21 VITALS — BP 118/88 | HR 93 | Temp 97.4°F | Wt 218.8 lb

## 2019-04-21 DIAGNOSIS — O09523 Supervision of elderly multigravida, third trimester: Secondary | ICD-10-CM | POA: Diagnosis not present

## 2019-04-21 DIAGNOSIS — O24415 Gestational diabetes mellitus in pregnancy, controlled by oral hypoglycemic drugs: Secondary | ICD-10-CM | POA: Diagnosis not present

## 2019-04-21 DIAGNOSIS — O99820 Streptococcus B carrier state complicating pregnancy: Secondary | ICD-10-CM | POA: Diagnosis not present

## 2019-04-21 DIAGNOSIS — Z3A38 38 weeks gestation of pregnancy: Secondary | ICD-10-CM | POA: Diagnosis not present

## 2019-04-21 IMAGING — US US MFM FETAL BPP W/O NON-STRESS
1 series · 14 of 28 positions shown · non-contrast
Comparison: none

[Series 1: us mfm fetal bpp w/o non-stress · 32 acquisitions, 14 frames shown]
[im 2/32]
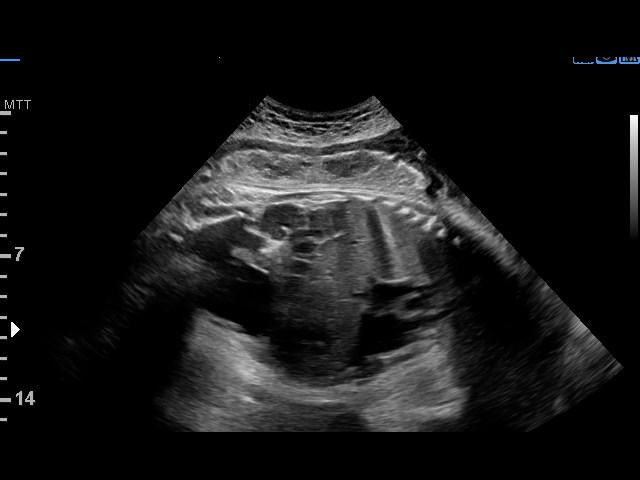
[im 4/32]
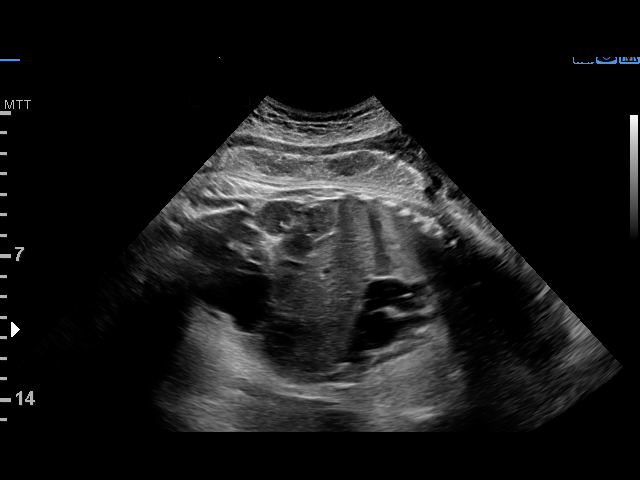
[im 6/32]
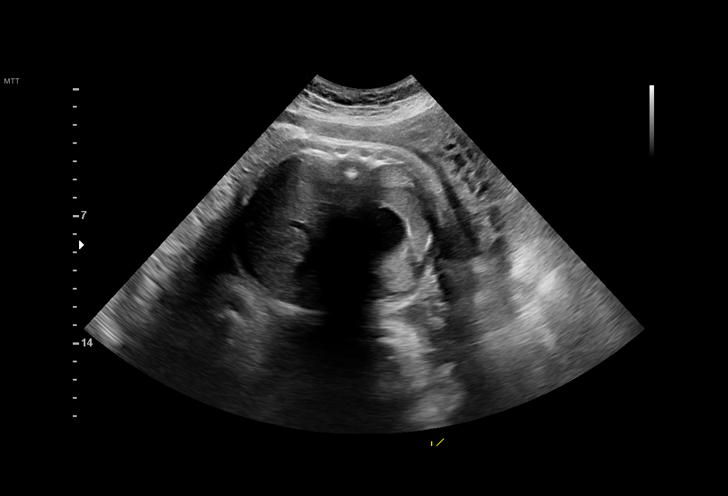
[im 9/32]
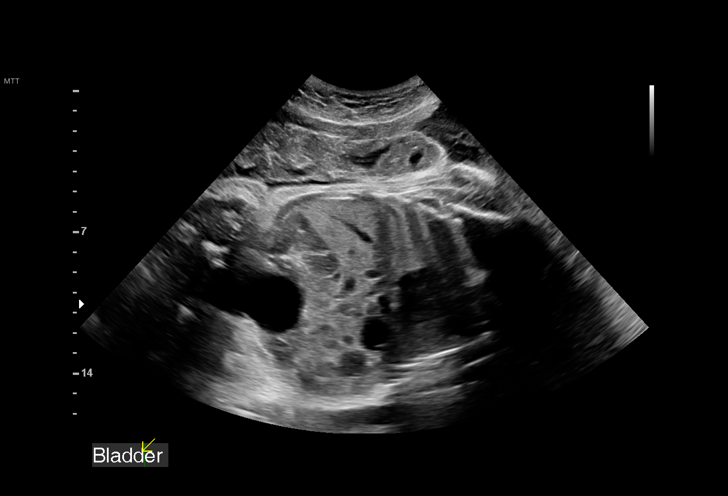
[im 11/32]
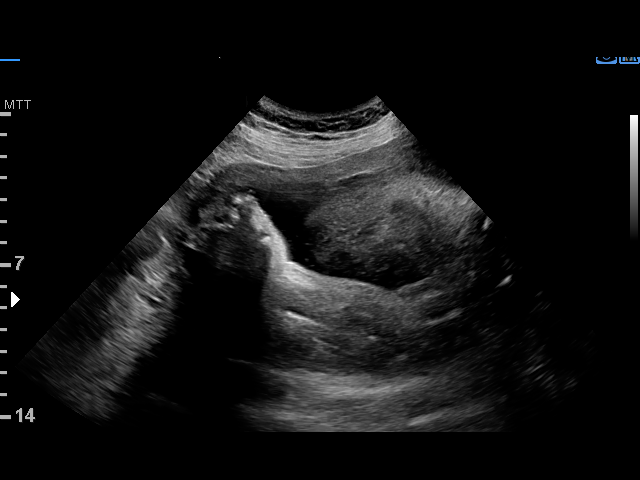
[im 13/32]
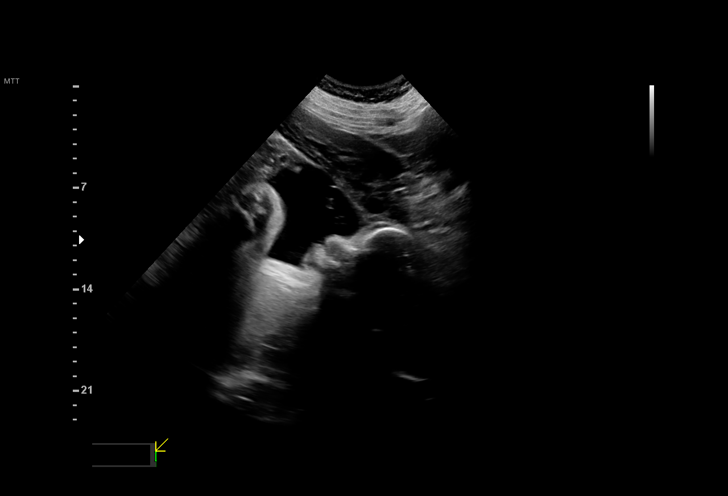
[im 15/32]
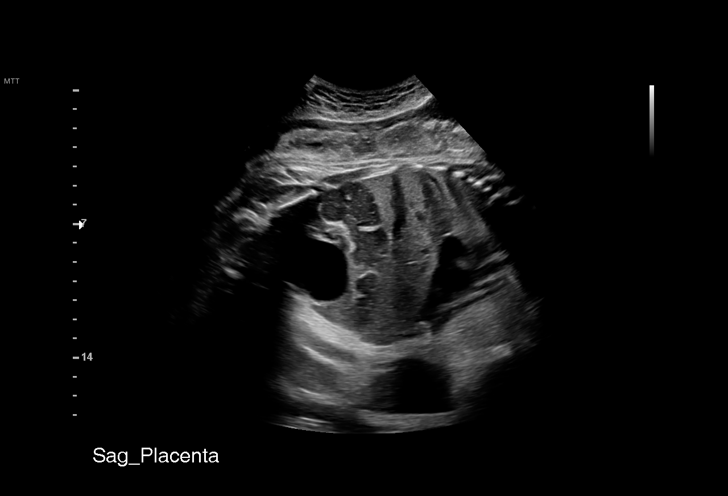
[im 18/32]
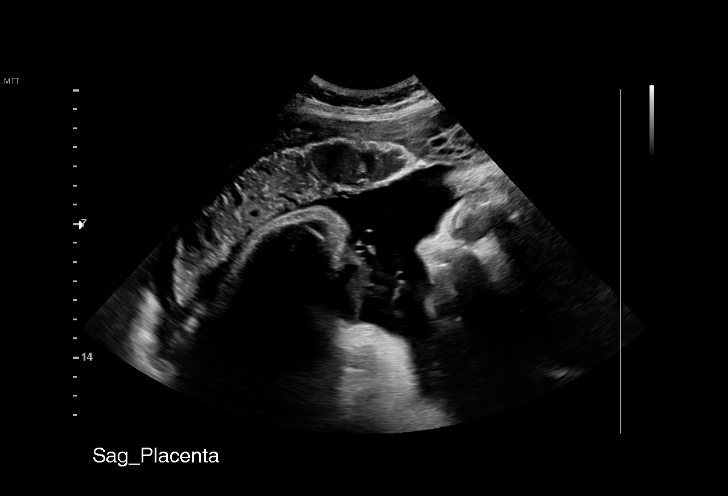
[im 20/32]
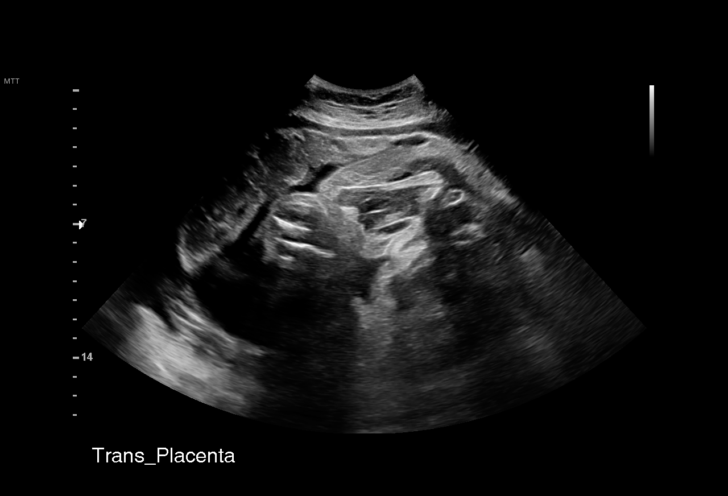
[im 22/32]
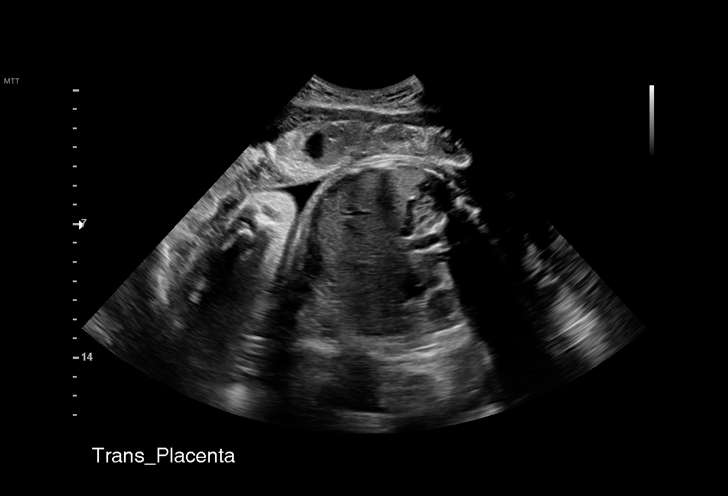
[im 25/32]
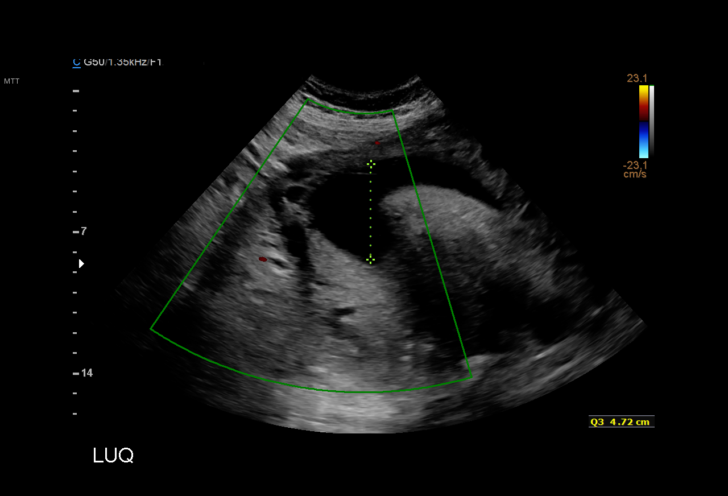
[im 27/32]
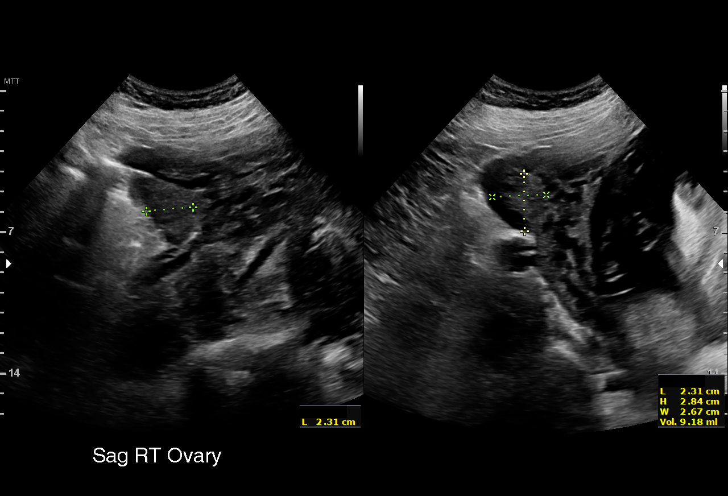
[im 29/32]
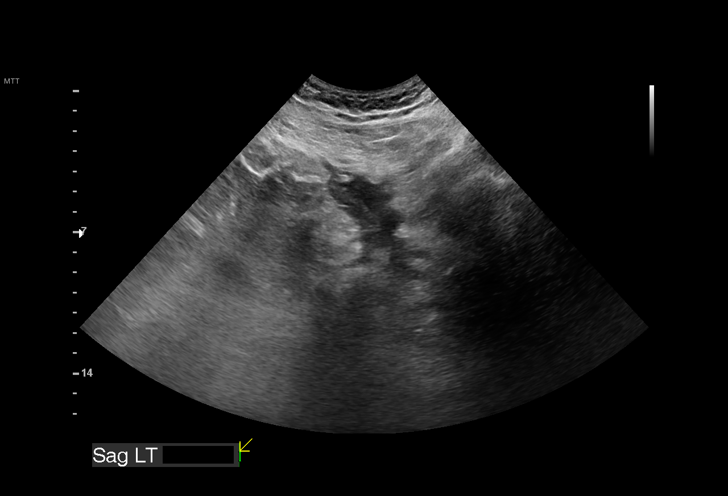
[im 32/32]
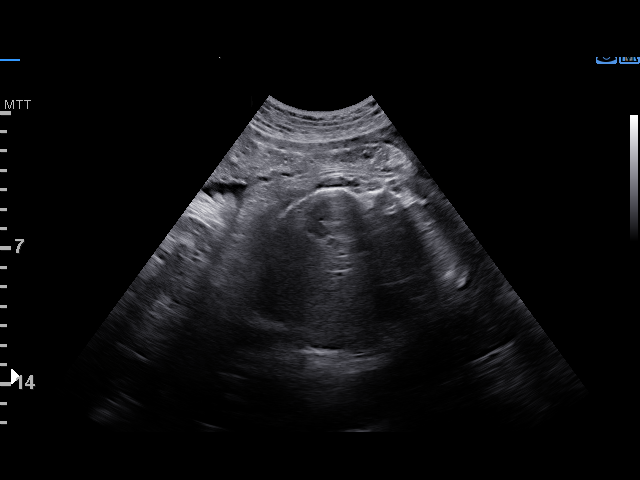

[14 of 28 positions shown; findings below may reference images not displayed]

----------------------------------------------------------------------

 ----------------------------------------------------------------------
Indications

  38 weeks gestation of pregnancy
  Gestational diabetes in pregnancy,             [H4]
  controlled by oral hypoglycemic drugs
  Advanced maternal age multigravida 35+,        [H4]
  third trimester (43)
  Obesity complicating pregnancy (pregravid      [H4] [H4]
  BMI 33)
  Low Risk NIPS
 ----------------------------------------------------------------------
Vital Signs

                                                Height:        5'4"
Fetal Evaluation

 Num Of Fetuses:         1
 Fetal Heart Rate(bpm):  155
 Cardiac Activity:       Observed
 Presentation:           Cephalic
 Placenta:               Anterior
 P. Cord Insertion:      Previously Visualized

 Amniotic Fluid
 AFI FV:      Within normal limits

 AFI Sum(cm)     %Tile       Largest Pocket(cm)
 15.22           60

 RUQ(cm)       RLQ(cm)       LUQ(cm)        LLQ(cm)

Biophysical Evaluation
 Amniotic F.V:   Pocket => 2 cm             F. Tone:        Observed
 F. Movement:    Observed                   Score:          [DATE]
 F. Breathing:   Observed
OB History

 Gravidity:    4         Term:   1         SAB:   2
 Living:       1
Gestational Age

 LMP:           38w 4d        Date:  [DATE]                 EDD:   [DATE]
 Best:          38w 4d     Det. By:  LMP  ([DATE])          EDD:   [DATE]
Anatomy

 Cranium:               Previously seen        Aortic Arch:            Previously seen
 Cavum:                 Previously seen        Ductal Arch:            Not well visualized
 Ventricles:            Previously seen        Diaphragm:              Appears normal
 Choroid Plexus:        Previously seen        Stomach:                Appears normal, left
                                                                       sided
 Cerebellum:            Previously seen        Abdomen:                Previously seen
 Posterior Fossa:       Previously seen        Abdominal Wall:         Previously seen
 Nuchal Fold:           Previously seen        Cord Vessels:           Previously seen
 Face:                  Orbits and profile     Kidneys:                Appear normal
                        previously seen
 Lips:                  Previously seen        Bladder:                Appears normal
 Thoracic:              Previously seen        Spine:                  Previously seen
 Heart:                 Previously seen        Upper Extremities:      Previously seen
 RVOT:                  Previously seen        Lower Extremities:      Previously seen
 LVOT:                  Previously seen

 Other:  Technicallly difficult due to advanced GA and maternal habitus.
         Technically difficult due to fetal position.
Cervix Uterus Adnexa

 Cervix
 Not visualized (advanced GA >[H4])

 Uterus
 No abnormality visualized.

 Left Ovary
 Not visualized. No adnexal mass visualized.

 Right Ovary
 Within normal limits. No adnexal mass visualized.

 Cul De Sac
 No free fluid seen.

 Adnexa
 No abnormality visualized.
Impression

 Biophysical profile [DATE]
 [H4]
 Good fetal movement and amniotic fluid volume
 AMA 43 yo
Recommendations

 Follow up per provider.
 Planned delivery at 39 weeks.

## 2019-04-22 ENCOUNTER — Other Ambulatory Visit (HOSPITAL_COMMUNITY)
Admission: RE | Admit: 2019-04-22 | Discharge: 2019-04-22 | Disposition: A | Discharge status: Home / Self Care | Payer: BC Managed Care – PPO | Source: Ambulatory Visit | Attending: Family Medicine | Admitting: Family Medicine

## 2019-04-22 ENCOUNTER — Other Ambulatory Visit: Payer: Self-pay | Admitting: Advanced Practice Midwife

## 2019-04-22 DIAGNOSIS — Z01812 Encounter for preprocedural laboratory examination: Secondary | ICD-10-CM | POA: Diagnosis not present

## 2019-04-22 DIAGNOSIS — M62830 Muscle spasm of back: Secondary | ICD-10-CM | POA: Diagnosis not present

## 2019-04-22 DIAGNOSIS — Z20822 Contact with and (suspected) exposure to covid-19: Secondary | ICD-10-CM | POA: Insufficient documentation

## 2019-04-22 DIAGNOSIS — M5416 Radiculopathy, lumbar region: Secondary | ICD-10-CM | POA: Diagnosis not present

## 2019-04-22 DIAGNOSIS — M9905 Segmental and somatic dysfunction of pelvic region: Secondary | ICD-10-CM | POA: Diagnosis not present

## 2019-04-22 DIAGNOSIS — M9903 Segmental and somatic dysfunction of lumbar region: Secondary | ICD-10-CM | POA: Diagnosis not present

## 2019-04-22 LAB — SARS CORONAVIRUS 2 (TAT 6-24 HRS): SARS Coronavirus 2: NEGATIVE

## 2019-04-23 DIAGNOSIS — F411 Generalized anxiety disorder: Secondary | ICD-10-CM | POA: Diagnosis not present

## 2019-04-24 ENCOUNTER — Inpatient Hospital Stay (HOSPITAL_COMMUNITY): Payer: BC Managed Care – PPO | Admitting: Anesthesiology

## 2019-04-24 ENCOUNTER — Inpatient Hospital Stay (HOSPITAL_COMMUNITY): Payer: BC Managed Care – PPO

## 2019-04-24 ENCOUNTER — Encounter (HOSPITAL_COMMUNITY): Payer: Self-pay | Admitting: Family Medicine

## 2019-04-24 ENCOUNTER — Other Ambulatory Visit: Payer: Self-pay

## 2019-04-24 ENCOUNTER — Inpatient Hospital Stay (HOSPITAL_COMMUNITY)
Admission: AD | Admit: 2019-04-24 | Discharge: 2019-04-26 | DRG: 806 | Disposition: A | Discharge status: Home / Self Care | Payer: BC Managed Care – PPO | Attending: Family Medicine | Admitting: Family Medicine

## 2019-04-24 DIAGNOSIS — Z3A39 39 weeks gestation of pregnancy: Secondary | ICD-10-CM | POA: Diagnosis not present

## 2019-04-24 DIAGNOSIS — Z23 Encounter for immunization: Secondary | ICD-10-CM | POA: Diagnosis not present

## 2019-04-24 DIAGNOSIS — Z833 Family history of diabetes mellitus: Secondary | ICD-10-CM | POA: Diagnosis not present

## 2019-04-24 DIAGNOSIS — O99820 Streptococcus B carrier state complicating pregnancy: Secondary | ICD-10-CM

## 2019-04-24 DIAGNOSIS — O9832 Other infections with a predominantly sexual mode of transmission complicating childbirth: Secondary | ICD-10-CM | POA: Diagnosis present

## 2019-04-24 DIAGNOSIS — O24415 Gestational diabetes mellitus in pregnancy, controlled by oral hypoglycemic drugs: Secondary | ICD-10-CM

## 2019-04-24 DIAGNOSIS — O99214 Obesity complicating childbirth: Secondary | ICD-10-CM | POA: Diagnosis not present

## 2019-04-24 DIAGNOSIS — O24919 Unspecified diabetes mellitus in pregnancy, unspecified trimester: Secondary | ICD-10-CM | POA: Diagnosis present

## 2019-04-24 DIAGNOSIS — A6000 Herpesviral infection of urogenital system, unspecified: Secondary | ICD-10-CM | POA: Diagnosis not present

## 2019-04-24 DIAGNOSIS — Z0542 Observation and evaluation of newborn for suspected metabolic condition ruled out: Secondary | ICD-10-CM | POA: Diagnosis not present

## 2019-04-24 DIAGNOSIS — O24425 Gestational diabetes mellitus in childbirth, controlled by oral hypoglycemic drugs: Secondary | ICD-10-CM | POA: Diagnosis not present

## 2019-04-24 DIAGNOSIS — O99824 Streptococcus B carrier state complicating childbirth: Secondary | ICD-10-CM | POA: Diagnosis not present

## 2019-04-24 DIAGNOSIS — O164 Unspecified maternal hypertension, complicating childbirth: Secondary | ICD-10-CM | POA: Diagnosis present

## 2019-04-24 DIAGNOSIS — O24429 Gestational diabetes mellitus in childbirth, unspecified control: Secondary | ICD-10-CM | POA: Diagnosis not present

## 2019-04-24 DIAGNOSIS — O09523 Supervision of elderly multigravida, third trimester: Secondary | ICD-10-CM | POA: Diagnosis not present

## 2019-04-24 LAB — GLUCOSE, CAPILLARY
Glucose-Capillary: 125 mg/dL — ABNORMAL HIGH (ref 70–99)
Glucose-Capillary: 77 mg/dL (ref 70–99)
Glucose-Capillary: 108 mg/dL — ABNORMAL HIGH (ref 70–99)
Glucose-Capillary: 135 mg/dL — ABNORMAL HIGH (ref 70–99)
Glucose-Capillary: 74 mg/dL (ref 70–99)
Glucose-Capillary: 136 mg/dL — ABNORMAL HIGH (ref 70–99)

## 2019-04-24 LAB — CBC
HCT: 40.4 % (ref 36.0–46.0)
Hemoglobin: 13.9 g/dL (ref 12.0–15.0)
MCH: 33.5 pg (ref 26.0–34.0)
MCHC: 34.4 g/dL (ref 30.0–36.0)
MCV: 97.3 fL (ref 80.0–100.0)
Platelets: 189 10*3/uL (ref 150–400)
RBC: 4.15 MIL/uL (ref 3.87–5.11)
RDW: 12.3 % (ref 11.5–15.5)
WBC: 9.2 10*3/uL (ref 4.0–10.5)
nRBC: 0 % (ref 0.0–0.2)

## 2019-04-24 LAB — TYPE AND SCREEN
ABO/RH(D): A POS
Antibody Screen: NEGATIVE

## 2019-04-24 LAB — ABO/RH: ABO/RH(D): A POS

## 2019-04-24 LAB — RPR: RPR Ser Ql: NONREACTIVE

## 2019-04-24 MED ORDER — MISOPROSTOL 50MCG HALF TABLET
ORAL_TABLET | ORAL | Status: AC
  Filled 2019-04-24: qty 1

## 2019-04-24 MED ORDER — FENTANYL-BUPIVACAINE-NACL 0.5-0.125-0.9 MG/250ML-% EP SOLN
EPIDURAL | Status: AC
  Filled 2019-04-24: qty 250

## 2019-04-24 MED ORDER — SOD CITRATE-CITRIC ACID 500-334 MG/5ML PO SOLN: 30.0000 mL | ORAL | Status: DC | PRN

## 2019-04-24 MED ORDER — METFORMIN HCL 500 MG PO TABS
500.0000 mg | ORAL_TABLET | Freq: Two times a day (BID) | ORAL | Status: DC
  Administered 2019-04-24 (×2): 500 mg via ORAL
  Filled 2019-04-24 (×4): qty 1

## 2019-04-24 MED ORDER — LACTATED RINGERS IV SOLN: INTRAVENOUS | Status: DC

## 2019-04-24 MED ORDER — SODIUM CHLORIDE (PF) 0.9 % IJ SOLN
INTRAMUSCULAR | Status: DC | PRN
  Administered 2019-04-24: 12 mL/h via EPIDURAL

## 2019-04-24 MED ORDER — FENTANYL-BUPIVACAINE-NACL 0.5-0.125-0.9 MG/250ML-% EP SOLN: 12.0000 mL/h | EPIDURAL | Status: DC | PRN

## 2019-04-24 MED ORDER — TERBUTALINE SULFATE 1 MG/ML IJ SOLN: 0.2500 mg | Freq: Once | INTRAMUSCULAR | Status: DC | PRN

## 2019-04-24 MED ORDER — PHENYLEPHRINE 40 MCG/ML (10ML) SYRINGE FOR IV PUSH (FOR BLOOD PRESSURE SUPPORT): 80.0000 ug | PREFILLED_SYRINGE | INTRAVENOUS | Status: DC | PRN

## 2019-04-24 MED ORDER — LACTATED RINGERS IV SOLN: 500.0000 mL | INTRAVENOUS | Status: DC | PRN

## 2019-04-24 MED ORDER — EPHEDRINE 5 MG/ML INJ: 10.0000 mg | INTRAVENOUS | Status: DC | PRN

## 2019-04-24 MED ORDER — DIPHENHYDRAMINE HCL 50 MG/ML IJ SOLN: 12.5000 mg | INTRAMUSCULAR | Status: DC | PRN

## 2019-04-24 MED ORDER — OXYCODONE-ACETAMINOPHEN 5-325 MG PO TABS: 1.0000 | ORAL_TABLET | ORAL | Status: DC | PRN

## 2019-04-24 MED ORDER — FENTANYL CITRATE (PF) 100 MCG/2ML IJ SOLN
INTRAMUSCULAR | Status: DC | PRN
  Administered 2019-04-24 (×2): 50 ug via EPIDURAL

## 2019-04-24 MED ORDER — OXYTOCIN 40 UNITS IN NORMAL SALINE INFUSION - SIMPLE MED
2.5000 [IU]/h | INTRAVENOUS | Status: DC
  Filled 2019-04-24: qty 1000

## 2019-04-24 MED ORDER — MISOPROSTOL 50MCG HALF TABLET
50.0000 ug | ORAL_TABLET | ORAL | Status: DC
  Administered 2019-04-24: 50 ug via BUCCAL

## 2019-04-24 MED ORDER — VALACYCLOVIR HCL 500 MG PO TABS
500.0000 mg | ORAL_TABLET | Freq: Two times a day (BID) | ORAL | Status: DC
  Administered 2019-04-24: 500 mg via ORAL
  Filled 2019-04-24: qty 1

## 2019-04-24 MED ORDER — LACTATED RINGERS IV SOLN: 500.0000 mL | Freq: Once | INTRAVENOUS | Status: DC

## 2019-04-24 MED ORDER — BUPIVACAINE HCL (PF) 0.25 % IJ SOLN
INTRAMUSCULAR | Status: DC | PRN
  Administered 2019-04-24 (×2): 5 mL via EPIDURAL

## 2019-04-24 MED ORDER — OXYCODONE-ACETAMINOPHEN 5-325 MG PO TABS: 2.0000 | ORAL_TABLET | ORAL | Status: DC | PRN

## 2019-04-24 MED ORDER — OXYTOCIN BOLUS FROM INFUSION
500.0000 mL | Freq: Once | INTRAVENOUS | Status: AC
  Administered 2019-04-24: 500 mL via INTRAVENOUS

## 2019-04-24 MED ORDER — ACETAMINOPHEN 325 MG PO TABS
650.0000 mg | ORAL_TABLET | ORAL | Status: DC | PRN
  Administered 2019-04-24: 650 mg via ORAL
  Filled 2019-04-24: qty 2

## 2019-04-24 MED ORDER — LIDOCAINE HCL (PF) 1 % IJ SOLN: 30.0000 mL | INTRAMUSCULAR | Status: DC | PRN

## 2019-04-24 MED ORDER — SODIUM CHLORIDE 0.9 % IV SOLN
5.0000 10*6.[IU] | Freq: Once | INTRAVENOUS | Status: AC
  Administered 2019-04-24: 5 10*6.[IU] via INTRAVENOUS
  Filled 2019-04-24: qty 5

## 2019-04-24 MED ORDER — PENICILLIN G POT IN DEXTROSE 60000 UNIT/ML IV SOLN
3.0000 10*6.[IU] | INTRAVENOUS | Status: DC
  Administered 2019-04-24 (×4): 3 10*6.[IU] via INTRAVENOUS
  Filled 2019-04-24 (×4): qty 50

## 2019-04-24 MED ORDER — ONDANSETRON HCL 4 MG/2ML IJ SOLN: 4.0000 mg | Freq: Four times a day (QID) | INTRAMUSCULAR | Status: DC | PRN

## 2019-04-24 MED ORDER — MISOPROSTOL 25 MCG QUARTER TABLET
25.0000 ug | ORAL_TABLET | ORAL | Status: DC | PRN
  Administered 2019-04-24 (×2): 25 ug via VAGINAL
  Filled 2019-04-24 (×2): qty 1

## 2019-04-24 MED ORDER — FLEET ENEMA 7-19 GM/118ML RE ENEM: 1.0000 | ENEMA | RECTAL | Status: DC | PRN

## 2019-04-24 MED ORDER — LIDOCAINE HCL (PF) 1 % IJ SOLN
INTRAMUSCULAR | Status: DC | PRN
  Administered 2019-04-24 (×3): 5 mL via EPIDURAL

## 2019-04-24 MED ORDER — FENTANYL CITRATE (PF) 100 MCG/2ML IJ SOLN
INTRAMUSCULAR | Status: AC
  Administered 2019-04-24: 100 ug
  Filled 2019-04-24: qty 2

## 2019-04-24 NOTE — Anesthesia Preprocedure Evaluation (Signed)
Anesthesia Evaluation    Reviewed: Allergy & Precautions, Patient's Chart, lab work & pertinent test results  History of Anesthesia Complications Negative for: history of anesthetic complications  Airway Mallampati: II  TM Distance: >3 FB Neck ROM: Full    Dental no notable dental hx. (+) Dental Advisory Given   Pulmonary neg pulmonary ROS,    Pulmonary exam normal        Cardiovascular negative cardio ROS Normal cardiovascular exam     Neuro/Psych PSYCHIATRIC DISORDERS Depression negative neurological ROS     GI/Hepatic negative GI ROS, Neg liver ROS,   Endo/Other  diabetesMorbid obesity  Renal/GU negative Renal ROS     Musculoskeletal negative musculoskeletal ROS (+)   Abdominal   Peds  Hematology negative hematology ROS (+)   Anesthesia Other Findings Day of surgery medications reviewed with the patient.  Reproductive/Obstetrics                             Anesthesia Physical Anesthesia Plan  ASA: III  Anesthesia Plan: Epidural   Post-op Pain Management:    Induction:   PONV Risk Score and Plan:   Airway Management Planned: Natural Airway  Additional Equipment:   Intra-op Plan:   Post-operative Plan:   Informed Consent: I have reviewed the patients History and Physical, chart, labs and discussed the procedure including the risks, benefits and alternatives for the proposed anesthesia with the patient or authorized representative who has indicated his/her understanding and acceptance.     Dental advisory given  Plan Discussed with: Anesthesiologist  Anesthesia Plan Comments:         Anesthesia Quick Evaluation

## 2019-04-24 NOTE — Progress Notes (Signed)
LABOR PROGRESS NOTE  [name] is a 44 y.o. [id number] at [redacted]w[redacted]d  admitted for IOL for GDMA2.  Subjective: Patient doing well. Just recently ate. She is still deciding on whether or not she wants an epidural. She is happy about the cervical change she has made and would like to hold off on AROM or pit to see if she will continue to make change on her own.  Objective: BP 118/72   Pulse 77   Temp 98.7 F (37.1 C) (Oral)   Resp 16   Ht 5\' 4"  (1.626 m)   Wt 101.8 kg   LMP 07/25/2018 (Exact Date)   BMI 38.54 kg/m  or  Vitals:   04/24/19 0900 04/24/19 1056 04/24/19 1140 04/24/19 1230  BP: 132/82 (!) 133/105 (!) 122/93 118/72  Pulse: 81 82 72 77  Resp:   16   Temp:    98.7 F (37.1 C)  TempSrc:    Oral  Weight:      Height:        Dilation: 5 Effacement (%): 70 Cervical Position: Posterior Station: -2 Presentation: Vertex Exam by:: [email or web address] RN FHT: baseline rate 140, moderate varibility, reactive acel, absent decel Toco: q1-3  Labs: Lab Results  Component Value Date   WBC 9.2 04/24/2019   HGB 13.9 04/24/2019   HCT 40.4 04/24/2019   MCV 97.3 04/24/2019   PLT 189 04/24/2019    Patient Active Problem List   Diagnosis Date Noted  . Diabetes in pregnancy 04/24/2019  . Group B Streptococcus carrier, +RV culture, currently pregnant 04/05/2019  . BMI 30s 02/26/2019  . ? History of herpes genitalis 02/26/2019  . GDM (gestational diabetes mellitus) 02/07/2019  . AMA (advanced maternal age) multigravida 48+ 10/09/2018  . Obesity in pregnancy 10/09/2018  . Supervision of high risk pregnancy, antepartum 09/18/2018  . MDD (major depressive disorder) 04/08/2018  . Depression, recurrent (HCC) 10/18/2016    Assessment / Plan: 44 y.o. 45 at [redacted]w[redacted]d here for IOL for GDMA2 on metformin.  Labor: S/p cytotec x3. Foley bulb placed this morning around 0915 and has already been removed due to dilation. Patient currently contracting well and every 1-3 minutes. She  would prefer to labor as naturally as possible since she is making good change. She would like to wait on pitocin/AROM and walk around to see if she can continue on her own.  Fetal Wellbeing:  Category 1 Pain Control:  Would like to labor without pain meds. Epidural as backup if patient unable to tolerate. Received epidural with her previous pregnancy. GBS: Positive, PCN Anticipated MOD:  VD Hx Genital Herpes: valtrex ppx dosing GDMA2: CBG's q4h in latent labor, q2h in active labor  [redacted]w[redacted]d, MD OB Resident 04/24/2019, 3:00 PM

## 2019-04-24 NOTE — Discharge Summary (Addendum)
Postpartum Discharge Summary     Patient Name: [name] DOB: 1975/03/03 MRN: [id number]  Date of admission: 04/24/2019 Delivering Provider: Comer Locket   Date of discharge: 04/26/2019  Admitting diagnosis: Diabetes in pregnancy [O24.919] Intrauterine pregnancy: [redacted]w[redacted]d    Secondary diagnosis:  Active Problems:   Diabetes in pregnancy   NSVD (normal spontaneous vaginal delivery)  Additional problems: None     Discharge diagnosis: Term Pregnancy Delivered                                                                                                Post partum procedures:none  Augmentation: AROM, Cytotec and Foley Balloon  Complications: None  Hospital course:  Induction of Labor With Vaginal Delivery   44y.o. yo [id number] 346w0das admitted to the hospital 04/24/2019 for induction of labor.  Indication for induction: A2 DM.  Patient had an uncomplicated labor course as follows:  Patient's induction was initiated with FB and cytotec. She received a a total of 3 doses of cytotec and AROM, then progressed without further intervention to completely dilated, delivering shortly after.  Membrane Rupture Time/Date: 6:52 PM ,04/24/2019   Intrapartum Procedures: Episiotomy: None [1]                                         Lacerations:  None [1]  Patient had delivery of a Viable infant.  Information for the patient's newborn:  [place] [facility] [id number][[id number]]    04/24/2019  Details of delivery can be found in separate delivery note. Towards the end of her labor, she had elevated blood pressures, so Pre-E labs were sent and were wnl.   Patient had a routine postpartum course. BP postpartum mostly 110s/70s. Mild bilateral lower extremity nonpitting edema.  Reviewed expected normal course with diuresis with pt.  DVT/PE s/sx reviewed.  Patient is discharged home 04/26/19. Delivery time: 10:30 PM    Magnesium Sulfate received: No BMZ received:  No Rhophylac:N/A MMR:N/A Transfusion:No  Physical exam  Vitals:   04/25/19 1030 04/25/19 1440 04/25/19 1930 04/26/19 0408  BP: (!) 141/91 116/75 117/82 109/73  Pulse: 71 72 72 70  Resp: '18 17 18 18  ' Temp: 98.2 F (36.8 C) 97.9 F (36.6 C) 98 F (36.7 C) 97.7 F (36.5 C)  TempSrc: Oral Oral Oral Oral  SpO2:      Weight:      Height:       General: alert, cooperative and no distress Lochia: appropriate Uterine Fundus: firm Incision: N/A DVT Evaluation: No evidence of DVT seen on physical exam. Labs: Lab Results  Component Value Date   WBC 18.2 (H) 04/25/2019   HGB 13.2 04/25/2019   HCT 38.2 04/25/2019   MCV 95.7 04/25/2019   PLT 172 04/25/2019   CMP Latest Ref Rng & Units 04/25/2019  Glucose 70 - 99 mg/dL 202(H)  BUN 6 - 20 mg/dL 5(L)  Creatinine 0.44 - 1.00 mg/dL 0.66  Sodium 135 - 145  mmol/L 135  Potassium 3.5 - 5.1 mmol/L 3.4(L)  Chloride 98 - 111 mmol/L 107  CO2 22 - 32 mmol/L 18(L)  Calcium 8.9 - 10.3 mg/dL 8.8(L)  Total Protein 6.5 - 8.1 g/dL 5.5(L)  Total Bilirubin 0.3 - 1.2 mg/dL 0.8  Alkaline Phos 38 - 126 U/L 108  AST 15 - 41 U/L 22  ALT 0 - 44 U/L 16   Edinburgh Score: Edinburgh Postnatal Depression Scale Screening Tool 04/25/2019  I have been able to laugh and see the funny side of things. 0  I have looked forward with enjoyment to things. 0  I have blamed myself unnecessarily when things went wrong. 0  I have been anxious or worried for no good reason. 1  I have felt scared or panicky for no good reason. 0  Things have been getting on top of me. 0  I have been so unhappy that I have had difficulty sleeping. 0  I have felt sad or miserable. 0  I have been so unhappy that I have been crying. 0  The thought of harming myself has occurred to me. 0  Edinburgh Postnatal Depression Scale Total 1    Discharge instruction: per After Visit Summary and "Baby and Me Booklet".  After visit meds:  Allergies as of 04/26/2019   No Known Allergies      Medication List    STOP taking these medications   Accu-Chek FastClix Lancets Misc   Accu-Chek Guide test strip Generic drug: glucose blood   Accu-Chek Guide w/Device Kit   aspirin EC 81 MG tablet   folic acid 326 MCG tablet Commonly known as: FOLVITE   metFORMIN 500 MG tablet Commonly known as: GLUCOPHAGE   valACYclovir 1000 MG tablet Commonly known as: VALTREX     TAKE these medications   Breast Pump Misc Dispense one breast pump for patient   FIBER-CAPS PO Take 2 capsules by mouth daily.   hydrocortisone 25 MG suppository Commonly known as: ANUSOL-HC Place 1 suppository (25 mg total) rectally 2 (two) times daily.   ibuprofen 600 MG tablet Commonly known as: ADVIL Take 1 tablet (600 mg total) by mouth every 6 (six) hours.   norethindrone 0.35 MG tablet Commonly known as: MICRONOR Take 1 tablet (0.35 mg total) by mouth daily. Start taking on: May 17, 2019   prenatal multivitamin Tabs tablet Take 1 tablet by mouth daily at 12 noon.       Diet: routine diet  Activity: Advance as tolerated. Pelvic rest for 6 weeks.   Outpatient follow up:4 weeks Follow up Appt:No future appointments. Follow up Visit: [place] [facility] Follow up.   Specialty: Obstetrics and Gynecology Why: In 4-6 weeks for postpartum visit and 2 hour glucose test. Contact information: [facility] [phone]         Please schedule this patient for Postpartum visit in: 4 weeks with the following provider: Any provider- in person For C/S patients schedule nurse incision check in weeks 2 weeks: no High risk pregnancy complicated by: GDMA2 Delivery mode:  SVD Anticipated Birth Control:  POPs PP Procedures needed: 2 hour GTT  Schedule Integrated [place] visit: no  Newborn Data: Live born female  Birth Weight:  3150 g/6lsb 15.1 oz APGAR: 9, 9  Newborn Delivery   Birth date/time:  04/24/2019 22:30:00 Delivery type: Vaginal, Spontaneous      Baby Feeding: Breast Disposition:home with mother   04/26/2019  Fatima Blank, CNM

## 2019-04-24 NOTE — H&P (Addendum)
OBSTETRIC ADMISSION HISTORY AND PHYSICAL  [name] is a 44 y.o. female [phone] with IUP at 27w0dby LMP/10 week ultrasound presenting for IOL for gDM. She reports +FMs, No LOF, no VB, no blurry vision, headaches or peripheral edema, and RUQ pain or prodromal hsv sx. She plans on breast feeding. She request POPs for birth control. [facility]  Dating: By LMP/10 week u[place]--->  Estimated Date of Delivery: 05/01/19  Sono:    '@[redacted]w[redacted]d' , CWD, normal anatomy, cephalic presentation, [id number] 64% EFW  Prenatal History/Complications: Depression/MDD AMA - multigravida 40+ GDM History of herpes genitalis GBS carrier  Past Medical History: Past Medical History:  Diagnosis Date  . Depression   . Diverticulosis 2018  . Frequent headaches   . Gestational diabetes   . Heart murmur   . History of chicken pox   . Hypertension     Past Surgical History: Past Surgical History:  Procedure Laterality Date  . COLONOSCOPY WITH PROPOFOL N/A 11/28/2016   Procedure: COLONOSCOPY WITH PROPOFOL;  Surgeon: BRobert Bellow MD;  Location: [facility] ENDOSCOPY;  Service: Endoscopy;  Laterality: N/A;  . EXCISIONAL HEMORRHOIDECTOMY  2014    Obstetrical History: OB History     Gravida  4   Para  1   Term  1   Preterm      AB  2   Living  1      SAB  2   TAB      Ectopic      Multiple      Live Births  1        Obstetric Comments  1st Menstrual Cycle:  13  1st Pregnancy:  89        Social History Social History   Socioeconomic History  . Marital status: Divorced    Spouse name: Not on file  . Number of children: Not on file  . Years of education: Not on file  . Highest education level: Not on file  Occupational History  . Not on file  Tobacco Use  . Smoking status: Never Smoker  . Smokeless tobacco: Never Used  Substance and Sexual Activity  . Alcohol use: Not Currently  . Drug use: Never  . Sexual activity: Yes    Birth  control/protection: None  Other Topics Concern  . Not on file  Social History Narrative  . Not on file   Social Determinants of Health   Financial Resource Strain:   . Difficulty of Paying Living Expenses: Not on file  Food Insecurity:   . Worried About RCharity fundraiserin the Last Year: Not on file  . Ran Out of Food in the Last Year: Not on file  Transportation Needs:   . Lack of Transportation (Medical): Not on file  . Lack of Transportation (Non-Medical): Not on file  Physical Activity:   . Days of Exercise per Week: Not on file  . Minutes of Exercise per Session: Not on file  Stress:   . Feeling of Stress : Not on file  Social Connections:   . Frequency of Communication with Friends and Family: Not on file  . Frequency of Social Gatherings with Friends and Family: Not on file  . Attends Religious Services: Not on file  . Active Member of Clubs or Organizations: Not on file  . Attends CArchivistMeetings: Not on file  . Marital Status: Not on file    Family History: Family  History  Problem Relation Age of Onset  . Heart disease Mother        Unsure    Allergies: No Known Allergies  Medications Prior to Admission  Medication Sig Dispense Refill Last Dose  . aspirin EC 81 MG tablet Take 2 tablets (162 mg total) by mouth daily. Take after 12 weeks for prevention of preeclampsia later in pregnancy 300 tablet 2 04/23/2019 at Unknown time  . Calcium Polycarbophil (FIBER-CAPS PO) Take 2 capsules by mouth daily.   04/23/2019 at Unknown time  . FOLIC ACID PO Take 1 tablet by mouth daily.   04/23/2019 at Unknown time  . metFORMIN (GLUCOPHAGE) 500 MG tablet Take 1 tablet (500 mg total) by mouth 2 (two) times daily with a meal. 60 tablet 1 04/23/2019 at Unknown time  . Prenatal Vit-Fe Fumarate-FA (MULTIVITAMIN-PRENATAL) 27-0.8 MG TABS tablet Take 1 tablet by mouth daily at 12 noon.   04/23/2019 at Unknown time  . valACYclovir (VALTREX) 1000 MG tablet Take 1 tablet (1,000 mg  total) by mouth daily. 30 tablet 3 04/23/2019 at Unknown time  . Accu-Chek FastClix Lancets MISC 1 Units by Percutaneous route 4 (four) times daily. 100 each 12   . Blood Glucose Monitoring Suppl (ACCU-CHEK GUIDE) w/Device KIT 1 Device by Does not apply route 4 (four) times daily. 1 kit 0   . glucose blood (ACCU-CHEK GUIDE) test strip Use to check blood sugars four times a day was instructed 50 each 12   . hydrocortisone (ANUSOL-HC) 25 MG suppository Place 1 suppository (25 mg total) rectally 2 (two) times daily. 12 suppository 2   . Misc. Devices (BREAST PUMP) MISC Dispense one breast pump for patient 1 each 0      Review of Systems   All systems reviewed and negative except as stated in HPI  Blood pressure 130/89, pulse 83, temperature 98.4 F (36.9 C), temperature source Oral, resp. rate 16, height '5\' 4"'  (1.626 m), weight 101.8 kg, last menstrual period 07/25/2018. General appearance: alert, cooperative and no distress Lungs: clear to auscultation bilaterally Heart: regular rate and rhythm Abdomen: soft, non-tender; bowel sounds normal Pelvic: no lesions Extremities: Homans sign is negative, no sign of DVT Presentation: cephalic Fetal monitoringBaseline: 140 bpm, Variability: Good {> 6 bpm) and Accelerations: present Uterine activity: feeling some pressure Dilation: 1 Effacement (%): Thick Station: -3 Exam by:: Massie Maroon RN/Briele Williams RN  Prenatal labs: ABO, Rh: --/--/A POS (03/05 0057) Antibody: NEG (03/05 0057) Rubella: 2.21 (08/20 1048) RPR: Non Reactive (12/17 0840)  HBsAg: Negative (08/20 1048)  HIV: Non Reactive (12/17 0840)  GBS: --Tessie Fass (02/11 1555)  Genetic screening  Panorama - low risk, Horizon-negative Anatomy US Normal   Nursing Staff Provider  Office Location  [place] Dating  LMP/10wk [place]  Language  english Anatomy US  Incomplete  Flu Vaccine  declined Genetic Screen  NIPS:  Low risk female AFP: Negative  TDaP vaccine   02/26/2019 Hgb A1C or  GTT  Early  Third trimester  GDM 80/186/130  Rhogam     LAB RESULTS   Feeding Plan Breast Blood Type A/Positive/-- (08/20 1048)   Contraception Undecided Antibody Negative (08/20 1048)  Circumcision n/a Rubella 2.21 (08/20 1048)  Pediatrician  [facility] - Dr. Duard Larsen RPR Non Reactive (08/20 1048)   Support Person  FOB HBsAg Negative (08/20 1048)   Prenatal Classes No HIV Non Reactive (08/20 1048)  BTL Consent  GBS  positive  VBAC Consent  Pap     Hgb Electro  BP Cuff  CF     SMA      Prenatal Transfer Tool  Maternal Diabetes: Yes:  Diabetes Type:  Insulin/Medication controlled Genetic Screening: Normal Maternal Ultrasounds/Referrals: Normal Fetal Ultrasounds or other Referrals:  None Maternal Substance Abuse:  No Significant Maternal Medications:  Meds include: Other:  valtrex, metformin Significant Maternal Lab Results: Group B Strep positive  Results for orders placed or performed during the hospital encounter of 04/24/19 (from the past 24 hour(s))  ABO/Rh   Collection Time: 04/24/19 12:51 AM  Result Value Ref Range   ABO/RH(D)      A POS Performed at [facility], [place] [address] [place], [place] [address]   CBC   Collection Time: 04/24/19 12:57 AM  Result Value Ref Range   WBC 9.2 4.0 - 10.5 K/uL   RBC 4.15 3.87 - 5.11 MIL/uL   Hemoglobin 13.9 12.0 - 15.0 g/dL   HCT 40.4 36.0 - 46.0 %   MCV 97.3 80.0 - 100.0 fL   MCH 33.5 26.0 - 34.0 pg   MCHC 34.4 30.0 - 36.0 g/dL   RDW 12.3 11.5 - 15.5 %   Platelets 189 150 - 400 K/uL   nRBC 0.0 0.0 - 0.2 %  Type and screen   Collection Time: 04/24/19 12:57 AM  Result Value Ref Range   ABO/RH(D) A POS    Antibody Screen NEG    Sample Expiration      04/27/2019,2359 Performed at [facility], [place] [address], [place], [place] [address]   Glucose, capillary   Collection Time: 04/24/19  1:19 AM  Result Value Ref Range   Glucose-Capillary 125 (H) 70 - 99 mg/dL    Patient Active Problem List    Diagnosis Date Noted  . Diabetes in pregnancy 04/24/2019  . Group B Streptococcus carrier, +RV culture, currently pregnant 04/05/2019  . BMI 30s 02/26/2019  . ? History of herpes genitalis 02/26/2019  . GDM (gestational diabetes mellitus) 02/07/2019  . AMA (advanced maternal age) multigravida 57+ 10/09/2018  . Obesity in pregnancy 10/09/2018  . Supervision of high risk pregnancy, antepartum 09/18/2018  . MDD (major depressive disorder) 04/08/2018  . Depression, recurrent ([place]) 10/18/2016    Assessment/Plan:  Graviela Nodal is a 44 y.o. G4P1021 at 39w0dhere for IOL for GDM with a history of genital herpes.  #Labor:Expectant management. Can initiate cytotec/FB/Pitocin as appropriate once adequately treated for GBS #Pain: Wants to attempt without pain meds but with epidural as backup plan if cannot tolerate #FWB: Cat I #ID: GBS positive, PCN #MOF: Breast #MOC:POPs #Circ: N/A  #Hx Genital Herpes: Valtrex ppx dosing #GDM: CBGs q4hr, q2hr in active labor  RLurline Del DO  04/24/2019, 1:57 AM   I personally saw and evaluated the patient, performing the key elements of the service. I developed and verified the management plan that is described in the resident's/student's note, and I agree with the content with my edits above. VSS, HRR&R, Resp unlabored, Legs neg.  FNigel Berthold CNM 04/24/2019 2:14 AM

## 2019-04-24 NOTE — Anesthesia Procedure Notes (Signed)
Epidural Patient location during procedure: OB Start time: 04/24/2019 6:01 PM End time: 04/24/2019 6:16 PM  Staffing Anesthesiologist: Heather Roberts, MD Performed: anesthesiologist   Preanesthetic Checklist Completed: patient identified, IV checked, site marked, risks and benefits discussed, monitors and equipment checked, pre-op evaluation and timeout performed  Epidural Patient position: sitting Prep: DuraPrep Patient monitoring: heart rate, cardiac monitor, continuous pulse ox and blood pressure Approach: midline Location: L2-L3 Injection technique: LOR saline  Needle:  Needle type: Tuohy  Needle gauge: 17 G Needle length: 9 cm Needle insertion depth: 8 cm Catheter size: 20 Guage Catheter at skin depth: 13 cm Test dose: negative and Other  Assessment Events: blood not aspirated, injection not painful, no injection resistance and negative IV test  Additional Notes Informed consent obtained prior to proceeding including risk of failure, 1% risk of PDPH, risk of minor discomfort and bruising.  Discussed rare but serious complications including epidural abscess, permanent nerve injury, epidural hematoma.  Discussed alternatives to epidural analgesia and patient desires to proceed.  Timeout performed pre-procedure verifying patient name, procedure, and platelet count.  Patient tolerated procedure well.

## 2019-04-24 NOTE — Discharge Instructions (Signed)

## 2019-04-24 NOTE — Progress Notes (Signed)
During a brief interview, the patient reports that neither she nor her family members have a history of problems with anesthesia, including MH. Her airway is a MP 3.  BMI 38. Patient has gestational diabetes, on metformin. Pt has had an epidural with a previous delivery with good results. Any patient questions and/or concerns were addressed. She was informed that the anesthesia team is available to her 24/7 if she needs [place] or has further questions. Juan Quam CRNA

## 2019-04-24 NOTE — Progress Notes (Addendum)
Labor Progress Note Kohana Amble is a 44 y.o. [phone] at [redacted]w[redacted]d presented for IOL for GDMA2. S:  Having some pain with epidural.  O:  BP (!) 148/82   Pulse (!) 108   Temp 98.1 F (36.7 C) (Oral)   Resp (P) 18   Ht 5\' 4"  (1.626 m)   Wt 101.8 kg   LMP 07/25/2018 (Exact Date)   SpO2 99%   BMI 38.54 kg/m  EFM: 135/moderate/acels  CVE: Dilation: 7 Effacement (%): 70 Cervical Position: Posterior Station: -2 Presentation: Vertex Exam by:: [email or web address] RN   A&P: 44 y.o. 45 [redacted]w[redacted]d for IOL for GDMA2.  #Labor: Progressing. Can consider pit if labor stalls but patient currently requests to labor on own. #Pain: Epidural #FWB: Cat 1 #GBS negative #GDM: Monitor CBGs, most recent 136. #HTN: Most recent Bps 140s/90. Will check CBC/CMP/protein:cr ratio.  [redacted]w[redacted]d, DO 10:19 PM

## 2019-04-24 NOTE — Progress Notes (Signed)
LABOR PROGRESS NOTE  [name] is a 44 y.o. [id number] at 101w0d  admitted for IOL for GDMA2.  Subjective: Patient doing well. Just finished walking around on the floor. She would still like to do things as naturally as possible.   Objective: BP 131/80   Pulse 76   Temp 97.8 F (36.6 C)   Resp 18   Ht 5\' 4"  (1.626 m)   Wt 101.8 kg   LMP 07/25/2018 (Exact Date)   BMI 38.54 kg/m  or  Vitals:   04/24/19 1140 04/24/19 1230 04/24/19 1449 04/24/19 1520  BP: (!) 122/93 118/72 133/90 131/80  Pulse: 72 77 78 76  Resp: 16   18  Temp:  98.7 F (37.1 C) 97.8 F (36.6 C)   TempSrc:  Oral    Weight:      Height:        Dilation: 6 Effacement (%): 70 Cervical Position: Posterior Station: -2 Presentation: Vertex Exam by:: [email or web address] Rn FHT: baseline rate 155, moderate variability, reactive acel, absent decel Toco: q1-2 min  Labs: Lab Results  Component Value Date   WBC 9.2 04/24/2019   HGB 13.9 04/24/2019   HCT 40.4 04/24/2019   MCV 97.3 04/24/2019   PLT 189 04/24/2019    Patient Active Problem List   Diagnosis Date Noted  . Diabetes in pregnancy 04/24/2019  . Group B Streptococcus carrier, +RV culture, currently pregnant 04/05/2019  . BMI 30s 02/26/2019  . ? History of herpes genitalis 02/26/2019  . GDM (gestational diabetes mellitus) 02/07/2019  . AMA (advanced maternal age) multigravida 29+ 10/09/2018  . Obesity in pregnancy 10/09/2018  . Supervision of high risk pregnancy, antepartum 09/18/2018  . MDD (major depressive disorder) 04/08/2018  . Depression, recurrent ([facility]) 10/18/2016    Assessment / Plan: 44 y.o. 46 at [redacted]w[redacted]d here for IOL for GDMA2 on metformin.  Labor: S/p cytotec x3. Foley bulb placed this morning around 0915 and has already been removed due to dilation. Patient currently contracting well. She would prefer to labor as naturally as possible since she is making good change. She would like to wait on pitocin/AROM and walk around to  see if she can continue progressing on her own.  Fetal Wellbeing:  Category 1 Pain Control:  Would like to labor without pain meds. Epidural as backup if patient unable to tolerate. Received epidural with her previous pregnancy. GBS: Positive, PCN Anticipated MOD:  VD Hx Genital Herpes: valtrex ppx dosing GDMA2: CBG's q4h in latent labor, q2h in active labor  [redacted]w[redacted]d, MD OB Resident 04/24/2019, 4:51 PM

## 2019-04-24 NOTE — Anesthesia Procedure Notes (Signed)
Epidural Patient location during procedure: OB Start time: 04/24/2019 9:59 PM End time: 04/24/2019 10:09 PM  Staffing Anesthesiologist: Heather Roberts, MD Performed: anesthesiologist   Preanesthetic Checklist Completed: patient identified, IV checked, site marked, risks and benefits discussed, monitors and equipment checked, pre-op evaluation and timeout performed  Epidural Patient position: sitting Prep: DuraPrep Patient monitoring: heart rate, cardiac monitor, continuous pulse ox and blood pressure Approach: midline Location: L2-L3 Injection technique: LOR saline  Needle:  Needle type: Tuohy  Needle gauge: 17 G Needle length: 9 cm Needle insertion depth: 7 cm Catheter size: 20 Guage Catheter at skin depth: 12 cm Test dose: negative and Other  Assessment Events: blood not aspirated, injection not painful, no injection resistance and negative IV test  Additional Notes Informed consent obtained prior to proceeding including risk of failure, 1% risk of PDPH, risk of minor discomfort and bruising.  Discussed rare but serious complications including epidural abscess, permanent nerve injury, epidural hematoma.  Discussed alternatives to epidural analgesia and patient desires to proceed.  Timeout performed pre-procedure verifying patient name, procedure, and platelet count.  Patient tolerated procedure well.

## 2019-04-24 NOTE — Progress Notes (Addendum)
LABOR PROGRESS NOTE  [name] is a 44 y.o. [id number] at [redacted]w[redacted]d  admitted for IOL for GDMA2.  Subjective: Patient doing well. She says the epidural is kicking in.   Objective: BP 138/85   Pulse 78   Temp 98.3 F (36.8 C) (Oral)   Resp 18   Ht 5\' 4"  (1.626 m)   Wt 101.8 kg   LMP 07/25/2018 (Exact Date)   SpO2 98%   BMI 38.54 kg/m  or  Vitals:   04/24/19 1831 04/24/19 1835 04/24/19 1840 04/24/19 1846  BP: 132/86 133/88 131/77 138/85  Pulse: 81 82 78 78  Resp:    18  Temp:      TempSrc:      SpO2:  97% 98%   Weight:      Height:        Dilation: 6 Effacement (%): 70 Cervical Position: Posterior Station: -2 Presentation: Vertex Exam by:: [email or web address] RN FHT: baseline rate 140, moderate variability, reactive acel, absent decel Toco: q1-2 min  AROM performed by Dr [employer] on a bulging bag. Small amount of clear fluid was released. Patient tolerated the procedure well.  Labs: Lab Results  Component Value Date   WBC 9.2 04/24/2019   HGB 13.9 04/24/2019   HCT 40.4 04/24/2019   MCV 97.3 04/24/2019   PLT 189 04/24/2019    Patient Active Problem List   Diagnosis Date Noted  . Diabetes in pregnancy 04/24/2019  . Group B Streptococcus carrier, +RV culture, currently pregnant 04/05/2019  . BMI 30s 02/26/2019  . ? History of herpes genitalis 02/26/2019  . GDM (gestational diabetes mellitus) 02/07/2019  . AMA (advanced maternal age) multigravida 48+ 10/09/2018  . Obesity in pregnancy 10/09/2018  . Supervision of high risk pregnancy, antepartum 09/18/2018  . MDD (major depressive disorder) 04/08/2018  . Depression, recurrent ([facility]) 10/18/2016    Assessment / Plan: 44 y.o. 46 at [redacted]w[redacted]d here for IOL for GDMA2 on metformin.  Labor: S/p cytotec x3. Foley bulb placed this morning around 0915 and has already been removed due to dilation. Patient currently contracting well. Since she hadn't made change since the last check, she was okay with AROM which was  performed around 1850.  Fetal Wellbeing:  Category 1 Pain Control:  Epidural GBS: Positive, PCN Anticipated MOD:  VD Hx Genital Herpes: valtrex ppx dosing GDMA2: CBG's q4h in latent labor, q2h in active labor  [redacted]w[redacted]d, MD OB Resident 04/24/2019, 6:56 PM

## 2019-04-24 NOTE — Progress Notes (Signed)
Patient Vitals for the past 4 hrs:  BP Temp Temp src Pulse Resp  04/24/19 0601 115/64 -- -- 77 16  04/24/19 0556 -- 98.1 F (36.7 C) Oral -- --  04/24/19 0501 124/88 -- -- 71 16  04/24/19 0401 111/65 -- -- 76 --  04/24/19 0400 111/65 -- -- 76 15   Blood sugar 77.  FHR Cat 1. Ctx 2-4 minutes, mild.  cx was 1.5/thick/posterior/-3, received 2nd vaginal cytotec around 0500. Try foley placement with next check.

## 2019-04-24 NOTE — Progress Notes (Signed)
LABOR PROGRESS NOTE  [name] is a 44 y.o. [id number] at [redacted]w[redacted]d  admitted for IOL for GDMA2.  Subjective: Patient doing well. Wants to walk around.  Objective: BP 132/82   Pulse 81   Temp 98.1 F (36.7 C) (Oral)   Resp 16   Ht 5\' 4"  (1.626 m)   Wt 101.8 kg   LMP 07/25/2018 (Exact Date)   BMI 38.54 kg/m  or  Vitals:   04/24/19 0601 04/24/19 0700 04/24/19 0800 04/24/19 0900  BP: 115/64 121/79 127/82 132/82  Pulse: 77 76 80 81  Resp: 16 16    Temp:      TempSrc:      Weight:      Height:        Dilation: 1.5 Effacement (%): 50 Cervical Position: Posterior Station: -3 Presentation: Vertex Exam by:: Dr. [email or web address] FHT: baseline rate 140, moderate varibility, reactive acel, absent decel Toco: q3-4  Foley bulb placed @ 0915 using sterile gloves. Balloon inflated with 73mL water. Patient tolerated placement well.  Labs: Lab Results  Component Value Date   WBC 9.2 04/24/2019   HGB 13.9 04/24/2019   HCT 40.4 04/24/2019   MCV 97.3 04/24/2019   PLT 189 04/24/2019    Patient Active Problem List   Diagnosis Date Noted  . Diabetes in pregnancy 04/24/2019  . Group B Streptococcus carrier, +RV culture, currently pregnant 04/05/2019  . BMI 30s 02/26/2019  . ? History of herpes genitalis 02/26/2019  . GDM (gestational diabetes mellitus) 02/07/2019  . AMA (advanced maternal age) multigravida 73+ 10/09/2018  . Obesity in pregnancy 10/09/2018  . Supervision of high risk pregnancy, antepartum 09/18/2018  . MDD (major depressive disorder) 04/08/2018  . Depression, recurrent ([facility]) 10/18/2016    Assessment / Plan: 44 y.o. 46 at [redacted]w[redacted]d here for IOL for GDMA2 on metformin.  Labor: Already received cytotec x2. Placed foley bulb @ 0915. Another dose of cytotec to be given. Plan for AROM/pitocin as indicated.  Fetal Wellbeing:  Category 1 Pain Control:  Would like to labor without pain meds. Epidural as backup if patient unable to tolerate. Received epidural with her  previous pregnancy. GBS: Positive, PCN Anticipated MOD:  VD Hx Genital Herpes: valtrex ppx dosing GDMA2: CBG's q4h in latent labor, q2h in active labor  [redacted]w[redacted]d, MD OB Resident 04/24/2019, 9:16 AM

## 2019-04-25 ENCOUNTER — Encounter (HOSPITAL_COMMUNITY): Payer: Self-pay | Admitting: Family Medicine

## 2019-04-25 LAB — COMPREHENSIVE METABOLIC PANEL
ALT: 16 U/L (ref 0–44)
AST: 22 U/L (ref 15–41)
Albumin: 2.6 g/dL — ABNORMAL LOW (ref 3.5–5.0)
Alkaline Phosphatase: 108 U/L (ref 38–126)
Anion gap: 10 (ref 5–15)
BUN: 5 mg/dL — ABNORMAL LOW (ref 6–20)
CO2: 18 mmol/L — ABNORMAL LOW (ref 22–32)
Calcium: 8.8 mg/dL — ABNORMAL LOW (ref 8.9–10.3)
Chloride: 107 mmol/L (ref 98–111)
Creatinine, Ser: 0.66 mg/dL (ref 0.44–1.00)
GFR calc Af Amer: >60 mL/min (ref >60)
GFR calc non Af Amer: >60 mL/min (ref >60)
Glucose, Bld: 202 mg/dL — ABNORMAL HIGH (ref 70–99)
Potassium: 3.4 mmol/L — ABNORMAL LOW (ref 3.5–5.1)
Sodium: 135 mmol/L (ref 135–145)
Total Bilirubin: 0.8 mg/dL (ref 0.3–1.2)
Total Protein: 5.5 g/dL — ABNORMAL LOW (ref 6.5–8.1)

## 2019-04-25 LAB — CBC
HCT: 38.2 % (ref 36.0–46.0)
Hemoglobin: 13.2 g/dL (ref 12.0–15.0)
MCH: 33.1 pg (ref 26.0–34.0)
MCHC: 34.6 g/dL (ref 30.0–36.0)
MCV: 95.7 fL (ref 80.0–100.0)
Platelets: 172 10*3/uL (ref 150–400)
RBC: 3.99 MIL/uL (ref 3.87–5.11)
RDW: 12.3 % (ref 11.5–15.5)
WBC: 18.2 10*3/uL — ABNORMAL HIGH (ref 4.0–10.5)
nRBC: 0 % (ref 0.0–0.2)

## 2019-04-25 MED ORDER — OXYCODONE HCL 5 MG PO TABS: 5.0000 mg | ORAL_TABLET | ORAL | Status: DC | PRN

## 2019-04-25 MED ORDER — SIMETHICONE 80 MG PO CHEW: 80.0000 mg | CHEWABLE_TABLET | ORAL | Status: DC | PRN

## 2019-04-25 MED ORDER — WITCH HAZEL-GLYCERIN EX PADS: 1.0000 "application " | MEDICATED_PAD | CUTANEOUS | Status: DC | PRN

## 2019-04-25 MED ORDER — PRENATAL MULTIVITAMIN CH
1.0000 | ORAL_TABLET | Freq: Every day | ORAL | Status: DC
  Administered 2019-04-25: 1 via ORAL
  Filled 2019-04-25: qty 1

## 2019-04-25 MED ORDER — OXYCODONE HCL 5 MG PO TABS: 10.0000 mg | ORAL_TABLET | ORAL | Status: DC | PRN

## 2019-04-25 MED ORDER — DIBUCAINE (PERIANAL) 1 % EX OINT: 1.0000 "application " | TOPICAL_OINTMENT | CUTANEOUS | Status: DC | PRN

## 2019-04-25 MED ORDER — TETANUS-DIPHTH-ACELL PERTUSSIS 5-2.5-18.5 LF-MCG/0.5 IM SUSP: 0.5000 mL | Freq: Once | INTRAMUSCULAR | Status: DC

## 2019-04-25 MED ORDER — SENNOSIDES-DOCUSATE SODIUM 8.6-50 MG PO TABS
2.0000 | ORAL_TABLET | ORAL | Status: DC
  Administered 2019-04-26: 2 via ORAL
  Filled 2019-04-25: qty 2

## 2019-04-25 MED ORDER — ACETAMINOPHEN 325 MG PO TABS: 650.0000 mg | ORAL_TABLET | ORAL | Status: DC | PRN

## 2019-04-25 MED ORDER — IBUPROFEN 600 MG PO TABS
600.0000 mg | ORAL_TABLET | Freq: Four times a day (QID) | ORAL | Status: DC
  Administered 2019-04-25 – 2019-04-26 (×6): 600 mg via ORAL
  Filled 2019-04-25 (×6): qty 1

## 2019-04-25 MED ORDER — ONDANSETRON HCL 4 MG/2ML IJ SOLN: 4.0000 mg | INTRAMUSCULAR | Status: DC | PRN

## 2019-04-25 MED ORDER — BENZOCAINE-MENTHOL 20-0.5 % EX AERO
1.0000 "application " | INHALATION_SPRAY | CUTANEOUS | Status: DC | PRN
  Administered 2019-04-25: 1 via TOPICAL
  Filled 2019-04-25: qty 56

## 2019-04-25 MED ORDER — DIPHENHYDRAMINE HCL 25 MG PO CAPS: 25.0000 mg | ORAL_CAPSULE | Freq: Four times a day (QID) | ORAL | Status: DC | PRN

## 2019-04-25 MED ORDER — COCONUT OIL OIL: 1.0000 "application " | TOPICAL_OIL | Status: DC | PRN

## 2019-04-25 MED ORDER — ONDANSETRON HCL 4 MG PO TABS: 4.0000 mg | ORAL_TABLET | ORAL | Status: DC | PRN

## 2019-04-25 NOTE — Progress Notes (Addendum)
POSTPARTUM PROGRESS NOTE  Subjective: [name] is a 44 y.o. [phone] s/p SVD with IOL for gDM at [redacted]w[redacted]d.  She reports she doing well. No acute events overnight. She denies any problems with ambulating, voiding or po intake. Denies nausea or vomiting. Pain is well controlled.  Lochia is similar to menses.  Objective: Blood pressure 135/90, pulse 97, temperature 98.1 F (36.7 C), temperature source Oral, resp. rate 18, height 5\' 4"  (1.626 m), weight 101.8 kg, last menstrual period 07/25/2018, SpO2 95 %, unknown if currently breastfeeding.  Physical Exam:  General: alert, cooperative and no distress Chest: no respiratory distress Uterine Fundus: firm, appropriately tender  Recent Labs    04/24/19 0057 04/25/19 0207  HGB 13.9 13.2  HCT 40.4 38.2    Assessment/Plan: [name] is a 45 y.o. 46 s/p SVD at [redacted]w[redacted]d for IOL for gDM.  Routine Postpartum Care: Doing well, pain well-controlled.  -- Continue routine care, lactation support  -- Contraception: POPs -- Feeding: Breast -- HTN: Pressures at the end of labor course elevated in the 140s, now 122/83. Will continue to monitor, consider antihypertensive if systolic >130 in subsequent readings. -- GDM - AM CBG 136. Repeat 2 hour GTT at post partum appointment.  Dispo: Plan for discharge tomorrow.  [redacted]w[redacted]d, DO Resident, [facility]  GME ATTESTATION:  I saw and evaluated the patient. I agree with the findings and the plan of care as documented in the resident's note.  Jackelyn Poling, DO OB Fellow, Faculty [facility], [facility] 04/25/2019 7:40 AM

## 2019-04-25 NOTE — Lactation Note (Signed)
This note was copied from a baby's chart. Lactation Consultation Note  Patient Name: [name] IFOYD'X Date: 04/25/2019 Reason for consult: Initial assessment;1st time breastfeeding;Term;Primapara P2. First baby would not latch. Baby is 66 hours old and has not latched to breast.  Baby has been spitty.  Mom would like a feeding assist.  Positioned baby in football hold skin to skin.  Instructed on hand expression but no colostrum seen.  Nipples are short shafted and invert slightly with compression.  Baby opens wide and sucks a few times but unable to sustain a latch.  Breast shells given with instructions.  Symphony pump set up and initiated.  Instructed to feed with cues and post pump every 3 hours x 15 minutes.  Mom will finger feed colostrum if obtained.  Nipple shield may be needed if shells and pumping not helpful.  Breastfeeding consultation services information given and reviewed.  Maternal Data Has patient been taught Hand Expression?: Yes Does the patient have breastfeeding experience prior to this delivery?: No  Feeding Feeding Type: Breast Fed  LATCH Score Latch: Repeated attempts needed to sustain latch, nipple held in mouth throughout feeding, stimulation needed to elicit sucking reflex.  Audible Swallowing: None  Type of Nipple: Everted at rest and after stimulation(short shafted)  Comfort (Breast/Nipple): Soft / non-tender  Hold (Positioning): Assistance needed to correctly position infant at breast and maintain latch.  LATCH Score: 6  Interventions Interventions: Breast feeding basics reviewed;Breast compression;Assisted with latch;Adjust position;Hand pump;DEBP;Skin to skin;Support pillows;Breast massage;Hand express;Shells  Lactation Tools Discussed/Used Tools: Shells Shell Type: Inverted Breast pump type: Double-Electric Breast Pump Pump Review: Setup, frequency, and cleaning;Milk Storage Initiated by:: [email or web address] Date initiated::  04/25/19   Consult Status Consult Status: Follow-up Date: 04/26/19 Follow-up type: In-patient    [name] 04/25/2019, 10:46 AM

## 2019-04-25 NOTE — Lactation Note (Signed)
This note was copied from a baby's chart. Lactation Consultation Note  Patient Name: Girl Nana Vastine NGFRE'V Date: 04/25/2019  P2, 6 hour female term infant. LC entered room, mom asleep dad on couch with infant. Per dad, mom is tired,  infant has been very spitty and he has been suctioning him while mom is sleeping. LC discussed that LC services would come back and visit mom and infant later today.    Maternal Data    Feeding Feeding Type: Breast Fed  LATCH Score Latch: Too sleepy or reluctant, no latch achieved, no sucking elicited.(unable to hand express)  Audible Swallowing: None  Type of Nipple: Everted at rest and after stimulation(very short shafted )  Comfort (Breast/Nipple): Soft / non-tender  Hold (Positioning): Assistance needed to correctly position infant at breast and maintain latch.  LATCH Score: 5  Interventions    Lactation Tools Discussed/Used Tools: Pump Breast pump type: Manual   Consult Status      Danelle Earthly 04/25/2019, 4:49 AM

## 2019-04-25 NOTE — Plan of Care (Signed)
  Problem: Activity: Goal: Risk for activity intolerance will decrease Outcome: Completed/Met   Problem: Pain Managment: Goal: General experience of comfort will improve Outcome: Completed/Met   Problem: Safety: Goal: Ability to remain free from injury will improve Outcome: Completed/Met

## 2019-04-26 ENCOUNTER — Encounter (HOSPITAL_COMMUNITY): Payer: Self-pay | Admitting: Family Medicine

## 2019-04-26 MED ORDER — NORETHINDRONE 0.35 MG PO TABS: 1.0000 | ORAL_TABLET | Freq: Every day | ORAL | 11 refills | Status: DC

## 2019-04-26 MED ORDER — IBUPROFEN 600 MG PO TABS: 600.0000 mg | ORAL_TABLET | Freq: Four times a day (QID) | ORAL | 0 refills | Status: DC

## 2019-04-26 NOTE — Lactation Note (Signed)
This note was copied from a baby's chart. Lactation Consultation Note  Patient Name: [name] [id number] Date: 04/26/2019 Reason for consult: Follow-up assessment;Difficult latch Baby is 35 hours/6% weight loss.  Mom feels she has a good plan.  She allows baby to latch with 24 mm nipple shield, post pumps every 3 hours and then supplements with formula using a slow flow nipple.  Discussed milk coming to volume and the prevention and treatment of engorgement.  Mom has a breast pump at home.  Recommended an outpatient appointment for latch and feeding assessment once milk comes to volume.  Answered questions and encouraged to call prn.  Maternal Data    Feeding    LATCH Score                   Interventions    Lactation Tools Discussed/Used     Consult Status Consult Status: Complete Follow-up type: Call as needed    [name] 04/26/2019, 9:46 AM

## 2019-04-26 NOTE — Plan of Care (Signed)
  Problem: Education: Goal: Knowledge of Childbirth will improve Outcome: Completed/Met Goal: Ability to make informed decisions regarding treatment and plan of care will improve Outcome: Completed/Met Goal: Ability to state and carry out methods to decrease the pain will improve Outcome: Completed/Met Goal: Individualized Educational Video(s) Outcome: Completed/Met   Problem: Coping: Goal: Ability to verbalize concerns and feelings about labor and delivery will improve Outcome: Completed/Met   Problem: Life Cycle: Goal: Ability to make normal progression through stages of labor will improve Outcome: Completed/Met Goal: Ability to effectively push during vaginal delivery will improve Outcome: Completed/Met   Problem: Role Relationship: Goal: Will demonstrate positive interactions with the child Outcome: Completed/Met   Problem: Safety: Goal: Risk of complications during labor and delivery will decrease Outcome: Completed/Met   Problem: Pain Management: Goal: Relief or control of pain from uterine contractions will improve Outcome: Completed/Met   Problem: Education: Goal: Knowledge of condition will improve Outcome: Completed/Met Goal: Individualized Educational Video(s) Outcome: Completed/Met Goal: Individualized Newborn Educational Video(s) Outcome: Completed/Met   Problem: Activity: Goal: Will verbalize the importance of balancing activity with adequate rest periods Outcome: Completed/Met Goal: Ability to tolerate increased activity will improve Outcome: Completed/Met   Problem: Coping: Goal: Ability to identify and utilize available resources and services will improve Outcome: Completed/Met   Problem: Life Cycle: Goal: Chance of risk for complications during the postpartum period will decrease Outcome: Completed/Met   Problem: Role Relationship: Goal: Ability to demonstrate positive interaction with newborn will improve Outcome: Completed/Met   Problem:  Skin Integrity: Goal: Demonstration of wound healing without infection will improve Outcome: Completed/Met   Problem: Education: Goal: Knowledge of General Education information will improve Description: Including pain rating scale, medication(s)/side effects and non-pharmacologic comfort measures Outcome: Completed/Met   Problem: Health Behavior/Discharge Planning: Goal: Ability to manage health-related needs will improve Outcome: Completed/Met   Problem: Clinical Measurements: Goal: Ability to maintain clinical measurements within normal limits will improve Outcome: Completed/Met Goal: Will remain free from infection Outcome: Completed/Met Goal: Diagnostic test results will improve Outcome: Completed/Met Goal: Respiratory complications will improve Outcome: Completed/Met Goal: Cardiovascular complication will be avoided Outcome: Completed/Met   Problem: Nutrition: Goal: Adequate nutrition will be maintained Outcome: Completed/Met   Problem: Coping: Goal: Level of anxiety will decrease Outcome: Completed/Met   Problem: Elimination: Goal: Will not experience complications related to bowel motility Outcome: Completed/Met Goal: Will not experience complications related to urinary retention Outcome: Completed/Met   Problem: Skin Integrity: Goal: Risk for impaired skin integrity will decrease Outcome: Completed/Met

## 2019-04-26 NOTE — Addendum Note (Signed)
Addendum  created 04/26/19 1749 by Elmer Picker, MD   Clinical Note Signed

## 2019-04-26 NOTE — Lactation Note (Addendum)
This note was copied from a baby's chart. Lactation Consultation Note  Patient Name: [name] AIDKS'M Date: 04/26/2019  P2, 30 hour female infant. Mom's feeding choice is breast and formula feeding infant. RN is pace feeding infant formula using slow flow bottle nipple. Per dad, mom has been using DEBP as advised by [facility] services. Infant is starting to latch at breast with 24 mm NS and starting make improvement with latching at breast. LC did not talk with mom, mom is asleep at this time and dad is engaging with RN regarding infant feeding concerns. LC discussed that LC services will be back later today to follow up with mom and assess latch.   Maternal Data    Feeding Feeding Type: Formula Nipple Type: Slow - flow  LATCH Score                   Interventions    Lactation Tools Discussed/Used     Consult Status      Danelle Earthly 04/26/2019, 4:42 AM

## 2019-04-26 NOTE — Anesthesia Postprocedure Evaluation (Signed)
Anesthesia Post Note  Patient: [name]  Procedure(s) Performed: AN AD HOC LABOR EPIDURAL     Patient location during evaluation: Mother Baby Anesthesia Type: Epidural Level of consciousness: awake and alert Pain management: pain level controlled Vital Signs Assessment: post-procedure vital signs reviewed and stable Respiratory status: spontaneous breathing, nonlabored ventilation and respiratory function stable Cardiovascular status: stable Postop Assessment: no headache, no backache and epidural receding Anesthetic complications: no Comments: Spoke with patient regarding epidural experience. Pt states she was comfortable for 2 hours with the first epidural; however, she became more uncomfortable once she progressed to active labor. The decision was made to replace the epidural. I suspect she made such rapid cervical change once she progressed into active labor that the second epidural never fully set up before she delivered. I explained my suspicions to the patient, and she voiced understanding. In addition, she reported pain at the epidural site in addition to pain in her lower left back/flank. I explained these symptoms can happen after epidural placement and could also be a result of labor. I reassured her that the should resolve. I educated her on concerning symptoms and provided anesthesia contact information should she have any further concerns or questions.     Last Vitals:  Vitals:   04/25/19 1930 04/26/19 0408  BP: 117/82 109/73  Pulse: 72 70  Resp: 18 18  Temp: 36.7 C 36.5 C  SpO2:      Last Pain:  Vitals:   04/26/19 0817  TempSrc:   PainSc: 0-No pain   Pain Goal:                   Darryle Dennie L Hajar Penninger
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
## 2019-04-26 NOTE — Anesthesia Postprocedure Evaluation (Signed)
Anesthesia Post Note  Patient: [name]  Procedure(s) Performed: AN AD HOC LABOR EPIDURAL     Patient location during evaluation: Mother Baby Anesthesia Type: Epidural Level of consciousness: awake and alert, oriented and patient cooperative Pain management: pain level controlled Vital Signs Assessment: post-procedure vital signs reviewed and stable Respiratory status: spontaneous breathing Cardiovascular status: stable Postop Assessment: no headache, epidural receding, patient able to bend at knees and no signs of nausea or vomiting Anesthetic complications: no Comments: Pt.  States multiple epidural failures and pt. discussed with [name]. Pain score 2.     Last Vitals:  Vitals:   04/25/19 1930 04/26/19 0408  BP: 117/82 109/73  Pulse: 72 70  Resp: 18 18  Temp: 36.7 C 36.5 C  SpO2:      Last Pain:  Vitals:   04/26/19 0408  TempSrc: Oral  PainSc:    Pain Goal:                   [facility]

## 2019-05-14 DIAGNOSIS — F411 Generalized anxiety disorder: Secondary | ICD-10-CM | POA: Diagnosis not present

## 2019-05-28 ENCOUNTER — Ambulatory Visit: Payer: BC Managed Care – PPO | Admitting: Obstetrics & Gynecology

## 2019-06-01 DIAGNOSIS — M9905 Segmental and somatic dysfunction of pelvic region: Secondary | ICD-10-CM | POA: Diagnosis not present

## 2019-06-01 DIAGNOSIS — M5416 Radiculopathy, lumbar region: Secondary | ICD-10-CM | POA: Diagnosis not present

## 2019-06-01 DIAGNOSIS — M9903 Segmental and somatic dysfunction of lumbar region: Secondary | ICD-10-CM | POA: Diagnosis not present

## 2019-06-01 DIAGNOSIS — M62830 Muscle spasm of back: Secondary | ICD-10-CM | POA: Diagnosis not present

## 2019-06-05 DIAGNOSIS — F411 Generalized anxiety disorder: Secondary | ICD-10-CM | POA: Diagnosis not present

## 2019-06-11 ENCOUNTER — Ambulatory Visit (INDEPENDENT_AMBULATORY_CARE_PROVIDER_SITE_OTHER): Payer: BC Managed Care – PPO | Admitting: Obstetrics & Gynecology

## 2019-06-11 ENCOUNTER — Encounter: Payer: Self-pay | Admitting: Obstetrics & Gynecology

## 2019-06-11 ENCOUNTER — Other Ambulatory Visit: Payer: Self-pay

## 2019-06-11 DIAGNOSIS — Z1231 Encounter for screening mammogram for malignant neoplasm of breast: Secondary | ICD-10-CM

## 2019-06-11 DIAGNOSIS — Z8632 Personal history of gestational diabetes: Secondary | ICD-10-CM

## 2019-06-11 DIAGNOSIS — O24415 Gestational diabetes mellitus in pregnancy, controlled by oral hypoglycemic drugs: Secondary | ICD-10-CM | POA: Diagnosis not present

## 2019-06-11 NOTE — Progress Notes (Signed)
    Post Partum Visit Note  [name] is a 44 y.o. [phone] female who presents for a postpartum visit. She is 7 weeks postpartum following a normal spontaneous vaginal delivery.  I have fully reviewed the prenatal and intrapartum course. The delivery was at 39.0 gestational weeks, patient has GDM.  Anesthesia: epidural. Postpartum course has been uncomplicatoned. Baby is doing well. Baby is feeding by both breast and bottle - Similac Sensitive RS. No bleeding. Bowel function is normal. Bladder function is normal. Patient is not sexually active. Contraception method is oral progesterone-only contraceptive. Postpartum depression screening: negative.  The following portions of the patient's history were reviewed and updated as appropriate: allergies, current medications, past family history, past medical history, past social history, past surgical history and problem list.  Normal pap and negative HPV on 10/09/2018.  Review of Systems Pertinent items noted in HPI and remainder of comprehensive ROS otherwise negative.    Objective:  Blood pressure 129/67, pulse (!) 59, weight 198 lb (89.8 kg), unknown if currently breastfeeding.  Done in the presence of a chaperone. General:  alert and no distress   Breasts:  inspection negative, no nipple discharge or bleeding, no masses or nodularity palpable  Lungs: clear to auscultation bilaterally  Heart:  regular rate and rhythm  Abdomen: soft, non-tender; bowel sounds normal; no masses,  no organomegaly   Pelvic:  not evaluated        Assessment:    Normal postpartum exam.    Plan:   Essential components of care per ACOG recommendations:  1.  Mood and well being: Patient with negative depression screening today. Reviewed local resources for support.  - Patient does not use tobacco.  - hx of drug use? No  2. Infant care and feeding:  -Patient currently breastmilk feeding? Yes  Reviewed importance of draining breast regularly to support  lactation. -Social determinants of health (SDOH) reviewed in EPIC. No concerns.  3. Sexuality, contraception and birth spacing - Patient does not want a pregnancy in the next year.  Desired family size is maybe 3 children.  - Reviewed forms of contraception in tiered fashion. Patient desired oral progesterone-only contraceptive today.   - Discussed birth spacing of 18 months  4. Sleep and fatigue -Encouraged family/partner/community support of 4 hrs of uninterrupted sleep to help with mood and fatigue  5. Physical Recovery  - Discussed patient's delivery, had no lacerations. - Patient has urinary incontinence? No - Patient is safe to resume physical and sexual activity  6.  Health Maintenance - Last pap smear done 10/09/2018 and was normal with negative HPV. - Mammogram was done in 10/31/2017, needs another mammogram later this year and this was ordered.    7. History of GDM - 2 hr GTT being done today, will follow up results and manage accordingly. - PCP follow up as needed   Jaynie Collins, MD [facility], [facility]

## 2019-06-11 NOTE — Patient Instructions (Signed)
Return to clinic for any scheduled appointments or for any gynecologic concerns as needed.   

## 2019-06-12 ENCOUNTER — Encounter: Payer: Self-pay | Admitting: Obstetrics & Gynecology

## 2019-06-12 LAB — GLUCOSE TOLERANCE, 2 HOURS
Glucose, 2 hour: 86 mg/dL (ref 65–139)
Glucose, GTT - Fasting: 85 mg/dL (ref 65–99)

## 2019-06-29 DIAGNOSIS — M5416 Radiculopathy, lumbar region: Secondary | ICD-10-CM | POA: Diagnosis not present

## 2019-06-29 DIAGNOSIS — M9905 Segmental and somatic dysfunction of pelvic region: Secondary | ICD-10-CM | POA: Diagnosis not present

## 2019-06-29 DIAGNOSIS — M9903 Segmental and somatic dysfunction of lumbar region: Secondary | ICD-10-CM | POA: Diagnosis not present

## 2019-06-29 DIAGNOSIS — M62830 Muscle spasm of back: Secondary | ICD-10-CM | POA: Diagnosis not present

## 2019-06-30 DIAGNOSIS — F411 Generalized anxiety disorder: Secondary | ICD-10-CM | POA: Diagnosis not present

## 2019-07-19 ENCOUNTER — Telehealth: Payer: Self-pay | Admitting: Obstetrics and Gynecology

## 2019-07-19 NOTE — Telephone Encounter (Signed)
GYN Telephone Note BC options d/w her and I told her I don't recommend any estrogen options. Pt liking the aygestin and okay to stay on that.

## 2019-07-30 DIAGNOSIS — F411 Generalized anxiety disorder: Secondary | ICD-10-CM | POA: Diagnosis not present

## 2019-08-13 DIAGNOSIS — F411 Generalized anxiety disorder: Secondary | ICD-10-CM | POA: Diagnosis not present

## 2019-08-20 DIAGNOSIS — F411 Generalized anxiety disorder: Secondary | ICD-10-CM | POA: Diagnosis not present

## 2019-08-28 DIAGNOSIS — M9905 Segmental and somatic dysfunction of pelvic region: Secondary | ICD-10-CM | POA: Diagnosis not present

## 2019-08-28 DIAGNOSIS — M5416 Radiculopathy, lumbar region: Secondary | ICD-10-CM | POA: Diagnosis not present

## 2019-08-28 DIAGNOSIS — M62830 Muscle spasm of back: Secondary | ICD-10-CM | POA: Diagnosis not present

## 2019-08-28 DIAGNOSIS — M9903 Segmental and somatic dysfunction of lumbar region: Secondary | ICD-10-CM | POA: Diagnosis not present

## 2019-09-03 DIAGNOSIS — F411 Generalized anxiety disorder: Secondary | ICD-10-CM | POA: Diagnosis not present

## 2019-09-14 DIAGNOSIS — M5416 Radiculopathy, lumbar region: Secondary | ICD-10-CM | POA: Diagnosis not present

## 2019-09-14 DIAGNOSIS — M62830 Muscle spasm of back: Secondary | ICD-10-CM | POA: Diagnosis not present

## 2019-09-14 DIAGNOSIS — M9903 Segmental and somatic dysfunction of lumbar region: Secondary | ICD-10-CM | POA: Diagnosis not present

## 2019-09-14 DIAGNOSIS — M9905 Segmental and somatic dysfunction of pelvic region: Secondary | ICD-10-CM | POA: Diagnosis not present

## 2019-09-17 DIAGNOSIS — F411 Generalized anxiety disorder: Secondary | ICD-10-CM | POA: Diagnosis not present

## 2019-10-28 DIAGNOSIS — M9903 Segmental and somatic dysfunction of lumbar region: Secondary | ICD-10-CM | POA: Diagnosis not present

## 2019-10-28 DIAGNOSIS — M62830 Muscle spasm of back: Secondary | ICD-10-CM | POA: Diagnosis not present

## 2019-10-28 DIAGNOSIS — M9905 Segmental and somatic dysfunction of pelvic region: Secondary | ICD-10-CM | POA: Diagnosis not present

## 2019-10-28 DIAGNOSIS — M5416 Radiculopathy, lumbar region: Secondary | ICD-10-CM | POA: Diagnosis not present

## 2019-11-02 DIAGNOSIS — M222X2 Patellofemoral disorders, left knee: Secondary | ICD-10-CM | POA: Diagnosis not present

## 2019-11-05 DIAGNOSIS — M9905 Segmental and somatic dysfunction of pelvic region: Secondary | ICD-10-CM | POA: Diagnosis not present

## 2019-11-05 DIAGNOSIS — M62830 Muscle spasm of back: Secondary | ICD-10-CM | POA: Diagnosis not present

## 2019-11-05 DIAGNOSIS — M9903 Segmental and somatic dysfunction of lumbar region: Secondary | ICD-10-CM | POA: Diagnosis not present

## 2019-11-05 DIAGNOSIS — M5416 Radiculopathy, lumbar region: Secondary | ICD-10-CM | POA: Diagnosis not present

## 2019-11-17 DIAGNOSIS — Z1231 Encounter for screening mammogram for malignant neoplasm of breast: Secondary | ICD-10-CM | POA: Diagnosis not present

## 2019-11-19 ENCOUNTER — Encounter: Payer: Self-pay | Admitting: Radiology

## 2020-03-21 ENCOUNTER — Encounter: Payer: Self-pay | Admitting: Internal Medicine

## 2020-03-22 ENCOUNTER — Other Ambulatory Visit: Payer: Self-pay

## 2020-03-22 ENCOUNTER — Encounter: Payer: Self-pay | Admitting: Internal Medicine

## 2020-03-22 ENCOUNTER — Telehealth (INDEPENDENT_AMBULATORY_CARE_PROVIDER_SITE_OTHER): Payer: Medicaid Other | Admitting: Internal Medicine

## 2020-03-22 DIAGNOSIS — J029 Acute pharyngitis, unspecified: Secondary | ICD-10-CM | POA: Diagnosis not present

## 2020-03-22 DIAGNOSIS — U071 COVID-19: Secondary | ICD-10-CM

## 2020-03-22 DIAGNOSIS — R059 Cough, unspecified: Secondary | ICD-10-CM

## 2020-03-22 MED ORDER — PREDNISONE 10 MG PO TABS: ORAL_TABLET | ORAL | 0 refills | Status: DC

## 2020-03-22 MED ORDER — ALBUTEROL SULFATE HFA 108 (90 BASE) MCG/ACT IN AERS: 1.0000 | INHALATION_SPRAY | Freq: Four times a day (QID) | RESPIRATORY_TRACT | 0 refills | Status: DC | PRN

## 2020-03-22 NOTE — Patient Instructions (Signed)
COVID-19 Quarantine vs. Isolation QUARANTINE keeps someone who was in close contact with someone who has COVID-19 away from others. Quarantine if you have been in close contact with someone who has COVID-19, unless you have been fully vaccinated. If you are fully vaccinated  You do NOT need to quarantine unless they have symptoms  Get tested 3-5 days after your exposure, even if you don't have symptoms  Wear a mask indoors in public for 14 days following exposure or until your test result is negative If you are not fully vaccinated  Stay home for 14 days after your last contact with a person who has COVID-19  Watch for fever (100.4F), cough, shortness of breath, or other symptoms of COVID-19  If possible, stay away from people you live with, especially people who are at higher risk for getting very sick from COVID-19  Contact your local public health department for options in your area to possibly shorten your quarantine ISOLATION keeps someone who is sick or tested positive for COVID-19 without symptoms away from others, even in their own home. People who are in isolation should stay home and stay in a specific "sick room" or area and use a separate bathroom (if available). If you are sick and think or know you have COVID-19 Stay home until after  At least 10 days since symptoms first appeared and  At least 24 hours with no fever without the use of fever-reducing medications and  Symptoms have improved If you tested positive for COVID-19 but do not have symptoms  Stay home until after 10 days have passed since your positive viral test  If you develop symptoms after testing positive, follow the steps above for those who are sick cdc.gov/coronavirus 11/16/2019 This information is not intended to replace advice given to you by your health care provider. Make sure you discuss any questions you have with your health care provider. Document Revised: 12/21/2019 Document Reviewed:  12/21/2019 Elsevier Patient Education  2021 [employer]  

## 2020-03-22 NOTE — Progress Notes (Signed)
Virtual Visit via Video Note  I connected with [name] on 03/22/20 at  3:45 PM EST by a video enabled telemedicine application and verified that I am speaking with the correct person using two identifiers.  Location: Patient: Home Provider: Office  Person's participating in this video call: Nicki Reaper, NP and [name]   I discussed the limitations of evaluation and management by telemedicine and the availability of in person appointments. The patient expressed understanding and agreed to proceed.  History of Present Illness:  Pt reports, nasal congestion, scratchy throat and cough. This started 2 weeks ago after testing positive for Covid. She denies difficulty swallowing. The cough is dry and nonproductive. She denies runny nose, ear pain, sore throat, loss of taste/smell or SOB. She denies fever, chills or body aches. She has tried cough drops and Dayquil OTC with minimal relief of symptoms.    Past Medical History:  Diagnosis Date  . Depression   . Diverticulosis 2018  . Frequent headaches   . Gestational diabetes    Negative 2 hr GTT postpartum on 06/11/2019  . Heart murmur   . History of chicken pox     Current Outpatient Medications  Medication Sig Dispense Refill  . Calcium Polycarbophil (FIBER-CAPS PO) Take 2 capsules by mouth daily.    . hydrocortisone (ANUSOL-HC) 25 MG suppository Place 1 suppository (25 mg total) rectally 2 (two) times daily. 12 suppository 2  . ibuprofen (ADVIL) 600 MG tablet Take 1 tablet (600 mg total) by mouth every 6 (six) hours. 30 tablet 0  . Misc. Devices (BREAST PUMP) MISC Dispense one breast pump for patient 1 each 0  . norethindrone (MICRONOR) 0.35 MG tablet Take 1 tablet (0.35 mg total) by mouth daily. (Patient not taking: Reported on 06/11/2019) 1 Package 11  . Prenatal Vit-Fe Fumarate-FA (PRENATAL MULTIVITAMIN) TABS tablet Take 1 tablet by mouth daily at 12 noon.     No current facility-administered medications  for this visit.    No Known Allergies  Family History  Problem Relation Age of Onset  . Heart disease Mother        Unsure    Social History   Socioeconomic History  . Marital status: Divorced    Spouse name: Not on file  . Number of children: Not on file  . Years of education: Not on file  . Highest education level: Not on file  Occupational History  . Not on file  Tobacco Use  . Smoking status: Never Smoker  . Smokeless tobacco: Never Used  Vaping Use  . Vaping Use: Never used  Substance and Sexual Activity  . Alcohol use: Not Currently  . Drug use: Never  . Sexual activity: Yes    Birth control/protection: None  Other Topics Concern  . Not on file  Social History Narrative  . Not on file   Social Determinants of Health   Financial Resource Strain: Not on file  Food Insecurity: Not on file  Transportation Needs: Not on file  Physical Activity: Not on file  Stress: Not on file  Social Connections: Not on file  Intimate Partner Violence: Not on file     Constitutional: Denies fever, malaise, fatigue, headache or abrupt weight changes.  HEENT: Pt reports scratchy throat. Denies eye pain, eye redness, ear pain, ringing in the ears, wax buildup, runny nose, nasal congestion, bloody nose, or sore throat. Respiratory: Pt reports cough. Denies difficulty breathing, shortness of breath, or sputum production.   Cardiovascular: Denies chest pain, chest  tightness, palpitations or swelling in the hands or feet.  Gastrointestinal: Denies abdominal pain, bloating, constipation, diarrhea or blood in the stool.  Neurological: Denies dizziness, difficulty with memory, difficulty with speech or problems with balance and coordination.    No other specific complaints in a complete review of systems (except as listed in HPI above).  Observations/Objective:   Wt Readings from Last 3 Encounters:  06/11/19 198 lb (89.8 kg)  04/24/19 224 lb 8 oz (101.8 kg)  04/21/19 218 lb  12.8 oz (99.2 kg)    General: Appears her stated age, in NAD. HEENT: Head: normal shape and size; Nose: no congestion noted ; Throat/Mouth: hoarseness noted Pulmonary/Chest: Normal effort. No respiratory distress. Dry cough noted. Neurological: Alert and oriented.   BMET    Component Value Date/Time   NA 135 04/25/2019 0207   NA 138 02/26/2019 1052   NA 140 06/04/2014 1335   K 3.4 (L) 04/25/2019 0207   K 4.2 06/04/2014 1335   CL 107 04/25/2019 0207   CL 107 06/04/2014 1335   CO2 18 (L) 04/25/2019 0207   CO2 27 06/04/2014 1335   GLUCOSE 202 (H) 04/25/2019 0207   GLUCOSE 106 (H) 06/04/2014 1335   BUN 5 (L) 04/25/2019 0207   BUN 6 02/26/2019 1052   BUN 9 06/04/2014 1335   CREATININE 0.66 04/25/2019 0207   CREATININE 0.60 06/04/2014 1335   CALCIUM 8.8 (L) 04/25/2019 0207   CALCIUM 9.4 06/04/2014 1335   GFRNONAA >60 04/25/2019 0207   GFRNONAA >60 06/04/2014 1335   GFRAA >60 04/25/2019 0207   GFRAA >60 06/04/2014 1335    Lipid Panel  No results found for: CHOL, TRIG, HDL, CHOLHDL, VLDL, LDLCALC  CBC    Component Value Date/Time   WBC 18.2 (H) 04/25/2019 0207   RBC 3.99 04/25/2019 0207   HGB 13.2 04/25/2019 0207   HGB 13.3 02/05/2019 0840   HCT 38.2 04/25/2019 0207   HCT 38.7 02/05/2019 0840   PLT 172 04/25/2019 0207   PLT 211 02/05/2019 0840   MCV 95.7 04/25/2019 0207   MCV 95 02/05/2019 0840   MCV 91 06/04/2014 1335   MCH 33.1 04/25/2019 0207   MCHC 34.6 04/25/2019 0207   RDW 12.3 04/25/2019 0207   RDW 11.8 02/05/2019 0840   RDW 12.6 06/04/2014 1335   LYMPHSABS 1.3 10/09/2018 1048   LYMPHSABS 2.0 06/04/2014 1335   MONOABS 0.8 05/31/2016 1546   MONOABS 0.5 06/04/2014 1335   EOSABS 0.0 10/09/2018 1048   EOSABS 0.1 06/04/2014 1335   BASOSABS 0.0 10/09/2018 1048   BASOSABS 0.1 06/04/2014 1335    Hgb A1C No results found for: HGBA1C      Assessment and Plan:  Scratchy Throat, Cough:  RX for Pred Taper x 6 days RX for Albuterol 1 puff Q4H prn  cough If no improvement, will consider Flovent 44 mcg inhalation BID  Return precautions discussed  Follow Up Instructions:    I discussed the assessment and treatment plan with the patient. The patient was provided an opportunity to ask questions and all were answered. The patient agreed with the plan and demonstrated an understanding of the instructions.   The patient was advised to call back or seek an in-person evaluation if the symptoms worsen or if the condition fails to improve as anticipated.   Nicki Reaper, NP

## 2020-03-29 MED ORDER — FLOVENT HFA 44 MCG/ACT IN AERO: 1.0000 | INHALATION_SPRAY | Freq: Two times a day (BID) | RESPIRATORY_TRACT | 0 refills | Status: DC

## 2020-04-26 ENCOUNTER — Encounter: Payer: Self-pay | Admitting: Internal Medicine

## 2020-04-26 ENCOUNTER — Ambulatory Visit (INDEPENDENT_AMBULATORY_CARE_PROVIDER_SITE_OTHER): Payer: Medicaid Other | Admitting: Internal Medicine

## 2020-04-26 ENCOUNTER — Other Ambulatory Visit: Payer: Self-pay

## 2020-04-26 VITALS — BP 126/82 | HR 71 | Temp 98.1°F | Ht 64.0 in | Wt 223.0 lb

## 2020-04-26 DIAGNOSIS — H579 Unspecified disorder of eye and adnexa: Secondary | ICD-10-CM | POA: Diagnosis not present

## 2020-04-26 DIAGNOSIS — R454 Irritability and anger: Secondary | ICD-10-CM | POA: Diagnosis not present

## 2020-04-26 DIAGNOSIS — R239 Unspecified skin changes: Secondary | ICD-10-CM | POA: Diagnosis not present

## 2020-04-26 DIAGNOSIS — R61 Generalized hyperhidrosis: Secondary | ICD-10-CM

## 2020-04-26 DIAGNOSIS — R5383 Other fatigue: Secondary | ICD-10-CM | POA: Diagnosis not present

## 2020-04-26 DIAGNOSIS — F339 Major depressive disorder, recurrent, unspecified: Secondary | ICD-10-CM

## 2020-04-26 DIAGNOSIS — Z0001 Encounter for general adult medical examination with abnormal findings: Secondary | ICD-10-CM | POA: Diagnosis not present

## 2020-04-26 LAB — LIPID PANEL
Cholesterol: 189 mg/dL (ref 0–200)
HDL: 48.4 mg/dL (ref >39.00)
LDL Cholesterol: 127 mg/dL — ABNORMAL HIGH (ref 0–99)
NonHDL: 140.12
Total CHOL/HDL Ratio: 4
Triglycerides: 64 mg/dL (ref 0.0–149.0)
VLDL: 12.8 mg/dL (ref 0.0–40.0)

## 2020-04-26 LAB — COMPREHENSIVE METABOLIC PANEL
ALT: 13 U/L (ref 0–35)
AST: 13 U/L (ref 0–37)
Albumin: 4.3 g/dL (ref 3.5–5.2)
Alkaline Phosphatase: 64 U/L (ref 39–117)
BUN: 9 mg/dL (ref 6–23)
CO2: 28 mEq/L (ref 19–32)
Calcium: 9.5 mg/dL (ref 8.4–10.5)
Chloride: 103 mEq/L (ref 96–112)
Creatinine, Ser: 0.69 mg/dL (ref 0.40–1.20)
GFR: 106.22 mL/min (ref >60.00)
Glucose, Bld: 94 mg/dL (ref 70–99)
Potassium: 4.2 mEq/L (ref 3.5–5.1)
Sodium: 138 mEq/L (ref 135–145)
Total Bilirubin: 0.5 mg/dL (ref 0.2–1.2)
Total Protein: 7 g/dL (ref 6.0–8.3)

## 2020-04-26 LAB — CBC
HCT: 41 % (ref 36.0–46.0)
Hemoglobin: 14.1 g/dL (ref 12.0–15.0)
MCHC: 34.3 g/dL (ref 30.0–36.0)
MCV: 92.1 fl (ref 78.0–100.0)
Platelets: 228 10*3/uL (ref 150.0–400.0)
RBC: 4.45 Mil/uL (ref 3.87–5.11)
RDW: 12.6 % (ref 11.5–15.5)
WBC: 6.8 10*3/uL (ref 4.0–10.5)

## 2020-04-26 LAB — LUTEINIZING HORMONE: LH: 5.96 m[IU]/mL

## 2020-04-26 LAB — HEMOGLOBIN A1C: Hgb A1c MFr Bld: 5.2 % (ref 4.6–6.5)

## 2020-04-26 LAB — TSH: TSH: 1.58 u[IU]/mL (ref 0.35–4.50)

## 2020-04-26 LAB — FOLLICLE STIMULATING HORMONE: FSH: 2.3 m[IU]/mL

## 2020-04-26 LAB — TESTOSTERONE: Testosterone: 46.77 ng/dL — ABNORMAL HIGH (ref 15.00–40.00)

## 2020-04-26 NOTE — Addendum Note (Signed)
Addended by: Lorre Munroe on: 04/26/2020 11:29 AM   Modules accepted: Orders

## 2020-04-26 NOTE — Patient Instructions (Signed)
Health Maintenance, Female Adopting a healthy lifestyle and getting preventive care are important in promoting health and wellness. Ask your health care provider about:  The right schedule for you to have regular tests and exams.  Things you can do on your own to prevent diseases and keep yourself healthy. What should I know about diet, weight, and exercise? Eat a healthy diet  Eat a diet that includes plenty of vegetables, fruits, low-fat dairy products, and lean protein.  Do not eat a lot of foods that are high in solid fats, added sugars, or sodium.   Maintain a healthy weight Body mass index (BMI) is used to identify weight problems. It estimates body fat based on height and weight. Your health care provider can help determine your BMI and help you achieve or maintain a healthy weight. Get regular exercise Get regular exercise. This is one of the most important things you can do for your health. Most adults should:  Exercise for at least 150 minutes each week. The exercise should increase your heart rate and make you sweat (moderate-intensity exercise).  Do strengthening exercises at least twice a week. This is in addition to the moderate-intensity exercise.  Spend less time sitting. Even light physical activity can be beneficial. Watch cholesterol and blood lipids Have your blood tested for lipids and cholesterol at 44 years of age, then have this test every 5 years. Have your cholesterol levels checked more often if:  Your lipid or cholesterol levels are high.  You are older than 44 years of age.  You are at high risk for heart disease. What should I know about cancer screening? Depending on your health history and family history, you may need to have cancer screening at various ages. This may include screening for:  Breast cancer.  Cervical cancer.  Colorectal cancer.  Skin cancer.  Lung cancer. What should I know about heart disease, diabetes, and high blood  pressure? Blood pressure and heart disease  High blood pressure causes heart disease and increases the risk of stroke. This is more likely to develop in people who have high blood pressure readings, are of African descent, or are overweight.  Have your blood pressure checked: ? Every 3-5 years if you are 18-39 years of age. ? Every year if you are 40 years old or older. Diabetes Have regular diabetes screenings. This checks your fasting blood sugar level. Have the screening done:  Once every three years after age 40 if you are at a normal weight and have a low risk for diabetes.  More often and at a younger age if you are overweight or have a high risk for diabetes. What should I know about preventing infection? Hepatitis B If you have a higher risk for hepatitis B, you should be screened for this virus. Talk with your health care provider to find out if you are at risk for hepatitis B infection. Hepatitis C Testing is recommended for:  Everyone born from 1945 through 1965.  Anyone with known risk factors for hepatitis C. Sexually transmitted infections (STIs)  Get screened for STIs, including gonorrhea and chlamydia, if: ? You are sexually active and are younger than 44 years of age. ? You are older than 44 years of age and your health care provider tells you that you are at risk for this type of infection. ? Your sexual activity has changed since you were last screened, and you are at increased risk for chlamydia or gonorrhea. Ask your health care provider   if you are at risk.  Ask your health care provider about whether you are at high risk for HIV. Your health care provider may recommend a prescription medicine to help prevent HIV infection. If you choose to take medicine to prevent HIV, you should first get tested for HIV. You should then be tested every 3 months for as long as you are taking the medicine. Pregnancy  If you are about to stop having your period (premenopausal) and  you may become pregnant, seek counseling before you get pregnant.  Take 400 to 800 micrograms (mcg) of folic acid every day if you become pregnant.  Ask for birth control (contraception) if you want to prevent pregnancy. Osteoporosis and menopause Osteoporosis is a disease in which the bones lose minerals and strength with aging. This can result in bone fractures. If you are 65 years old or older, or if you are at risk for osteoporosis and fractures, ask your health care provider if you should:  Be screened for bone loss.  Take a calcium or vitamin D supplement to lower your risk of fractures.  Be given hormone replacement therapy (HRT) to treat symptoms of menopause. Follow these instructions at home: Lifestyle  Do not use any products that contain nicotine or tobacco, such as cigarettes, e-cigarettes, and chewing tobacco. If you need help quitting, ask your health care provider.  Do not use street drugs.  Do not share needles.  Ask your health care provider for help if you need support or information about quitting drugs. Alcohol use  Do not drink alcohol if: ? Your health care provider tells you not to drink. ? You are pregnant, may be pregnant, or are planning to become pregnant.  If you drink alcohol: ? Limit how much you use to 0-1 drink a day. ? Limit intake if you are breastfeeding.  Be aware of how much alcohol is in your drink. In the [place] one drink equals one 12 oz bottle of beer (355 mL), one 5 oz glass of wine (148 mL), or one 1 oz glass of hard liquor (44 mL). General instructions  Schedule regular health, dental, and eye exams.  Stay current with your vaccines.  Tell your health care provider if: ? You often feel depressed. ? You have ever been abused or do not feel safe at home. Summary  Adopting a healthy lifestyle and getting preventive care are important in promoting health and wellness.  Follow your health care provider's instructions about healthy  diet, exercising, and getting tested or screened for diseases.  Follow your health care provider's instructions on monitoring your cholesterol and blood pressure. This information is not intended to replace advice given to you by your health care provider. Make sure you discuss any questions you have with your health care provider. Document Revised: 01/29/2018 Document Reviewed: 01/29/2018 Elsevier Patient Education  2021 Else[employer] Inc.  

## 2020-04-26 NOTE — Progress Notes (Signed)
Subjective:    Patient ID: [name], female    DOB: 1975-08-24, 45 y.o.   MRN: [id number]  HPI  Patient presents the clinic today for her annual exam.  Depression: Currently not an issue. She is not medicated and not currently seeing a therapist. She denies SI/HI.   She would like a referral to dermatology for a general skin check.  She would like a referral to an eye doctor for a vision exam/checkup on her eye health.  Flu: never Tetanus: 02/2019 COVID: [place] x2 Pap smear: 09/2018 Mammogram: 10/2019 Vision screening: as needed Dentist: annually  Diet: She does eat meat. She consumes fruits and veggies. She does eat fried foods. She drinks mostly water. Exercise: Walking  Review of Systems      Past Medical History:  Diagnosis Date  . Depression   . Diverticulosis 2018  . Frequent headaches   . Gestational diabetes    Negative 2 hr GTT postpartum on 06/11/2019  . Heart murmur   . History of chicken pox     Current Outpatient Medications  Medication Sig Dispense Refill  . albuterol (VENTOLIN HFA) 108 (90 Base) MCG/ACT inhaler Inhale 1 puff into the lungs every 6 (six) hours as needed for wheezing or shortness of breath. 8 g 0  . Calcium Polycarbophil (FIBER-CAPS PO) Take 2 capsules by mouth daily.    . fluticasone (FLOVENT HFA) 44 MCG/ACT inhaler Inhale 1 puff into the lungs 2 (two) times daily. 10.6 g 0  . hydrocortisone (ANUSOL-HC) 25 MG suppository Place 1 suppository (25 mg total) rectally 2 (two) times daily. 12 suppository 2  . ibuprofen (ADVIL) 600 MG tablet Take 1 tablet (600 mg total) by mouth every 6 (six) hours. 30 tablet 0  . Misc. Devices (BREAST PUMP) MISC Dispense one breast pump for patient 1 each 0  . norethindrone (MICRONOR) 0.35 MG tablet Take 1 tablet (0.35 mg total) by mouth daily. (Patient not taking: Reported on 06/11/2019) 1 Package 11  . Prenatal Vit-Fe Fumarate-FA (PRENATAL MULTIVITAMIN) TABS tablet Take 1 tablet by mouth daily at 12  noon.     No current facility-administered medications for this visit.    No Known Allergies  Family History  Problem Relation Age of Onset  . Heart disease Mother        Unsure    Social History   Socioeconomic History  . Marital status: Divorced    Spouse name: Not on file  . Number of children: Not on file  . Years of education: Not on file  . Highest education level: Not on file  Occupational History  . Not on file  Tobacco Use  . Smoking status: Never Smoker  . Smokeless tobacco: Never Used  Vaping Use  . Vaping Use: Never used  Substance and Sexual Activity  . Alcohol use: Not Currently  . Drug use: Never  . Sexual activity: Yes    Birth control/protection: None  Other Topics Concern  . Not on file  Social History Narrative  . Not on file   Social Determinants of Health   Financial Resource Strain: Not on file  Food Insecurity: Not on file  Transportation Needs: Not on file  Physical Activity: Not on file  Stress: Not on file  Social Connections: Not on file  Intimate Partner Violence: Not on file     Constitutional: Pt reports fatigue. Denies fever, malaise, headache or abrupt weight changes.  HEENT: Denies eye pain, eye redness, ear pain, ringing in the ears,  wax buildup, runny nose, nasal congestion, bloody nose, or sore throat. Respiratory: Denies difficulty breathing, shortness of breath, cough or sputum production.   Cardiovascular: Denies chest pain, chest tightness, palpitations or swelling in the hands or feet.  Gastrointestinal: Denies abdominal pain, bloating, constipation, diarrhea or blood in the stool.  GU: Denies urgency, frequency, pain with urination, burning sensation, blood in urine, odor or discharge. Musculoskeletal: Denies decrease in range of motion, difficulty with gait, muscle pain or joint pain and swelling.  Skin: Denies redness, rashes, lesions or ulcercations.  Neurological: Pt reports night sweats. Denies dizziness,  difficulty with memory, difficulty with speech or problems with balance and coordination.  Psych: Pt reports irritability. Denies anxiety, depression, SI/HI.  No other specific complaints in a complete review of systems (except as listed in HPI above).  Objective:   Physical Exam  BP 126/82   Pulse 71   Temp 98.1 F (36.7 C) (Temporal)   Ht '5\' 4"'  (1.626 m)   Wt 223 lb (101.2 kg)   LMP 04/05/2020   SpO2 98%   BMI 38.28 kg/m    Wt Readings from Last 3 Encounters:  06/11/19 198 lb (89.8 kg)  04/24/19 224 lb 8 oz (101.8 kg)  04/21/19 218 lb 12.8 oz (99.2 kg)    General: Appears her stated age, obese, in NAD. Skin: Warm, dry and intact. No rashesnoted. HEENT: Head: normal shape and size; Eyes: sclera white, no icterus, conjunctiva pink, PERRLA and EOMs intact; Neck:  Neck supple, trachea midline. No masses, lumps or thyromegaly present.  Cardiovascular: Normal rate and rhythm. S1,S2 noted.  No murmur, rubs or gallops noted. No JVD or BLE edema. Pulmonary/Chest: Normal effort and positive vesicular breath sounds. No respiratory distress. No wheezes, rales or ronchi noted.  Abdomen: Soft and nontender. Normal bowel sounds. No distention or masses noted. Liver, spleen and kidneys non palpable. Musculoskeletal:  Strength 5/5 BUE/BLE. No difficulty with gait.  Neurological: Alert and oriented. Cranial nerves II-XII grossly intact. Coordination normal.  Psychiatric: Mood and affect normal. Behavior is normal. Judgment and thought content normal.    BMET    Component Value Date/Time   NA 135 04/25/2019 0207   NA 138 02/26/2019 1052   NA 140 06/04/2014 1335   K 3.4 (L) 04/25/2019 0207   K 4.2 06/04/2014 1335   CL 107 04/25/2019 0207   CL 107 06/04/2014 1335   CO2 18 (L) 04/25/2019 0207   CO2 27 06/04/2014 1335   GLUCOSE 202 (H) 04/25/2019 0207   GLUCOSE 106 (H) 06/04/2014 1335   BUN 5 (L) 04/25/2019 0207   BUN 6 02/26/2019 1052   BUN 9 06/04/2014 1335   CREATININE 0.66  04/25/2019 0207   CREATININE 0.60 06/04/2014 1335   CALCIUM 8.8 (L) 04/25/2019 0207   CALCIUM 9.4 06/04/2014 1335   GFRNONAA >60 04/25/2019 0207   GFRNONAA >60 06/04/2014 1335   GFRAA >60 04/25/2019 0207   GFRAA >60 06/04/2014 1335    Lipid Panel  No results found for: CHOL, TRIG, HDL, CHOLHDL, VLDL, LDLCALC  CBC    Component Value Date/Time   WBC 18.2 (H) 04/25/2019 0207   RBC 3.99 04/25/2019 0207   HGB 13.2 04/25/2019 0207   HGB 13.3 02/05/2019 0840   HCT 38.2 04/25/2019 0207   HCT 38.7 02/05/2019 0840   PLT 172 04/25/2019 0207   PLT 211 02/05/2019 0840   MCV 95.7 04/25/2019 0207   MCV 95 02/05/2019 0840   MCV 91 06/04/2014 1335   MCH 33.1  04/25/2019 0207   MCHC 34.6 04/25/2019 0207   RDW 12.3 04/25/2019 0207   RDW 11.8 02/05/2019 0840   RDW 12.6 06/04/2014 1335   LYMPHSABS 1.3 10/09/2018 1048   LYMPHSABS 2.0 06/04/2014 1335   MONOABS 0.8 05/31/2016 1546   MONOABS 0.5 06/04/2014 1335   EOSABS 0.0 10/09/2018 1048   EOSABS 0.1 06/04/2014 1335   BASOSABS 0.0 10/09/2018 1048   BASOSABS 0.1 06/04/2014 1335    Hgb A1C No results found for: HGBA1C          Assessment & Plan:  Preventative Health Maintenance:  She declines flu shot today Tetanus UTD Encouraged her to get her COVID booster Pap smear UTD Mammogram UTD Encouraged her to consume a balanced diet and exercise regimen Advised her to see an eye doctor and dentist annually  We will check CBC, c-Met, lipid, A1c today  Fatigue, Night Sweats, Irritability:  Will check TSH, FSH/LH, estrogens, progesterone and testosterone today Could consider sleep study to rule out sleep apnea  RTC in 1 year, sooner if needed  Webb Silversmith, NP This visit occurred during the SARS-CoV-2 public health emergency.  Safety protocols were in place, including screening questions prior to the visit, additional usage of staff PPE, and extensive cleaning of exam room while observing appropriate contact time as indicated for  disinfecting solutions.

## 2020-04-30 LAB — PROGESTERONE: Progesterone: 2.7 ng/mL

## 2020-04-30 LAB — ESTROGENS, TOTAL: Estrogen: 420.6 pg/mL

## 2020-05-03 DIAGNOSIS — L700 Acne vulgaris: Secondary | ICD-10-CM | POA: Diagnosis not present

## 2020-05-03 DIAGNOSIS — L814 Other melanin hyperpigmentation: Secondary | ICD-10-CM | POA: Diagnosis not present

## 2020-05-03 DIAGNOSIS — L905 Scar conditions and fibrosis of skin: Secondary | ICD-10-CM | POA: Diagnosis not present

## 2020-05-12 ENCOUNTER — Other Ambulatory Visit: Payer: Self-pay | Admitting: *Deleted

## 2020-05-12 MED ORDER — NORETHINDRONE 0.35 MG PO TABS: 1.0000 | ORAL_TABLET | Freq: Every day | ORAL | 2 refills | Status: DC

## 2020-05-26 ENCOUNTER — Other Ambulatory Visit: Payer: Self-pay | Admitting: Internal Medicine

## 2020-06-14 ENCOUNTER — Ambulatory Visit: Payer: Medicaid Other | Admitting: Obstetrics & Gynecology

## 2020-07-07 ENCOUNTER — Ambulatory Visit: Payer: Medicaid Other | Admitting: Obstetrics & Gynecology

## 2020-09-19 ENCOUNTER — Encounter: Payer: Self-pay | Admitting: Obstetrics & Gynecology

## 2020-09-19 ENCOUNTER — Encounter: Payer: Self-pay | Admitting: Radiology

## 2020-09-19 ENCOUNTER — Ambulatory Visit (INDEPENDENT_AMBULATORY_CARE_PROVIDER_SITE_OTHER): Payer: Medicaid Other | Admitting: Obstetrics & Gynecology

## 2020-09-19 ENCOUNTER — Other Ambulatory Visit (HOSPITAL_COMMUNITY)
Admission: RE | Admit: 2020-09-19 | Discharge: 2020-09-19 | Disposition: A | Discharge status: Home / Self Care | Payer: Medicaid Other | Source: Ambulatory Visit | Attending: Obstetrics & Gynecology | Admitting: Obstetrics & Gynecology

## 2020-09-19 VITALS — BP 149/91 | HR 82 | Ht 64.0 in | Wt 213.6 lb

## 2020-09-19 DIAGNOSIS — Z3009 Encounter for other general counseling and advice on contraception: Secondary | ICD-10-CM

## 2020-09-19 DIAGNOSIS — Z01419 Encounter for gynecological examination (general) (routine) without abnormal findings: Secondary | ICD-10-CM | POA: Insufficient documentation

## 2020-09-19 DIAGNOSIS — Z1231 Encounter for screening mammogram for malignant neoplasm of breast: Secondary | ICD-10-CM | POA: Diagnosis not present

## 2020-09-19 NOTE — Patient Instructions (Signed)
Preventive Care 40-44 Years Old, Female Preventive care refers to lifestyle choices and visits with your health care provider that can promote health and wellness. This includes: A yearly physical exam. This is also called an annual wellness visit. Regular dental and eye exams. Immunizations. Screening for certain conditions. Healthy lifestyle choices, such as: Eating a healthy diet. Getting regular exercise. Not using drugs or products that contain nicotine and tobacco. Limiting alcohol use. What can I expect for my preventive care visit? Physical exam Your health care provider will check your: Height and weight. These may be used to calculate your BMI (body mass index). BMI is a measurement that tells if you are at a healthy weight. Heart rate and blood pressure. Body temperature. Skin for abnormal spots. Counseling Your health care provider may ask you questions about your: Past medical problems. Family's medical history. Alcohol, tobacco, and drug use. Emotional well-being. Home life and relationship well-being. Sexual activity. Diet, exercise, and sleep habits. Work and work environment. Access to firearms. Method of birth control. Menstrual cycle. Pregnancy history. What immunizations do I need?  Vaccines are usually given at various ages, according to a schedule. Your health care provider will recommend vaccines for you based on your age, medicalhistory, and lifestyle or other factors, such as travel or where you work. What tests do I need? Blood tests Lipid and cholesterol levels. These may be checked every 5 years, or more often if you are over 50 years old. Hepatitis C test. Hepatitis B test. Screening Lung cancer screening. You may have this screening every year starting at age 55 if you have a 30-pack-year history of smoking and currently smoke or have quit within the past 15 years. Colorectal cancer screening. All adults should have this screening starting at  age 50 and continuing until age 75. Your health care provider may recommend screening at age 45 if you are at increased risk. You will have tests every 1-10 years, depending on your results and the type of screening test. Diabetes screening. This is done by checking your blood sugar (glucose) after you have not eaten for a while (fasting). You may have this done every 1-3 years. Mammogram. This may be done every 1-2 years. Talk with your health care provider about when you should start having regular mammograms. This may depend on whether you have a family history of breast cancer. BRCA-related cancer screening. This may be done if you have a family history of breast, ovarian, tubal, or peritoneal cancers. Pelvic exam and Pap test. This may be done every 3 years starting at age 21. Starting at age 30, this may be done every 5 years if you have a Pap test in combination with an HPV test. Other tests STD (sexually transmitted disease) testing, if you are at risk. Bone density scan. This is done to screen for osteoporosis. You may have this scan if you are at high risk for osteoporosis. Talk with your health care provider about your test results, treatment options,and if necessary, the need for more tests. Follow these instructions at home: Eating and drinking  Eat a diet that includes fresh fruits and vegetables, whole grains, lean protein, and low-fat dairy products. Take vitamin and mineral supplements as recommended by your health care provider. Do not drink alcohol if: Your health care provider tells you not to drink. You are pregnant, may be pregnant, or are planning to become pregnant. If you drink alcohol: Limit how much you have to 0-1 drink a day. Be aware   of how much alcohol is in your drink. In the [place] one drink equals one 12 oz bottle of beer (355 mL), one 5 oz glass of wine (148 mL), or one 1 oz glass of hard liquor (44 mL).  Lifestyle Take daily care of your teeth and  gums. Brush your teeth every morning and night with fluoride toothpaste. Floss one time each day. Stay active. Exercise for at least 30 minutes 5 or more days each week. Do not use any products that contain nicotine or tobacco, such as cigarettes, e-cigarettes, and chewing tobacco. If you need help quitting, ask your health care provider. Do not use drugs. If you are sexually active, practice safe sex. Use a condom or other form of protection to prevent STIs (sexually transmitted infections). If you do not wish to become pregnant, use a form of birth control. If you plan to become pregnant, see your health care provider for a prepregnancy visit. If told by your health care provider, take low-dose aspirin daily starting at age 50. Find healthy ways to cope with stress, such as: Meditation, yoga, or listening to music. Journaling. Talking to a trusted person. Spending time with friends and family. Safety Always wear your seat belt while driving or riding in a vehicle. Do not drive: If you have been drinking alcohol. Do not ride with someone who has been drinking. When you are tired or distracted. While texting. Wear a helmet and other protective equipment during sports activities. If you have firearms in your house, make sure you follow all gun safety procedures. What's next? Visit your health care provider once a year for an annual wellness visit. Ask your health care provider how often you should have your eyes and teeth checked. Stay up to date on all vaccines. This information is not intended to replace advice given to you by your health care provider. Make sure you discuss any questions you have with your healthcare provider. Document Revised: 11/10/2019 Document Reviewed: 10/17/2017 Elsevier Patient Education  2022 Elsevier Inc.  

## 2020-09-19 NOTE — Progress Notes (Signed)
GYNECOLOGY ANNUAL PREVENTATIVE CARE ENCOUNTER NOTE AND SURGICAL CONSULT NOTE  History:     [name] is a 45 y.o. [phone] female here for a routine annual gynecologic exam.  Current complaints: desires sterilization.  Has tried IUD, OCPs in the past, had bad hormonal side effects.  Does not want copper IUD, husband refuses to do undergo vasectomy.   Currently on POPs, worried about reduced efficacy and wants to stop them. Denies abnormal vaginal bleeding, discharge, pelvic pain, problems with intercourse or other gynecologic concerns.    Gynecologic History Patient's last menstrual period was 09/05/2020 (approximate). Contraception: oral progesterone-only contraceptive Last Pap: 10/09/2018. Result was normal with negative HPV Last Mammogram: 11/17/2019.  Result was normal (done at [facility], results in CareEverywhere)  Obstetric History OB History  Gravida Para Term Preterm AB Living  4 2 2   2 2   SAB IAB Ectopic Multiple Live Births  2     0 2    # Outcome Date GA Lbr Len/2nd Weight Sex Delivery Anes PTL Lv  4 Term 04/24/19 [redacted]w[redacted]d 03:32 / 00:06 6 lb 15.1 oz (3.15 kg) F Vag-Spont EPI  LIV  3 SAB 07/2018 [redacted]w[redacted]d         2 Term 11/30/03 [redacted]w[redacted]d  8 lb (3.629 kg) M Vag-Spont   LIV  1 SAB             Obstetric Comments  1st Menstrual Cycle:  13   1st Pregnancy:  27    Past Medical History:  Diagnosis Date   Depression    Diverticulosis 2018   Frequent headaches    Gestational diabetes    Negative 2 hr GTT postpartum on 06/11/2019   Heart murmur    History of chicken pox     Past Surgical History:  Procedure Laterality Date   COLONOSCOPY WITH PROPOFOL N/A 11/28/2016   Procedure: COLONOSCOPY WITH PROPOFOL;  Surgeon: 01/28/2017, MD;  Location: [facility] ENDOSCOPY;  Service: Endoscopy;  Laterality: N/A;   EXCISIONAL HEMORRHOIDECTOMY  2014    Current Outpatient Medications on File Prior to Visit  Medication Sig Dispense Refill   cetirizine (ZYRTEC) 10 MG tablet Take by  mouth daily.     fluticasone (FLONASE) 50 MCG/ACT nasal spray Place into both nostrils daily.     norethindrone (NORLYDA) 0.35 MG tablet Take 1 tablet (0.35 mg total) by mouth daily. 84 tablet 2   No current facility-administered medications on file prior to visit.    No Known Allergies  Social History:  reports that she has never smoked. She has never used smokeless tobacco. She reports previous alcohol use. She reports that she does not use drugs.  Family History  Problem Relation Age of Onset   Heart disease Mother        2015    The following portions of the patient's history were reviewed and updated as appropriate: allergies, current medications, past family history, past medical history, past social history, past surgical history and problem list.  Review of Systems Pertinent items noted in HPI and remainder of comprehensive ROS otherwise negative.  Physical Exam:  BP (!) 149/91   Pulse 82   Ht 5\' 4"  (1.626 m)   Wt 213 lb 9.6 oz (96.9 kg)   LMP 09/05/2020 (Approximate)   Breastfeeding No   BMI 36.66 kg/m  CONSTITUTIONAL: Well-developed, well-nourished female in no acute distress.  HENT:  Normocephalic, atraumatic, External right and left ear normal.  EYES: Conjunctivae and EOM are normal. Pupils are equal,  round, and reactive to light. No scleral icterus.  NECK: Normal range of motion, supple, no masses.  Normal thyroid.  SKIN: Skin is warm and dry. No rash noted. Not diaphoretic. No erythema. No pallor. MUSCULOSKELETAL: Normal range of motion. No tenderness.  No cyanosis, clubbing, or edema. NEUROLOGIC: Alert and oriented to person, place, and time. Normal reflexes, muscle tone coordination.  PSYCHIATRIC: Normal mood and affect. Normal behavior. Normal judgment and thought content. CARDIOVASCULAR: Normal heart rate noted, regular rhythm RESPIRATORY: Clear to auscultation bilaterally. Effort and breath sounds normal, no problems with respiration noted. BREASTS:  Symmetric in size. No masses, tenderness, skin changes, nipple drainage, or lymphadenopathy bilaterally. Performed in the presence of a chaperone. ABDOMEN: Soft, obese, no distention noted.  No tenderness, rebound or guarding.  PELVIC: Normal appearing external genitalia and urethral meatus; normal appearing vaginal mucosa and cervix.  No abnormal vaginal discharge noted.  Pap smear obtained.  Normal uterine size, no other palpable masses, no uterine or adnexal tenderness.  Performed in the presence of a chaperone.   Assessment and Plan:     1. Consultation for female sterilization Patient desires bilateral tubal sterilization.   Other forms of contraception were discussed with patient and emphasized alternatives of vasectomy, IUDs and Nexplanon as they have equivalent contraceptive efficacy; she declines all other modalities. Discussed bilateral tubal sterilization in detail; discussed option of laparoscopic bilateral salpingectomy. Risks and benefits discussed in detail including but not limited to: risk of regret, permanence of method, bleeding, infection, injury to surrounding organs and need for additional procedures.  Failure risk of <1% for bilateral salpingectomy with increased risk of ectopic gestation if pregnancy occurs was also discussed with patient.  Also discussed possibility of post-tubal pain syndrome.  Discussed possible reduction of risk of ovarian cancer via bilateral salpingectomy given that a growing body of knowledge reveals that the majority of cases of high grade serous "ovarian" cancer actually are actually  cancers arising from the fimbriated end of the fallopian tubes. Emphasized that removal of fallopian tubes do not result in any known hormonal imbalance.  Patient verbalized understanding of these risks and benefits and wants to proceed with sterilization with laparoscopic bilateral salpingectomy.   Patient was told that the likelihood that her condition and symptoms will be  treated effectively with this surgical management was very high; the postoperative expectations were also discussed in detail. The patient also understands the alternative treatment options which were discussed in full. All questions were answered.  She was told that she will be contacted by our surgical scheduler regarding the time and date of her surgery; routine preoperative instructions will be given to her by the preoperative nursing team.  Patient education handouts about the procedure were given to the patient to review at home. Medicaid papers signed today, patient understands that surgery will be scheduled at least 30 days after the day papers are signed as per Medicaid guidelines. In the meantime, patient will continue POPs  for contraception prior to surgery. *I spent 30 minutes face to face time with the patient discussing sterilization details and answering all her questions*  2. Breast cancer screening by mammogram Mammogram to be scheduled at [facility] as per her choice. - MM 3D SCREEN BREAST BILATERAL; Future  3. Well woman exam with routine gynecological exam - Cytology - PAP Will follow up results of pap smear and manage accordingly. Routine preventative health maintenance measures emphasized. Please refer to After Visit Summary for other counseling recommendations.  Verita Schneiders, MD, [place] for [employer], [place]

## 2020-09-20 ENCOUNTER — Ambulatory Visit (INDEPENDENT_AMBULATORY_CARE_PROVIDER_SITE_OTHER): Payer: Medicaid Other | Admitting: Internal Medicine

## 2020-09-20 ENCOUNTER — Encounter: Payer: Self-pay | Admitting: Internal Medicine

## 2020-09-20 ENCOUNTER — Other Ambulatory Visit: Payer: Self-pay

## 2020-09-20 VITALS — BP 140/79 | HR 89 | Temp 97.7°F | Resp 18 | Ht 64.0 in | Wt 213.2 lb

## 2020-09-20 DIAGNOSIS — E6609 Other obesity due to excess calories: Secondary | ICD-10-CM | POA: Insufficient documentation

## 2020-09-20 DIAGNOSIS — Z6836 Body mass index (BMI) 36.0-36.9, adult: Secondary | ICD-10-CM

## 2020-09-20 MED ORDER — NALTREXONE-BUPROPION HCL ER 8-90 MG PO TB12: 2.0000 | ORAL_TABLET | Freq: Two times a day (BID) | ORAL | 0 refills | Status: DC

## 2020-09-20 NOTE — Assessment & Plan Note (Signed)
Encouraged calorie restricted diet that consist mainly of protein and veggies D/c Naltrexone and Wellbutrin RX for Contrave 2 tabs BID  RTC in 1 month for weight check and med refill

## 2020-09-20 NOTE — Patient Instructions (Signed)

## 2020-09-20 NOTE — Progress Notes (Signed)
Subjective:    Patient ID: [name], female    DOB: Jul 14, 1975, 45 y.o.   MRN: [id number]  HPI  Pt presents to the clinic today to discuss medication for weight loss. She started Wellbutrin and Naltrexone at the end of April, prescribed through an online MD. Her starting weight was 226 lbs. Her weight today is 213 lbs, with a BMI of 36.60.  Breakfast: 1-2 Boiled eggs, 1-2 [place] sausage patties, or frittata muffins, banana or coffee Lunch: Salad, leftovers from dinner Dinner: Chicken fajita's, meat, rice and veggies Snacks: none Exercise: walking, stretching, kickboxing  Review of Systems  Past Medical History:  Diagnosis Date   Depression    Diverticulosis 2018   Frequent headaches    Gestational diabetes    Negative 2 hr GTT postpartum on 06/11/2019   Heart murmur    History of chicken pox     Current Outpatient Medications  Medication Sig Dispense Refill   cetirizine (ZYRTEC) 10 MG tablet Take by mouth daily.     fluticasone (FLONASE) 50 MCG/ACT nasal spray Place into both nostrils daily.     norethindrone (NORLYDA) 0.35 MG tablet Take 1 tablet (0.35 mg total) by mouth daily. 84 tablet 2   No current facility-administered medications for this visit.    No Known Allergies  Family History  Problem Relation Age of Onset   Heart disease Mother        Unsure    Social History   Socioeconomic History   Marital status: Divorced    Spouse name: Not on file   Number of children: Not on file   Years of education: Not on file   Highest education level: Not on file  Occupational History   Not on file  Tobacco Use   Smoking status: Never   Smokeless tobacco: Never  Vaping Use   Vaping Use: Never used  Substance and Sexual Activity   Alcohol use: Not Currently   Drug use: Never   Sexual activity: Yes    Birth control/protection: None  Other Topics Concern   Not on file  Social History Narrative   Not on file   Social Determinants of Health    Financial Resource Strain: Not on file  Food Insecurity: Not on file  Transportation Needs: Not on file  Physical Activity: Not on file  Stress: Not on file  Social Connections: Not on file  Intimate Partner Violence: Not on file     Constitutional: Denies fever, malaise, fatigue, headache or abrupt weight changes.  Respiratory: Denies difficulty breathing, shortness of breath, cough or sputum production.   Cardiovascular: Denies chest pain, chest tightness, palpitations or swelling in the hands or feet.  Neurological: Denies dizziness, difficulty with memory, difficulty with speech or problems with balance and coordination.    No other specific complaints in a complete review of systems (except as listed in HPI above).     Objective:   Physical Exam  BP 140/79 (BP Location: Right Arm, Patient Position: Sitting, Cuff Size: Large)   Pulse 89   Temp 97.7 F (36.5 C) (Temporal)   Resp 18   Ht 5\' 4"  (1.626 m)   Wt 213 lb 3.2 oz (96.7 kg)   LMP 09/05/2020 (Approximate)   SpO2 100%   BMI 36.60 kg/m   Wt Readings from Last 3 Encounters:  09/19/20 213 lb 9.6 oz (96.9 kg)  04/26/20 223 lb (101.2 kg)  06/11/19 198 lb (89.8 kg)    General: Appears her stated age, obese,  in NAD. Skin: Warm, dry and intact. N HEENT: Head: normal shape and size; Eyes: sclera white and EOMs intact;  Cardiovascular: Normal rate. Pulmonary/Chest: Normal effort. Neurological: Alert and oriented.    BMET    Component Value Date/Time   NA 138 04/26/2020 1116   NA 138 02/26/2019 1052   NA 140 06/04/2014 1335   K 4.2 04/26/2020 1116   K 4.2 06/04/2014 1335   CL 103 04/26/2020 1116   CL 107 06/04/2014 1335   CO2 28 04/26/2020 1116   CO2 27 06/04/2014 1335   GLUCOSE 94 04/26/2020 1116   GLUCOSE 106 (H) 06/04/2014 1335   BUN 9 04/26/2020 1116   BUN 6 02/26/2019 1052   BUN 9 06/04/2014 1335   CREATININE 0.69 04/26/2020 1116   CREATININE 0.60 06/04/2014 1335   CALCIUM 9.5 04/26/2020 1116    CALCIUM 9.4 06/04/2014 1335   GFRNONAA >60 04/25/2019 0207   GFRNONAA >60 06/04/2014 1335   GFRAA >60 04/25/2019 0207   GFRAA >60 06/04/2014 1335    Lipid Panel     Component Value Date/Time   CHOL 189 04/26/2020 1116   TRIG 64.0 04/26/2020 1116   HDL 48.40 04/26/2020 1116   CHOLHDL 4 04/26/2020 1116   VLDL 12.8 04/26/2020 1116   LDLCALC 127 (H) 04/26/2020 1116    CBC    Component Value Date/Time   WBC 6.8 04/26/2020 1116   RBC 4.45 04/26/2020 1116   HGB 14.1 04/26/2020 1116   HGB 13.3 02/05/2019 0840   HCT 41.0 04/26/2020 1116   HCT 38.7 02/05/2019 0840   PLT 228.0 04/26/2020 1116   PLT 211 02/05/2019 0840   MCV 92.1 04/26/2020 1116   MCV 95 02/05/2019 0840   MCV 91 06/04/2014 1335   MCH 33.1 04/25/2019 0207   MCHC 34.3 04/26/2020 1116   RDW 12.6 04/26/2020 1116   RDW 11.8 02/05/2019 0840   RDW 12.6 06/04/2014 1335   LYMPHSABS 1.3 10/09/2018 1048   LYMPHSABS 2.0 06/04/2014 1335   MONOABS 0.8 05/31/2016 1546   MONOABS 0.5 06/04/2014 1335   EOSABS 0.0 10/09/2018 1048   EOSABS 0.1 06/04/2014 1335   BASOSABS 0.0 10/09/2018 1048   BASOSABS 0.1 06/04/2014 1335    Hgb A1C Lab Results  Component Value Date   HGBA1C 5.2 04/26/2020            Assessment & Plan:   Nicki Reaper, NP This visit occurred during the SARS-CoV-2 public health emergency.  Safety protocols were in place, including screening questions prior to the visit, additional usage of staff PPE, and extensive cleaning of exam room while observing appropriate contact time as indicated for disinfecting solutions.

## 2020-09-21 ENCOUNTER — Other Ambulatory Visit: Payer: Self-pay

## 2020-09-22 LAB — CYTOLOGY - PAP
Chlamydia: NEGATIVE
Comment: NEGATIVE
Comment: NEGATIVE
Comment: NEGATIVE
Comment: NORMAL
Diagnosis: NEGATIVE
High risk HPV: NEGATIVE
Neisseria Gonorrhea: NEGATIVE
Trichomonas: NEGATIVE

## 2020-09-22 NOTE — Telephone Encounter (Signed)
This was filled 2 days ago. Is there an issue with the RX?

## 2020-09-23 ENCOUNTER — Encounter: Payer: Self-pay | Admitting: Internal Medicine

## 2020-09-27 ENCOUNTER — Encounter: Payer: Self-pay | Admitting: *Deleted

## 2020-09-27 ENCOUNTER — Telehealth: Payer: Self-pay | Admitting: *Deleted

## 2020-09-27 NOTE — Telephone Encounter (Signed)
Call to patient. Surgery date options reviewed with patient as well as post-operative recovery restrictions. Agreeable to proceed on 11-16-20- discussed 9:30 surgery and 7:30 arrival at [facility]. Surgery instructions reviewed and advised will receive pre-op call from facility with additional instructions. Letter to follow and visible on My Chart.  Encounter closed.

## 2020-10-25 MED ORDER — BUPROPION HCL ER (SR) 100 MG PO TB12: 100.0000 mg | ORAL_TABLET | Freq: Two times a day (BID) | ORAL | 2 refills | Status: DC

## 2020-10-25 MED ORDER — NALTREXONE HCL 50 MG PO TABS: 25.0000 mg | ORAL_TABLET | Freq: Two times a day (BID) | ORAL | 2 refills | Status: DC

## 2020-10-25 NOTE — Addendum Note (Signed)
Addended by: Lorre Munroe on: 10/25/2020 07:36 AM   Modules accepted: Orders

## 2020-10-27 ENCOUNTER — Ambulatory Visit: Payer: Medicaid Other | Admitting: Internal Medicine

## 2020-11-01 ENCOUNTER — Ambulatory Visit: Payer: Medicaid Other | Admitting: Obstetrics & Gynecology

## 2020-11-10 ENCOUNTER — Ambulatory Visit: Payer: Self-pay | Admitting: Internal Medicine

## 2020-11-15 ENCOUNTER — Other Ambulatory Visit: Payer: Self-pay

## 2020-11-15 ENCOUNTER — Encounter: Payer: Self-pay | Admitting: Obstetrics and Gynecology

## 2020-11-15 ENCOUNTER — Ambulatory Visit (INDEPENDENT_AMBULATORY_CARE_PROVIDER_SITE_OTHER): Payer: Medicaid Other | Admitting: Obstetrics and Gynecology

## 2020-11-15 VITALS — BP 143/86 | HR 66 | Ht 64.0 in | Wt 213.0 lb

## 2020-11-15 DIAGNOSIS — Z30430 Encounter for insertion of intrauterine contraceptive device: Secondary | ICD-10-CM

## 2020-11-15 LAB — POCT URINE PREGNANCY: Preg Test, Ur: NEGATIVE

## 2020-11-15 MED ORDER — PARAGARD INTRAUTERINE COPPER IU IUD
1.0000 | INTRAUTERINE_SYSTEM | Freq: Once | INTRAUTERINE | Status: AC
  Administered 2020-11-15: 1 via INTRAUTERINE

## 2020-11-15 NOTE — Progress Notes (Addendum)
IUD Procedure Note Patient identified, informed consent performed, signed copy in chart, time out was performed.  Urine pregnancy test negative.  Speculum placed in the vagina.  Cervix visualized.  Cleaned with Betadine x 2.  Grasped anteriorly with a single tooth tenaculum.  Uterus sounded to 8 cm.  Paraguard IUD placed per manufacturer's recommendations.  Strings trimmed to 3 cm. Tenaculum was removed, good hemostasis noted.  Patient tolerated procedure well.   Patient given post procedure instructions and Paraguard care card with expiration date.  Patient is asked to check IUD strings periodically and follow up in 4 weeks for IUD check. Patient with normal pap smear 09/2020 Patient with normal mammogram 11/17/2019

## 2020-11-16 ENCOUNTER — Encounter (HOSPITAL_BASED_OUTPATIENT_CLINIC_OR_DEPARTMENT_OTHER): Payer: Self-pay

## 2020-11-16 ENCOUNTER — Ambulatory Visit (HOSPITAL_BASED_OUTPATIENT_CLINIC_OR_DEPARTMENT_OTHER): Admit: 2020-11-16 | Payer: Medicaid Other | Admitting: Obstetrics & Gynecology

## 2020-11-16 SURGERY — SALPINGECTOMY, BILATERAL, LAPAROSCOPIC
Anesthesia: Choice | Laterality: Bilateral

## 2020-11-22 DIAGNOSIS — Z1231 Encounter for screening mammogram for malignant neoplasm of breast: Secondary | ICD-10-CM | POA: Diagnosis not present

## 2020-12-15 ENCOUNTER — Other Ambulatory Visit: Payer: Self-pay

## 2020-12-15 ENCOUNTER — Encounter: Payer: Self-pay | Admitting: Obstetrics and Gynecology

## 2020-12-15 ENCOUNTER — Ambulatory Visit: Payer: Medicaid Other | Admitting: Obstetrics and Gynecology

## 2020-12-15 VITALS — BP 157/98 | HR 77 | Wt 213.0 lb

## 2020-12-15 DIAGNOSIS — Z30431 Encounter for routine checking of intrauterine contraceptive device: Secondary | ICD-10-CM | POA: Diagnosis not present

## 2020-12-15 NOTE — Progress Notes (Signed)
Obstetrics and Gynecology Visit Return Patient Evaluation  Appointment Date: 12/15/2020  Primary Care Provider: Emmit Alexanders Clinic: [facility] for [facility]  Chief Complaint: regularly scheduled IUD check  History of Present Illness:  Ladonna Vanorder is a 45 y.o. s/p 9/27 Paragard IUD insertion.  Pt had a period right after placement and it was a little heavier than before and she's had sex and had some cramping after that. Pt feels like period is about to start  Pap neg 09/2020  Review of Systems: as noted in the History of Present Illness.  Patient Active Problem List   Diagnosis Date Noted   IUD check up 12/15/2020   Class 2 obesity due to excess calories without serious comorbidity with body mass index (BMI) of 36.0 to 36.9 in adult 09/20/2020   MDD (major depressive disorder) 04/08/2018   Medications:  Hinda Lenis had no medications administered during this visit. Current Outpatient Medications  Medication Sig Dispense Refill   buPROPion ER (WELLBUTRIN SR) 100 MG 12 hr tablet Take 1 tablet (100 mg total) by mouth 2 (two) times daily. 60 tablet 2   cetirizine (ZYRTEC) 10 MG tablet Take by mouth daily.     fluticasone (FLONASE) 50 MCG/ACT nasal spray Place into both nostrils daily.     naltrexone (DEPADE) 50 MG tablet Take 0.5 tablets (25 mg total) by mouth 2 (two) times daily. 30 tablet 2   PARAGARD INTRAUTERINE COPPER IU by Intrauterine route.     norethindrone (NORLYDA) 0.35 MG tablet Take 1 tablet (0.35 mg total) by mouth daily. 84 tablet 2   No current facility-administered medications for this visit.    Allergies: has No Known Allergies.  Physical Exam:  BP (!) 157/98   Pulse 77   Wt 213 lb (96.6 kg)   BMI 36.56 kg/m  Body mass index is 36.56 kg/m. General appearance: Well nourished, well developed female in no acute distress.  Abdomen: diffusely non tender to palpation, non distended, and no masses,  hernias Neuro/Psych:  Normal mood and affect.    Pelvic exam:  EGBUS: normal Vaginal vault: scant old blood in the vault Cervix:  IUD strings 3-4cm coming from os Bimanual: negative   Assessment: pt doing well  Plan:  1. IUD check up Normal s/s. Pt told that should improve over the next month or two and to let us know if no improvement   RTC: PRN  Cornelia Copa MD Attending Center for [facility])

## 2020-12-15 NOTE — Progress Notes (Signed)
RGYN patient presents for IUD string check.   Paragard Inserted on 11/15/20.   CC: None

## 2021-01-25 ENCOUNTER — Other Ambulatory Visit: Payer: Self-pay | Admitting: Internal Medicine

## 2021-01-25 NOTE — Telephone Encounter (Signed)
Requested Prescriptions  Pending Prescriptions Disp Refills  . buPROPion ER (WELLBUTRIN SR) 100 MG 12 hr tablet [Pharmacy Med Name: BUPROPION SR 100MG  TABLETS (12 H)] 60 tablet 2    Sig: TAKE 1 TABLET(100 MG) BY MOUTH TWICE DAILY     Psychiatry: Antidepressants - bupropion Failed - 01/25/2021  3:35 AM      Failed - Last BP in normal range    BP Readings from Last 1 Encounters:  12/15/20 (!) 157/98         Passed - Completed PHQ-2 or PHQ-9 in the last 360 days      Passed - Valid encounter within last 6 months    Recent Outpatient Visits          4 months ago Class 2 obesity due to excess calories without serious comorbidity with body mass index (BMI) of 36.0 to 36.9 in adult   [facility] [place], [place], [place]

## 2021-01-27 ENCOUNTER — Telehealth: Payer: Medicaid Other | Admitting: Physician Assistant

## 2021-01-27 DIAGNOSIS — B9689 Other specified bacterial agents as the cause of diseases classified elsewhere: Secondary | ICD-10-CM | POA: Diagnosis not present

## 2021-01-27 DIAGNOSIS — J208 Acute bronchitis due to other specified organisms: Secondary | ICD-10-CM | POA: Diagnosis not present

## 2021-01-27 MED ORDER — BENZONATATE 100 MG PO CAPS: 100.0000 mg | ORAL_CAPSULE | Freq: Three times a day (TID) | ORAL | 0 refills | Status: DC | PRN

## 2021-01-27 MED ORDER — COVID-19 AT HOME ANTIGEN TEST VI KIT: PACK | 0 refills | Status: DC

## 2021-01-27 MED ORDER — AZITHROMYCIN 250 MG PO TABS: ORAL_TABLET | ORAL | 0 refills | Status: AC

## 2021-01-27 NOTE — Progress Notes (Signed)
Virtual Visit Consent   [name], you are scheduled for a virtual visit with a [facility] provider today.     Just as with appointments in the office, your consent must be obtained to participate.  Your consent will be active for this visit and any virtual visit you may have with one of our providers in the next 365 days.     If you have a MyChart account, a copy of this consent can be sent to you electronically.  All virtual visits are billed to your insurance company just like a traditional visit in the office.    As this is a virtual visit, video technology does not allow for your provider to perform a traditional examination.  This may limit your provider's ability to fully assess your condition.  If your provider identifies any concerns that need to be evaluated in person or the need to arrange testing (such as labs, EKG, etc.), we will make arrangements to do so.     Although advances in technology are sophisticated, we cannot ensure that it will always work on either your end or our end.  If the connection with a video visit is poor, the visit may have to be switched to a telephone visit.  With either a video or telephone visit, we are not always able to ensure that we have a secure connection.     I need to obtain your verbal consent now.   Are you willing to proceed with your visit today?    [name] has provided verbal consent on 01/27/2021 for a virtual visit (video or telephone).   [name], [place]   Date: 01/27/2021 11:28 AM   Virtual Visit via Video Note   I, [name], connected with  [name]  ([id number], 09-07-1975) on 01/27/21 at 11:15 AM EST by a video-enabled telemedicine application and verified that I am speaking with the correct person using two identifiers.  Location: Patient: Virtual Visit Location Patient: Home Provider: Virtual Visit Location Provider: Home Office   I discussed the limitations of evaluation  and management by telemedicine and the availability of in person appointments. The patient expressed understanding and agreed to proceed.    History of Present Illness: [name] is a 45 y.o. who identifies as a female who was assigned female at birth, and is being seen today for URI symptoms starting beginning week but progressively worsening. Notes initially with cough and fatigue. Cough was initially dry and mild but note more significant and productive Notes crackling in her chest when breathing on occasion. Denies fever, chest pain or SOB. Today with more significant chills.. Denies recent travel or sick contact. Took a COVID test on Monday which was negative.   HPI: HPI  Problems:  Patient Active Problem List   Diagnosis Date Noted   IUD check up 12/15/2020   Class 2 obesity due to excess calories without serious comorbidity with body mass index (BMI) of 36.0 to 36.9 in adult 09/20/2020   MDD (major depressive disorder) 04/08/2018    Allergies: No Known Allergies Medications:  Current Outpatient Medications:    azithromycin (ZITHROMAX) 250 MG tablet, Take 2 tablets on day 1, then 1 tablet daily on days 2 through 5, Disp: 6 tablet, Rfl: 0   benzonatate (TESSALON) 100 MG capsule, Take 1 capsule (100 mg total) by mouth 3 (three) times daily as needed for cough., Disp: 30 capsule, Rfl: 0   COVID-19 At Home Antigen Test KIT, Use as directed.,  Disp: 1 kit, Rfl: 0   buPROPion ER (WELLBUTRIN SR) 100 MG 12 hr tablet, TAKE 1 TABLET(100 MG) BY MOUTH TWICE DAILY, Disp: 60 tablet, Rfl: 2   cetirizine (ZYRTEC) 10 MG tablet, Take by mouth daily., Disp: , Rfl:    fluticasone (FLONASE) 50 MCG/ACT nasal spray, Place into both nostrils daily., Disp: , Rfl:    naltrexone (DEPADE) 50 MG tablet, Take 0.5 tablets (25 mg total) by mouth 2 (two) times daily., Disp: 30 tablet, Rfl: 2   PARAGARD INTRAUTERINE COPPER IU, by Intrauterine route., Disp: , Rfl:   Observations/Objective: Patient is  well-developed, well-nourished in no acute distress.  Resting comfortably at home.  Head is normocephalic, atraumatic.  No labored breathing. Speech is clear and coherent with logical content.  Patient is alert and oriented at baseline.   Assessment and Plan: 1. Acute bacterial bronchitis - benzonatate (TESSALON) 100 MG capsule; Take 1 capsule (100 mg total) by mouth 3 (three) times daily as needed for cough.  Dispense: 30 capsule; Refill: 0 - azithromycin (ZITHROMAX) 250 MG tablet; Take 2 tablets on day 1, then 1 tablet daily on days 2 through 5  Dispense: 6 tablet; Refill: 0 - COVID-19 At Home Antigen Test KIT; Use as directed.  Dispense: 1 kit; Refill: 0 Double worsening so concern for bacterial URI. Will have her retest for COVID to be cautious but will go ahead and Start Azithromycin and Tessalon. Supportive measures, OTC medications and vitamin regimen recommendations reviewed. She is going to take a COVID test ASAP and let me know the results so we can make further determinations.   Follow Up Instructions: I discussed the assessment and treatment plan with the patient. The patient was provided an opportunity to ask questions and all were answered. The patient agreed with the plan and demonstrated an understanding of the instructions.  A copy of instructions were sent to the patient via MyChart unless otherwise noted below.   The patient was advised to call back or seek an in-person evaluation if the symptoms worsen or if the condition fails to improve as anticipated.  Time:  I spent 10 minutes with the patient via telehealth technology discussing the above problems/concerns.    Julie Rio, PA-C

## 2021-01-27 NOTE — Patient Instructions (Signed)
Hinda Lenis, thank you for joining Piedad Climes, PA-C for today's virtual visit.  While this provider is not your primary care provider (PCP), if your PCP is located in our provider database this encounter information will be shared with them immediately following your visit.  Consent: (Patient) Tejasvi Brissett provided verbal consent for this virtual visit at the beginning of the encounter.  Current Medications:  Current Outpatient Medications:    buPROPion ER (WELLBUTRIN SR) 100 MG 12 hr tablet, TAKE 1 TABLET(100 MG) BY MOUTH TWICE DAILY, Disp: 60 tablet, Rfl: 2   cetirizine (ZYRTEC) 10 MG tablet, Take by mouth daily., Disp: , Rfl:    fluticasone (FLONASE) 50 MCG/ACT nasal spray, Place into both nostrils daily., Disp: , Rfl:    naltrexone (DEPADE) 50 MG tablet, Take 0.5 tablets (25 mg total) by mouth 2 (two) times daily., Disp: 30 tablet, Rfl: 2   norethindrone (NORLYDA) 0.35 MG tablet, Take 1 tablet (0.35 mg total) by mouth daily., Disp: 84 tablet, Rfl: 2   PARAGARD INTRAUTERINE COPPER IU, by Intrauterine route., Disp: , Rfl:    Medications ordered in this encounter:  No orders of the defined types were placed in this encounter.    *If you need refills on other medications prior to your next appointment, please contact your pharmacy*  Follow-Up: Call back or seek an in-person evaluation if the symptoms worsen or if the condition fails to improve as anticipated.  Other Instructions Please take the repeat COVID test and message me back with the results ASAP to make sure we do not need to make further adjustments in treatment.   Take antibiotic (Azithromycin) as directed.  Increase fluids.  Get plenty of rest. Use Mucinex for congestion. Tessalon for cough (prescription). Take a daily probiotic (I recommend Align or Culturelle, but even Activia Yogurt may be beneficial).  A humidifier placed in the bedroom may offer some relief for a dry, scratchy throat of nasal  irritation.  Read information below on acute bronchitis. Please call or return to clinic if symptoms are not improving.  Acute Bronchitis Bronchitis is when the airways that extend from the windpipe into the lungs get red, puffy, and painful (inflamed). Bronchitis often causes thick spit (mucus) to develop. This leads to a cough. A cough is the most common symptom of bronchitis. In acute bronchitis, the condition usually begins suddenly and goes away over time (usually in 2 weeks). Smoking, allergies, and asthma can make bronchitis worse. Repeated episodes of bronchitis may cause more lung problems.  HOME CARE Rest. Drink enough fluids to keep your pee (urine) clear or pale yellow (unless you need to limit fluids as told by your doctor). Only take over-the-counter or prescription medicines as told by your doctor. Avoid smoking and secondhand smoke. These can make bronchitis worse. If you are a smoker, think about using nicotine gum or skin patches. Quitting smoking will help your lungs heal faster. Reduce the chance of getting bronchitis again by: Washing your hands often. Avoiding people with cold symptoms. Trying not to touch your hands to your mouth, nose, or eyes. Follow up with your doctor as told.  GET HELP IF: Your symptoms do not improve after 1 week of treatment. Symptoms include: Cough. Fever. Coughing up thick spit. Body aches. Chest congestion. Chills. Shortness of breath. Sore throat.  GET HELP RIGHT AWAY IF:  You have an increased fever. You have chills. You have severe shortness of breath. You have bloody thick spit (sputum). You throw up (vomit) often.  You lose too much body fluid (dehydration). You have a severe headache. You faint.  MAKE SURE YOU:  Understand these instructions. Will watch your condition. Will get help right away if you are not doing well or get worse. Document Released: 07/25/2007 Document Revised: 10/08/2012 Document Reviewed:  07/29/2012 [facility] Patient Information 2015 [place], [place]. This information is not intended to replace advice given to you by your health care provider. Make sure you discuss any questions you have with your health care provider.    If you have been instructed to have an in-person evaluation today at a local Urgent Care facility, please use the link below. It will take you to a list of all of our available [facility], including address, phone number and hours of operation. Please do not delay care.  [facility]  If you or a family member do not have a primary care provider, use the link below to schedule a visit and establish care. When you choose a [facility] primary care physician or advanced practice provider, you gain a long-term partner in health. Find a Primary Care Provider  Learn more about [facility]'s in-office and virtual care options: [facility] - Get Care Now

## 2021-02-14 ENCOUNTER — Other Ambulatory Visit: Payer: Self-pay

## 2021-02-14 ENCOUNTER — Encounter: Payer: Self-pay | Admitting: Internal Medicine

## 2021-02-14 ENCOUNTER — Ambulatory Visit: Payer: Medicaid Other | Admitting: Internal Medicine

## 2021-02-14 VITALS — BP 130/85 | HR 89 | Temp 98.2°F | Resp 20 | Ht 64.0 in | Wt 212.2 lb

## 2021-02-14 DIAGNOSIS — R058 Other specified cough: Secondary | ICD-10-CM | POA: Diagnosis not present

## 2021-02-14 DIAGNOSIS — J4520 Mild intermittent asthma, uncomplicated: Secondary | ICD-10-CM

## 2021-02-14 DIAGNOSIS — J40 Bronchitis, not specified as acute or chronic: Secondary | ICD-10-CM

## 2021-02-14 MED ORDER — ALBUTEROL SULFATE HFA 108 (90 BASE) MCG/ACT IN AERS: 2.0000 | INHALATION_SPRAY | Freq: Four times a day (QID) | RESPIRATORY_TRACT | 0 refills | Status: DC | PRN

## 2021-02-14 MED ORDER — FLUTICASONE PROPIONATE HFA 44 MCG/ACT IN AERO: 1.0000 | INHALATION_SPRAY | Freq: Two times a day (BID) | RESPIRATORY_TRACT | 12 refills | Status: DC

## 2021-02-14 NOTE — Patient Instructions (Signed)

## 2021-02-14 NOTE — Progress Notes (Signed)
Subjective:    Patient ID: [name], female    DOB: 03-15-1975, 45 y.o.   MRN: [id number]  HPI  Patient presents the clinic today with complaint of fatigue, runny nose, nasal congestion, chest tightness and cough.  This started 3 weeks ago.  The cough is mostly nonproductive.  She reports the cough is worse when she laughs, talks, eats or is outside.  She was seen virtually 12/9 for the same.  She was diagnosed with bronchitis and treated with Azithromycin and Tessalon.  She does have a history of asthma. She takes Zyrtec daily and Albuterol as needed.  Review of Systems     Past Medical History:  Diagnosis Date   Depression    Diverticulosis 2018   Frequent headaches    Gestational diabetes    Negative 2 hr GTT postpartum on 06/11/2019   Heart murmur    History of chicken pox     Current Outpatient Medications  Medication Sig Dispense Refill   benzonatate (TESSALON) 100 MG capsule Take 1 capsule (100 mg total) by mouth 3 (three) times daily as needed for cough. 30 capsule 0   buPROPion ER (WELLBUTRIN SR) 100 MG 12 hr tablet TAKE 1 TABLET(100 MG) BY MOUTH TWICE DAILY 60 tablet 2   cetirizine (ZYRTEC) 10 MG tablet Take by mouth daily.     COVID-19 At Home Antigen Test KIT Use as directed. 1 kit 0   fluticasone (FLONASE) 50 MCG/ACT nasal spray Place into both nostrils daily.     naltrexone (DEPADE) 50 MG tablet Take 0.5 tablets (25 mg total) by mouth 2 (two) times daily. 30 tablet 2   PARAGARD INTRAUTERINE COPPER IU by Intrauterine route.     No current facility-administered medications for this visit.    No Known Allergies  Family History  Problem Relation Age of Onset   Heart disease Mother        Unsure    Social History   Socioeconomic History   Marital status: Divorced    Spouse name: Not on file   Number of children: Not on file   Years of education: Not on file   Highest education level: Not on file  Occupational History   Not on file  Tobacco  Use   Smoking status: Never   Smokeless tobacco: Never  Vaping Use   Vaping Use: Never used  Substance and Sexual Activity   Alcohol use: Not Currently   Drug use: Never   Sexual activity: Yes    Birth control/protection: None  Other Topics Concern   Not on file  Social History Narrative   Not on file   Social Determinants of Health   Financial Resource Strain: Not on file  Food Insecurity: Not on file  Transportation Needs: Not on file  Physical Activity: Not on file  Stress: Not on file  Social Connections: Not on file  Intimate Partner Violence: Not on file     Constitutional: Patient reports fatigue.  Denies fever, malaise, headache or abrupt weight changes.  HEENT: Patient reports runny nose, nasal congestion.  Denies eye pain, eye redness, ear pain, ringing in the ears, wax buildup, bloody nose, or sore throat. Respiratory: Patient reports cough.  Denies difficulty breathing, shortness of breath, or sputum production.   Cardiovascular: Patient reports chest tightness.  Denies chest pain, palpitations or swelling in the hands or feet.   No other specific complaints in a complete review of systems (except as listed in HPI above).  Objective:  Physical Exam   BP 130/85 (BP Location: Right Arm, Patient Position: Sitting, Cuff Size: Large)    Pulse 89    Temp 98.2 F (36.8 C) (Temporal)    Resp 20    Ht _0  (1.626 m)    Wt 212 lb 3.2 oz (96.3 kg)    SpO2 100%    BMI 36.42 kg/m   Wt Readings from Last 3 Encounters:  12/15/20 213 lb (96.6 kg)  11/15/20 213 lb (96.6 kg)  09/20/20 213 lb 3.2 oz (96.7 kg)    General: Appears her stated age, obese, in NAD. Skin: Warm, dry and intact. HEENT: Head: normal shape and size; Eyes: sclera white, EOMs intact;  Neck: No adenopathy noted Cardiovascular: Normal rate and rhythm. S1,S2 noted.  No murmur, rubs or gallops noted.  Pulmonary/Chest: Normal effort and positive vesicular breath sounds. No respiratory distress. No  wheezes, rales or ronchi noted.  Neurological: Alert and oriented.  BMET    Component Value Date/Time   NA 138 04/26/2020 1116   NA 138 02/26/2019 1052   NA 140 06/04/2014 1335   K 4.2 04/26/2020 1116   K 4.2 06/04/2014 1335   CL 103 04/26/2020 1116   CL 107 06/04/2014 1335   CO2 28 04/26/2020 1116   CO2 27 06/04/2014 1335   GLUCOSE 94 04/26/2020 1116   GLUCOSE 106 (H) 06/04/2014 1335   BUN 9 04/26/2020 1116   BUN 6 02/26/2019 1052   BUN 9 06/04/2014 1335   CREATININE 0.69 04/26/2020 1116   CREATININE 0.60 06/04/2014 1335   CALCIUM 9.5 04/26/2020 1116   CALCIUM 9.4 06/04/2014 1335   GFRNONAA >60 04/25/2019 0207   GFRNONAA >60 06/04/2014 1335   GFRAA >60 04/25/2019 0207   GFRAA >60 06/04/2014 1335    Lipid Panel     Component Value Date/Time   CHOL 189 04/26/2020 1116   TRIG 64.0 04/26/2020 1116   HDL 48.40 04/26/2020 1116   CHOLHDL 4 04/26/2020 1116   VLDL 12.8 04/26/2020 1116   LDLCALC 127 (H) 04/26/2020 1116    CBC    Component Value Date/Time   WBC 6.8 04/26/2020 1116   RBC 4.45 04/26/2020 1116   HGB 14.1 04/26/2020 1116   HGB 13.3 02/05/2019 0840   HCT 41.0 04/26/2020 1116   HCT 38.7 02/05/2019 0840   PLT 228.0 04/26/2020 1116   PLT 211 02/05/2019 0840   MCV 92.1 04/26/2020 1116   MCV 95 02/05/2019 0840   MCV 91 06/04/2014 1335   MCH 33.1 04/25/2019 0207   MCHC 34.3 04/26/2020 1116   RDW 12.6 04/26/2020 1116   RDW 11.8 02/05/2019 0840   RDW 12.6 06/04/2014 1335   LYMPHSABS 1.3 10/09/2018 1048   LYMPHSABS 2.0 06/04/2014 1335   MONOABS 0.8 05/31/2016 1546   MONOABS 0.5 06/04/2014 1335   EOSABS 0.0 10/09/2018 1048   EOSABS 0.1 06/04/2014 1335   BASOSABS 0.0 10/09/2018 1048   BASOSABS 0.1 06/04/2014 1335    Hgb A1C Lab Results  Component Value Date   HGBA1C 5.2 04/26/2020           Assessment & Plan:   Postviral Cough Syndrome status post Bronchitis, Asthma:  No indication for additional antibiotics at this time Flovent 44 mcg 1  puff twice daily as needed Continue Zyrtec Start Flonase OTC Albuterol refilled today  Return precautions discussed Webb Silversmith, NP This visit occurred during the SARS-CoV-2 public health emergency.  Safety protocols were in place, including screening questions prior to the visit,  additional usage of staff PPE, and extensive cleaning of exam room while observing appropriate contact time as indicated for disinfecting solutions.

## 2021-03-13 ENCOUNTER — Other Ambulatory Visit: Payer: Self-pay | Admitting: Internal Medicine

## 2021-03-13 NOTE — Telephone Encounter (Signed)
Requested Prescriptions  Pending Prescriptions Disp Refills   VENTOLIN HFA 108 (90 Base) MCG/ACT inhaler [Pharmacy Med Name: VENTOLIN HFA INH W/DOS CTR 200PUFFS] 18 g 0    Sig: INHALE 2 PUFFS INTO THE LUNGS EVERY 6 HOURS AS NEEDED FOR WHEEZING OR SHORTNESS OF BREATH     Pulmonology:  Beta Agonists Failed - 03/13/2021  3:30 AM      Failed - One inhaler should last at least one month. If the patient is requesting refills earlier, contact the patient to check for uncontrolled symptoms.      Passed - Valid encounter within last 12 months    Recent Outpatient Visits          3 weeks ago Post-viral cough syndrome   [facility] [place], [place] W, NP   5 months ago Class 2 obesity due to excess calories without serious comorbidity with body mass index (BMI) of 36.0 to 36.9 in adult   [facility], [place], [place]

## 2021-04-17 ENCOUNTER — Other Ambulatory Visit: Payer: Self-pay | Admitting: Internal Medicine

## 2021-04-18 NOTE — Telephone Encounter (Signed)
Requested Prescriptions  Pending Prescriptions Disp Refills   VENTOLIN HFA 108 (90 Base) MCG/ACT inhaler [Pharmacy Med Name: VENTOLIN HFA INH W/DOS CTR 200PUFFS] 18 g 0    Sig: INHALE 2 PUFFS INTO THE LUNGS EVERY 6 HOURS AS NEEDED FOR WHEEZING OR SHORTNESS OF BREATH     Pulmonology:  Beta Agonists 2 Passed - 04/17/2021  3:29 AM      Passed - Last BP in normal range    BP Readings from Last 1 Encounters:  02/14/21 130/85         Passed - Last Heart Rate in normal range    Pulse Readings from Last 1 Encounters:  02/14/21 89         Passed - Valid encounter within last 12 months    Recent Outpatient Visits          2 months ago Post-viral cough syndrome   [facility] [place], [place], NP   7 months ago Class 2 obesity due to excess calories without serious comorbidity with body mass index (BMI) of 36.0 to 36.9 in adult   [facility] [place], [place], [place]

## 2021-05-12 ENCOUNTER — Other Ambulatory Visit: Payer: Self-pay | Admitting: Internal Medicine

## 2021-05-15 NOTE — Telephone Encounter (Signed)
Requested Prescriptions  ?Pending Prescriptions Disp Refills  ?? VENTOLIN HFA 108 (90 Base) MCG/ACT inhaler [Pharmacy Med Name: VENTOLIN HFA INH W/DOS CTR 200PUFFS] 18 g 0  ?  Sig: INHALE 2 PUFFS INTO THE LUNGS EVERY 6 HOURS AS NEEDED FOR WHEEZING OR SHORTNESS OF BREATH  ?  ? Pulmonology:  Beta Agonists 2 Passed - 05/12/2021  3:29 AM  ?  ?  Passed - Last BP in normal range  ?  BP Readings from Last 1 Encounters:  ?02/14/21 130/85  ?   ?  ?  Passed - Last Heart Rate in normal range  ?  Pulse Readings from Last 1 Encounters:  ?02/14/21 89  ?   ?  ?  Passed - Valid encounter within last 12 months  ?  Recent Outpatient Visits   ?      ? 3 months ago Post-viral cough syndrome  ? [facility] [place], [place] W, NP  ? 7 months ago Class 2 obesity due to excess calories without serious comorbidity with body mass index (BMI) of 36.0 to 36.9 in adult  ? [facility] [place], [place], NP  ?  ?  ? ?  ?  ?  ? ?

## 2021-05-17 ENCOUNTER — Other Ambulatory Visit: Payer: Self-pay | Admitting: Internal Medicine

## 2021-05-18 NOTE — Telephone Encounter (Signed)
Requested medications are due for refill today.  yes ? ?Requested medications are on the active medications list.  yes ? ?Last refill. 02/14/2021 #60 2 refills ? ?Future visit scheduled.   no ? ?Notes to clinic.  Labs are expired. Medication listed as both historical and written by Nicki Reaper. ? ? ? ?Requested Prescriptions  ?Pending Prescriptions Disp Refills  ? buPROPion ER (WELLBUTRIN SR) 100 MG 12 hr tablet [Pharmacy Med Name: BUPROPION SR 100MG  TABLETS (12 H)] 60 tablet 2  ?  Sig: TAKE 1 TABLET(100 MG) BY MOUTH TWICE DAILY  ?  ? Psychiatry: Antidepressants - bupropion Failed - 05/17/2021  3:29 AM  ?  ?  Failed - Cr in normal range and within 360 days  ?  Creatinine  ?Date Value Ref Range Status  ?06/04/2014 0.60 mg/dL Final  ?  Comment:  ?  0.44-1.00 ?NOTE: New Reference Range ? 04/27/14 ?  ? ?Creatinine, Ser  ?Date Value Ref Range Status  ?04/26/2020 0.69 0.40 - 1.20 mg/dL Final  ?  ?  ?  ?  Failed - AST in normal range and within 360 days  ?  AST  ?Date Value Ref Range Status  ?04/26/2020 13 0 - 37 U/L Final  ? ?SGOT(AST)  ?Date Value Ref Range Status  ?06/04/2014 20 U/L Final  ?  Comment:  ?  15-41 ?NOTE: New Reference Range ? 04/27/14 ?  ?  ?  ?  ?  Failed - ALT in normal range and within 360 days  ?  ALT  ?Date Value Ref Range Status  ?04/26/2020 13 0 - 35 U/L Final  ? ?SGPT (ALT)  ?Date Value Ref Range Status  ?06/04/2014 19 U/L Final  ?  Comment:  ?  14-54 ?NOTE: New Reference Range ? 04/27/14 ?  ?  ?  ?  ?  Passed - Completed PHQ-2 or PHQ-9 in the last 360 days  ?  ?  Passed - Last BP in normal range  ?  BP Readings from Last 1 Encounters:  ?02/14/21 130/85  ?  ?  ?  ?  Passed - Valid encounter within last 6 months  ?  Recent Outpatient Visits   ? ?      ? 3 months ago Post-viral cough syndrome  ? [facility] [place], [place], NP  ? 8 months ago Class 2 obesity due to excess calories without serious comorbidity with body mass index (BMI) of 36.0 to 36.9 in adult  ? [facility] [place], [place], NP  ? ?  ?  ? ?  ?  ?  ?  ?

## 2021-05-22 ENCOUNTER — Telehealth: Payer: Self-pay

## 2021-05-22 NOTE — Telephone Encounter (Signed)
I have never seen her for this issue.  Please have her schedule her physical which is overdue and we can discuss this issue, I will place referral at that time. ?

## 2021-05-22 NOTE — Telephone Encounter (Signed)
Copied from CRM [id number]. Topic: Referral - Request for Referral ?>> May 19, 2021 10:02 AM [name] wrote: ?Has patient seen PCP for this complaint? No  ?*If NO, is insurance requiring patient see PCP for this issue before PCP can refer them? ?Referral for which specialty: Chiropractor  ?Preferred provider/office: [facility] on [address] / fax# [phone] ?Reason for referral: Pt has been seeing them for neck stress and pinched nerve / pt needs referral to be seen again ?

## 2021-05-23 NOTE — Telephone Encounter (Signed)
Apt scheduled for 06/13/2021 at 9:20.  Pt advised.  ? ?Thanks,  ? ?-Vernona Rieger  ?

## 2021-06-13 ENCOUNTER — Encounter: Payer: Self-pay | Admitting: Internal Medicine

## 2021-06-13 ENCOUNTER — Ambulatory Visit (INDEPENDENT_AMBULATORY_CARE_PROVIDER_SITE_OTHER): Payer: Medicaid Other | Admitting: Internal Medicine

## 2021-06-13 VITALS — BP 128/82 | HR 78 | Ht 64.0 in | Wt 208.8 lb

## 2021-06-13 DIAGNOSIS — Z6835 Body mass index (BMI) 35.0-35.9, adult: Secondary | ICD-10-CM | POA: Diagnosis not present

## 2021-06-13 DIAGNOSIS — M5412 Radiculopathy, cervical region: Secondary | ICD-10-CM | POA: Insufficient documentation

## 2021-06-13 DIAGNOSIS — E66812 Morbid (severe) obesity due to excess calories: Secondary | ICD-10-CM

## 2021-06-13 DIAGNOSIS — Z1211 Encounter for screening for malignant neoplasm of colon: Secondary | ICD-10-CM

## 2021-06-13 DIAGNOSIS — Z0001 Encounter for general adult medical examination with abnormal findings: Secondary | ICD-10-CM | POA: Diagnosis not present

## 2021-06-13 LAB — CBC
HCT: 38.3 % (ref 35.0–45.0)
Hemoglobin: 12.4 g/dL (ref 11.7–15.5)
MCH: 28.4 pg (ref 27.0–33.0)
MCHC: 32.4 g/dL (ref 32.0–36.0)
MCV: 87.6 fL (ref 80.0–100.0)
MPV: 11 fL (ref 7.5–12.5)
Platelets: 236 10*3/uL (ref 140–400)
RBC: 4.37 10*6/uL (ref 3.80–5.10)
RDW: 12.9 % (ref 11.0–15.0)
WBC: 7.6 10*3/uL (ref 3.8–10.8)

## 2021-06-13 LAB — LIPID PANEL
Cholesterol: 178 mg/dL (ref <200)
HDL: 49 mg/dL — ABNORMAL LOW (ref >=50)
LDL Cholesterol (Calc): 111 mg/dL (calc) — ABNORMAL HIGH
Non-HDL Cholesterol (Calc): 129 mg/dL (calc) (ref <130)
Total CHOL/HDL Ratio: 3.6 (calc) (ref <5.0)
Triglycerides: 89 mg/dL (ref <150)

## 2021-06-13 LAB — COMPLETE METABOLIC PANEL WITH GFR
AG Ratio: 1.5 (calc) (ref 1.0–2.5)
ALT: 10 U/L (ref 6–29)
AST: 13 U/L (ref 10–30)
Albumin: 4.2 g/dL (ref 3.6–5.1)
Alkaline phosphatase (APISO): 77 U/L (ref 31–125)
BUN: 10 mg/dL (ref 7–25)
CO2: 27 mmol/L (ref 20–32)
Calcium: 9 mg/dL (ref 8.6–10.2)
Chloride: 104 mmol/L (ref 98–110)
Creat: 0.73 mg/dL (ref 0.50–0.99)
Globulin: 2.8 g/dL (calc) (ref 1.9–3.7)
Glucose, Bld: 89 mg/dL (ref 65–99)
Potassium: 4.1 mmol/L (ref 3.5–5.3)
Sodium: 138 mmol/L (ref 135–146)
Total Bilirubin: 0.4 mg/dL (ref 0.2–1.2)
Total Protein: 7 g/dL (ref 6.1–8.1)
eGFR: 104 mL/min/{1.73_m2} (ref >=60)

## 2021-06-13 MED ORDER — PHENTERMINE HCL 15 MG PO CAPS
15.0000 mg | ORAL_CAPSULE | ORAL | 0 refills | Status: DC
Start: 2021-06-13 — End: 2021-07-12

## 2021-06-13 NOTE — Assessment & Plan Note (Signed)
Encourage diet and exercise for weight loss ?We will D/C Contrave ?We will try Phentermine ?

## 2021-06-13 NOTE — Assessment & Plan Note (Signed)
Referral to chiropractic placed per patient request ?

## 2021-06-13 NOTE — Progress Notes (Signed)
? ?Subjective:  ? ? Patient ID: [name], female    DOB: 1975-05-26, 46 y.o.   MRN: [id number] ? ?HPI ? ?Pt presents to the clinic today for her annual exam.  She would also like to discuss her weight.  She is currently on Contrave for this.  She does not feel like it is effective.  She would like to consider alternative options. ? ?Flu: never ?Tetanus: 02/2019 ?Covid: Pfizer x 2 ?Pap smear: 09/2020 ?Mammogram: 11/2020 ?Colon screening: 11/2016 ?Vision screening: as needed ?Dentist: biannually ? ?Diet: She does eat meat. She consumes fruits and veggies. She does eat some fried foods. She drinks mostly water. ?Exercise: body weight exercises, cardio ? ?Review of Systems ? ?   ?Past Medical History:  ?Diagnosis Date  ? Depression   ? Diverticulosis 2018  ? Frequent headaches   ? Gestational diabetes   ? Negative 2 hr GTT postpartum on 06/11/2019  ? Heart murmur   ? History of chicken pox   ? ? ?Current Outpatient Medications  ?Medication Sig Dispense Refill  ? buPROPion ER (WELLBUTRIN SR) 100 MG 12 hr tablet TAKE 1 TABLET(100 MG) BY MOUTH TWICE DAILY ?Strength: 100 mg 60 tablet 2  ? cetirizine (ZYRTEC) 10 MG tablet Take by mouth daily.    ? fluticasone (FLONASE) 50 MCG/ACT nasal spray Place into both nostrils daily.    ? naltrexone (DEPADE) 50 MG tablet Take 0.5 tablets (25 mg total) by mouth 2 (two) times daily. 30 tablet 2  ? PARAGARD INTRAUTERINE COPPER IU by Intrauterine route.    ? ?No current facility-administered medications for this visit.  ? ? ?No Known Allergies ? ?Family History  ?Problem Relation Age of Onset  ? Heart disease Mother   ?     Unsure  ? ? ?Social History  ? ?Socioeconomic History  ? Marital status: Divorced  ?  Spouse name: Not on file  ? Number of children: Not on file  ? Years of education: Not on file  ? Highest education level: Not on file  ?Occupational History  ? Not on file  ?Tobacco Use  ? Smoking status: Never  ? Smokeless tobacco: Never  ?Vaping Use  ? Vaping Use: Never used   ?Substance and Sexual Activity  ? Alcohol use: Not Currently  ? Drug use: Never  ? Sexual activity: Yes  ?  Birth control/protection: None  ?Other Topics Concern  ? Not on file  ?Social History Narrative  ? Not on file  ? ?Social Determinants of Health  ? ?Financial Resource Strain: Not on file  ?Food Insecurity: Not on file  ?Transportation Needs: Not on file  ?Physical Activity: Not on file  ?Stress: Not on file  ?Social Connections: Not on file  ?Intimate Partner Violence: Not on file  ? ? ? ?Constitutional: Denies fever, malaise, fatigue, headache or abrupt weight changes.  ?HEENT: Denies eye pain, eye redness, ear pain, ringing in the ears, wax buildup, runny nose, nasal congestion, bloody nose, or sore throat. ?Respiratory: Denies difficulty breathing, shortness of breath, cough or sputum production.   ?Cardiovascular: Denies chest pain, chest tightness, palpitations or swelling in the hands or feet.  ?Gastrointestinal: Denies abdominal pain, bloating, constipation, diarrhea or blood in the stool.  ?GU: Denies urgency, frequency, pain with urination, burning sensation, blood in urine, odor or discharge. ?Musculoskeletal: Patient reports chronic neck pain.  Denies decrease in range of motion, difficulty with gait, muscle pain or joint swelling.  ?Skin: Denies redness, rashes, lesions or ulcercations.  ?  Neurological: Patient reports cervical radiculitis.  Denies dizziness, difficulty with memory, difficulty with speech or problems with balance and coordination.  ?Psych: Pt has a history of depression. Denies anxiety, SI/HI. ? ?No other specific complaints in a complete review of systems (except as listed in HPI above). ? ?Objective:  ? Physical Exam ?BP 128/82   Pulse 78   Ht 5\' 4"  (1.626 m)   Wt 208 lb 12.8 oz (94.7 kg)   SpO2 99%   BMI 35.84 kg/m?  ? ?Wt Readings from Last 3 Encounters:  ?02/14/21 212 lb 3.2 oz (96.3 kg)  ?12/15/20 213 lb (96.6 kg)  ?11/15/20 213 lb (96.6 kg)  ? ? ?General: Appears her  stated age, obese, in NAD. ?Skin: Warm, dry and intact.  ?HEENT: Head: normal shape and size; Eyes: sclera white, no icterus, conjunctiva pink, PERRLA and EOMs intact;  ?Neck:  Neck supple, trachea midline. No masses, lumps or thyromegaly present.  ?Cardiovascular: Normal rate and rhythm. S1,S2 noted.  No murmur, rubs or gallops noted. No JVD or BLE edema.  ?Pulmonary/Chest: Normal effort and positive vesicular breath sounds. No respiratory distress. No wheezes, rales or ronchi noted.  ?Abdomen: Soft and nontender. Normal bowel sounds.  ?Musculoskeletal: Strength 5/5 BUE/BLE.  No difficulty with gait.  ?Neurological: Alert and oriented. Cranial nerves II-XII grossly intact. Coordination normal.  ?Psychiatric: Mood and affect normal. Behavior is normal. Judgment and thought content normal.  ? ?BMET ?   ?Component Value Date/Time  ? NA 138 04/26/2020 1116  ? NA 138 02/26/2019 1052  ? NA 140 06/04/2014 1335  ? K 4.2 04/26/2020 1116  ? K 4.2 06/04/2014 1335  ? CL 103 04/26/2020 1116  ? CL 107 06/04/2014 1335  ? CO2 28 04/26/2020 1116  ? CO2 27 06/04/2014 1335  ? GLUCOSE 94 04/26/2020 1116  ? GLUCOSE 106 (H) 06/04/2014 1335  ? BUN 9 04/26/2020 1116  ? BUN 6 02/26/2019 1052  ? BUN 9 06/04/2014 1335  ? CREATININE 0.69 04/26/2020 1116  ? CREATININE 0.60 06/04/2014 1335  ? CALCIUM 9.5 04/26/2020 1116  ? CALCIUM 9.4 06/04/2014 1335  ? GFRNONAA >60 04/25/2019 0207  ? GFRNONAA >60 06/04/2014 1335  ? GFRAA >60 04/25/2019 0207  ? GFRAA >60 06/04/2014 1335  ? ? ?Lipid Panel  ?   ?Component Value Date/Time  ? CHOL 189 04/26/2020 1116  ? TRIG 64.0 04/26/2020 1116  ? HDL 48.40 04/26/2020 1116  ? CHOLHDL 4 04/26/2020 1116  ? VLDL 12.8 04/26/2020 1116  ? LDLCALC 127 (H) 04/26/2020 1116  ? ? ?CBC ?   ?Component Value Date/Time  ? WBC 6.8 04/26/2020 1116  ? RBC 4.45 04/26/2020 1116  ? HGB 14.1 04/26/2020 1116  ? HGB 13.3 02/05/2019 0840  ? HCT 41.0 04/26/2020 1116  ? HCT 38.7 02/05/2019 0840  ? PLT 228.0 04/26/2020 1116  ? PLT 211  02/05/2019 0840  ? MCV 92.1 04/26/2020 1116  ? MCV 95 02/05/2019 0840  ? MCV 91 06/04/2014 1335  ? MCH 33.1 04/25/2019 0207  ? MCHC 34.3 04/26/2020 1116  ? RDW 12.6 04/26/2020 1116  ? RDW 11.8 02/05/2019 0840  ? RDW 12.6 06/04/2014 1335  ? LYMPHSABS 1.3 10/09/2018 1048  ? LYMPHSABS 2.0 06/04/2014 1335  ? MONOABS 0.8 05/31/2016 1546  ? MONOABS 0.5 06/04/2014 1335  ? EOSABS 0.0 10/09/2018 1048  ? EOSABS 0.1 06/04/2014 1335  ? BASOSABS 0.0 10/09/2018 1048  ? BASOSABS 0.1 06/04/2014 1335  ? ? ?Hgb A1C ?Lab Results  ?Component Value Date  ?  HGBA1C 5.2 04/26/2020  ? ? ? ? ? ? ? ?   ?Assessment & Plan:  ? ?Preventative Health Maintenance: ? ?Encouraged her to get a flu shot in the fall ?Tetanus UTD ?Encouraged her to get a covid booster ?Pap smear UTD ?Mammogram due 11/2021, ordered by GYN ?Referral to GI screening colonoscopy ?Encouraged her to consume a balanced diet and exercise regimen ?Advised her to see an eye doctor and dentist annually ?Will check CBC, CMET, Lipid profile today ? ?RTC in 6 weeks for weight check ? ?Nicki Reaper, NP ? ? ?

## 2021-06-13 NOTE — Patient Instructions (Signed)

## 2021-06-15 ENCOUNTER — Other Ambulatory Visit: Payer: Self-pay

## 2021-06-15 DIAGNOSIS — Z1211 Encounter for screening for malignant neoplasm of colon: Secondary | ICD-10-CM

## 2021-06-15 MED ORDER — NA SULFATE-K SULFATE-MG SULF 17.5-3.13-1.6 GM/177ML PO SOLN: 1.0000 | Freq: Once | ORAL | 0 refills | Status: AC

## 2021-06-15 NOTE — Progress Notes (Signed)
Gastroenterology Pre-Procedure Review ? ?Request Date: 11/03/2021 ?Requesting Physician: Dr. Vicente Males ? ?PATIENT REVIEW QUESTIONS: The patient responded to the following health history questions as indicated:   ? ?1. Are you having any GI issues? no ?2. Do you have a personal history of Polyps? no ?3. Do you have a family history of Colon Cancer or Polyps? no ?4. Diabetes Mellitus? no ?5. Joint replacements in the past 12 months?no ?6. Major health problems in the past 3 months?no ?7. Any artificial heart valves, MVP, or defibrillator?no ?   ?MEDICATIONS & ALLERGIES:    ?Patient reports the following regarding taking any anticoagulation/antiplatelet therapy:   ?Plavix, Coumadin, Eliquis, Xarelto, Lovenox, Pradaxa, Brilinta, or Effient? no ?Aspirin? no ? ?Patient confirms/reports the following medications:  ?Current Outpatient Medications  ?Medication Sig Dispense Refill  ? cetirizine (ZYRTEC) 10 MG tablet Take by mouth daily.    ? fluticasone (FLONASE) 50 MCG/ACT nasal spray Place into both nostrils daily.    ? PARAGARD INTRAUTERINE COPPER IU by Intrauterine route.    ? phentermine 15 MG capsule Take 1 capsule (15 mg total) by mouth every morning. 30 capsule 0  ? ?No current facility-administered medications for this visit.  ? ? ?Patient confirms/reports the following allergies:  ?No Known Allergies ? ?No orders of the defined types were placed in this encounter. ? ? ?AUTHORIZATION INFORMATION ?Primary Insurance: ?1D#: ?Group #: ? ?Secondary Insurance: ?1D#: ?Group #: ? ?SCHEDULE INFORMATION: ?Date: 11/03/2021 ?Time: ?Location:[facility] ? ?

## 2021-06-16 ENCOUNTER — Other Ambulatory Visit: Payer: Self-pay | Admitting: Internal Medicine

## 2021-06-16 NOTE — Telephone Encounter (Signed)
Discontinued 06/13/21. Not on current med profile. ?Requested Prescriptions  ?Pending Prescriptions Disp Refills  ?? VENTOLIN HFA 108 (90 Base) MCG/ACT inhaler [Pharmacy Med Name: VENTOLIN HFA INH W/DOS CTR 200PUFFS] 18 g 0  ?  Sig: INHALE 2 PUFFS INTO THE LUNGS EVERY 6 HOURS AS NEEDED FOR WHEEZING OR SHORTNESS OF BREATH  ?  ? Pulmonology:  Beta Agonists 2 Passed - 06/16/2021  3:30 AM  ?  ?  Passed - Last BP in normal range  ?  BP Readings from Last 1 Encounters:  ?06/13/21 128/82  ?   ?  ?  Passed - Last Heart Rate in normal range  ?  Pulse Readings from Last 1 Encounters:  ?06/13/21 78  ?   ?  ?  Passed - Valid encounter within last 12 months  ?  Recent Outpatient Visits   ?      ? 3 days ago Encounter for general adult medical examination with abnormal findings  ? [facility] [place], [place] W, NP  ? 4 months ago Post-viral cough syndrome  ? [facility] [place], [place], NP  ? 8 months ago Class 2 obesity due to excess calories without serious comorbidity with body mass index (BMI) of 36.0 to 36.9 in adult  ? [facility] [place], [place], NP  ?  ?  ? ?  ?  ?  ? ? ?

## 2021-07-04 ENCOUNTER — Other Ambulatory Visit: Payer: Self-pay | Admitting: *Deleted

## 2021-07-04 MED ORDER — VALACYCLOVIR HCL 1 G PO TABS: 1000.0000 mg | ORAL_TABLET | Freq: Every day | ORAL | 3 refills | Status: DC

## 2021-07-07 ENCOUNTER — Other Ambulatory Visit: Payer: Self-pay

## 2021-07-07 ENCOUNTER — Emergency Department
Admission: EM | Admit: 2021-07-07 | Discharge: 2021-07-07 | Disposition: A | Discharge status: Home / Self Care | Payer: Medicaid Other | Attending: Student in an Organized Health Care Education/Training Program | Admitting: Student in an Organized Health Care Education/Training Program

## 2021-07-07 ENCOUNTER — Emergency Department: Payer: Medicaid Other

## 2021-07-07 ENCOUNTER — Encounter: Payer: Self-pay | Admitting: Intensive Care

## 2021-07-07 ENCOUNTER — Telehealth: Payer: Self-pay

## 2021-07-07 DIAGNOSIS — H538 Other visual disturbances: Secondary | ICD-10-CM | POA: Diagnosis not present

## 2021-07-07 DIAGNOSIS — G9389 Other specified disorders of brain: Secondary | ICD-10-CM | POA: Diagnosis not present

## 2021-07-07 DIAGNOSIS — R220 Localized swelling, mass and lump, head: Secondary | ICD-10-CM | POA: Diagnosis not present

## 2021-07-07 DIAGNOSIS — H469 Unspecified optic neuritis: Secondary | ICD-10-CM | POA: Diagnosis not present

## 2021-07-07 DIAGNOSIS — G379 Demyelinating disease of central nervous system, unspecified: Secondary | ICD-10-CM | POA: Diagnosis not present

## 2021-07-07 LAB — BASIC METABOLIC PANEL
Anion gap: 8 (ref 5–15)
BUN: 11 mg/dL (ref 6–20)
CO2: 25 mmol/L (ref 22–32)
Calcium: 9.5 mg/dL (ref 8.9–10.3)
Chloride: 104 mmol/L (ref 98–111)
Creatinine, Ser: 0.67 mg/dL (ref 0.44–1.00)
GFR, Estimated: >60 mL/min (ref >60)
Glucose, Bld: 128 mg/dL — ABNORMAL HIGH (ref 70–99)
Potassium: 3.3 mmol/L — ABNORMAL LOW (ref 3.5–5.1)
Sodium: 137 mmol/L (ref 135–145)

## 2021-07-07 LAB — CBC WITH DIFFERENTIAL/PLATELET
Abs Immature Granulocytes: 0.02 10*3/uL (ref 0.00–0.07)
Basophils Absolute: 0 10*3/uL (ref 0.0–0.1)
Basophils Relative: 1 %
Eosinophils Absolute: 0.1 10*3/uL (ref 0.0–0.5)
Eosinophils Relative: 1 %
HCT: 39.8 % (ref 36.0–46.0)
Hemoglobin: 12.7 g/dL (ref 12.0–15.0)
Immature Granulocytes: 0 %
Lymphocytes Relative: 19 %
Lymphs Abs: 1.7 10*3/uL (ref 0.7–4.0)
MCH: 28.2 pg (ref 26.0–34.0)
MCHC: 31.9 g/dL (ref 30.0–36.0)
MCV: 88.2 fL (ref 80.0–100.0)
Monocytes Absolute: 0.5 10*3/uL (ref 0.1–1.0)
Monocytes Relative: 5 %
Neutro Abs: 6.5 10*3/uL (ref 1.7–7.7)
Neutrophils Relative %: 74 %
Platelets: 241 10*3/uL (ref 150–400)
RBC: 4.51 MIL/uL (ref 3.87–5.11)
RDW: 12.8 % (ref 11.5–15.5)
WBC: 8.7 10*3/uL (ref 4.0–10.5)
nRBC: 0 % (ref 0.0–0.2)

## 2021-07-07 IMAGING — MR MR ORBITS WO/W CM
4 of 6 series · 30 of 48 positions shown · IV contrast (gadavist)
Comparison: None Available.

CLINICAL DATA: Optic neuritis suspected optic nerve swelling

EXAM:
MRI HEAD AND ORBITS WITHOUT AND WITH CONTRAST
TECHNIQUE: Multiplanar, multiecho pulse sequences of the brain and surrounding
structures were obtained without and with intravenous contrast.
Multiplanar, multiecho pulse sequences of the orbits and surrounding
structures were obtained including fat saturation techniques, before
and after intravenous contrast administration.
CONTRAST:  9mL GADAVIST GADOBUTROL 1 MMOL/ML IV SOLN

[Series 6: T2 · axial · 3.0mm · 0.78mm/px · z∈[-62,+1]mm · 5 of 17 slices shown (1 of 2)]
[im 1/17]
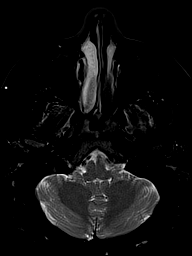
[im 5/17]
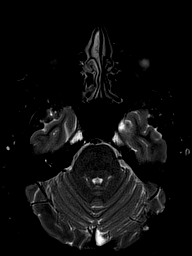
[im 9/17]
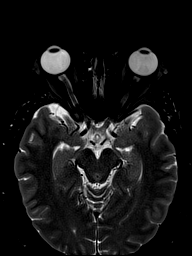
[im 13/17]
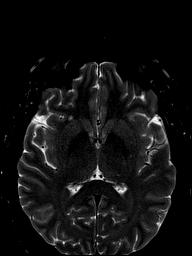
[im 17/17]
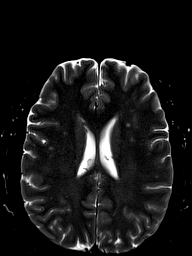

[Series 8: T2 · coronal · 3.0mm · 0.78mm/px · 9 of 35 slices shown (2 of 2)]
[im 1/35]
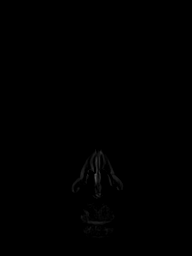
[im 5/35]
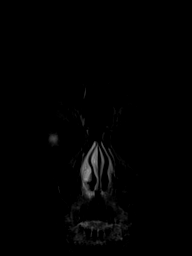
[im 9/35]
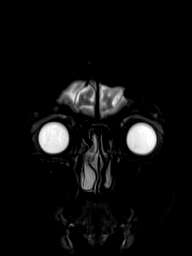
[im 13/35]
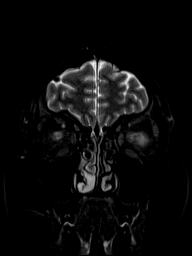
[im 18/35]
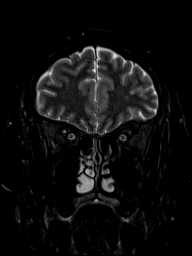
[im 22/35]
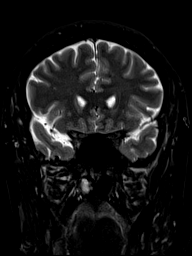
[im 26/35]
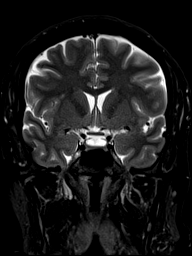
[im 30/35]
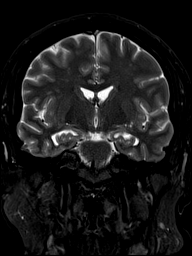
[im 35/35]
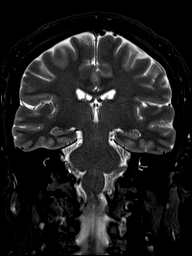

[Series 9: T1 · axial · non-contrast · 3.0mm · 0.31mm/px · z∈[-58,+7]mm · 6 of 23 slices shown (1 of 2)]
[im 1/23]
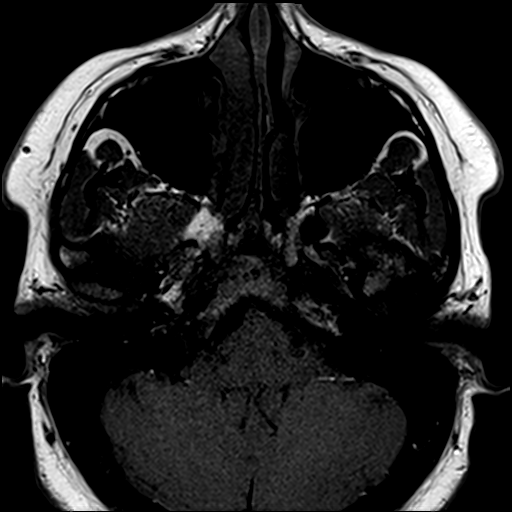
[im 5/23]
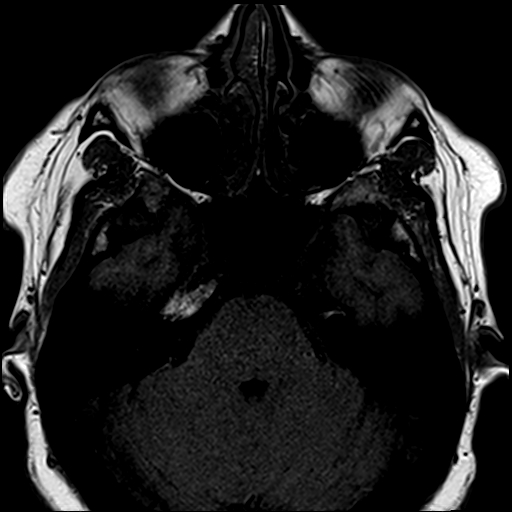
[im 9/23]
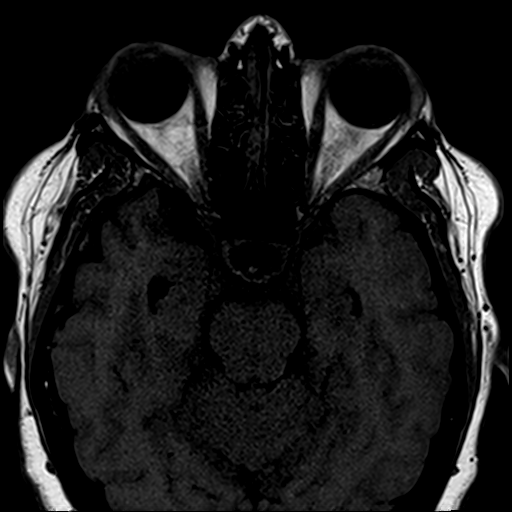
[im 14/23]
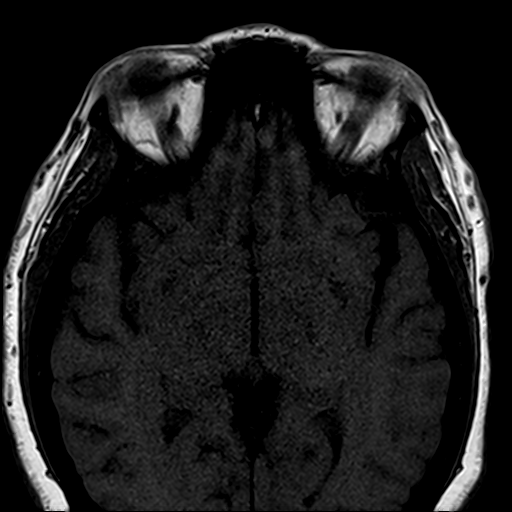
[im 18/23]
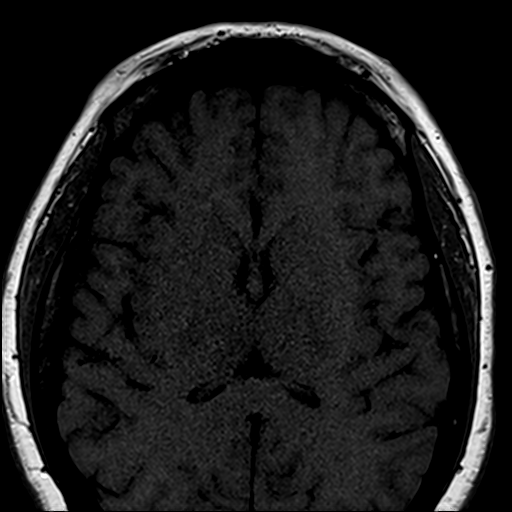
[im 23/23]
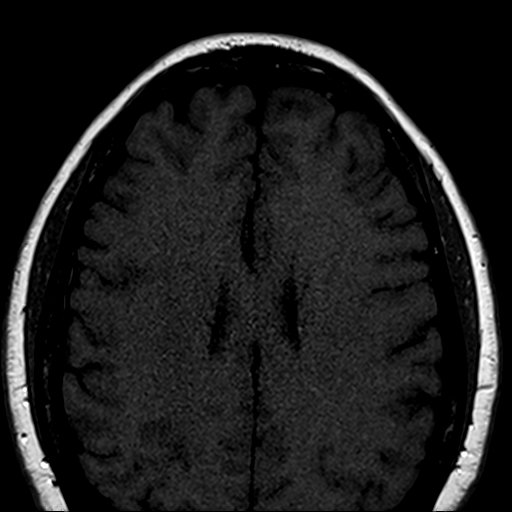

[Series 10: T1 · coronal · non-contrast · 3.0mm · 0.35mm/px · 10 of 40 slices shown (2 of 2)]
[im 1/40]
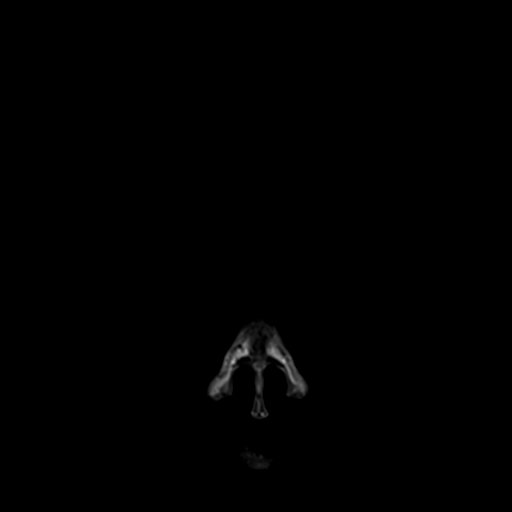
[im 4/40]
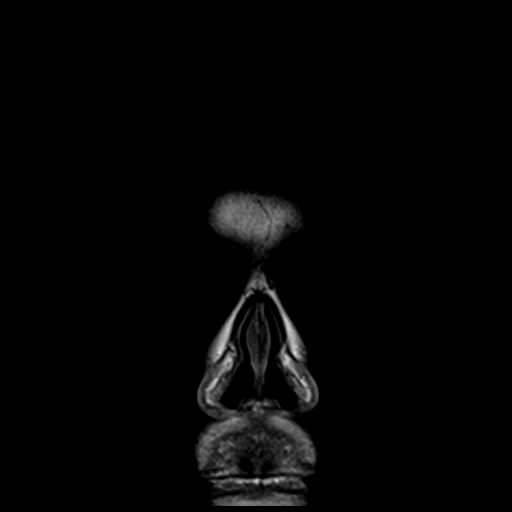
[im 8/40]
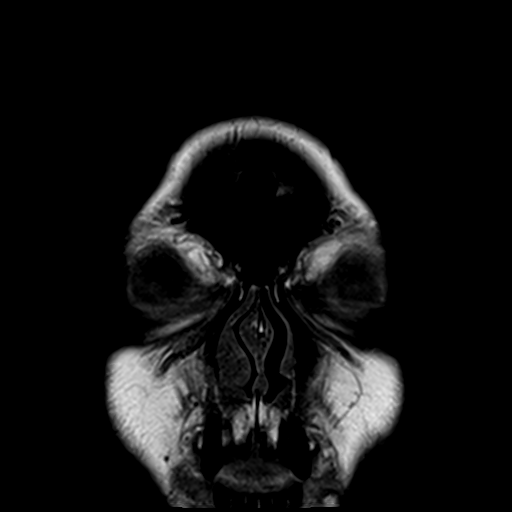
[im 12/40]
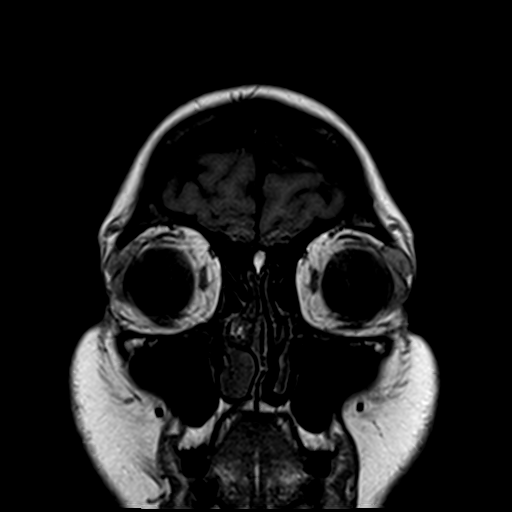
[im 16/40]
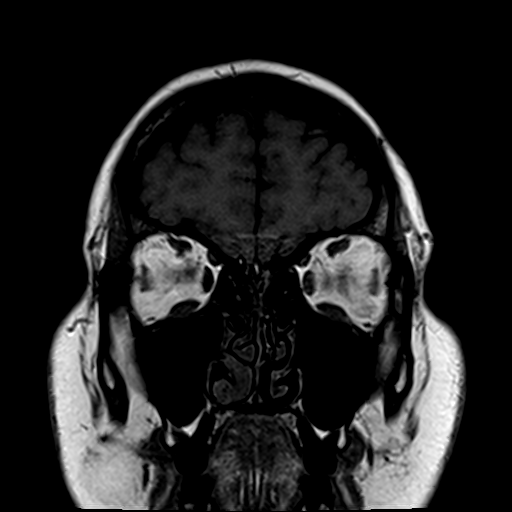
[im 20/40]
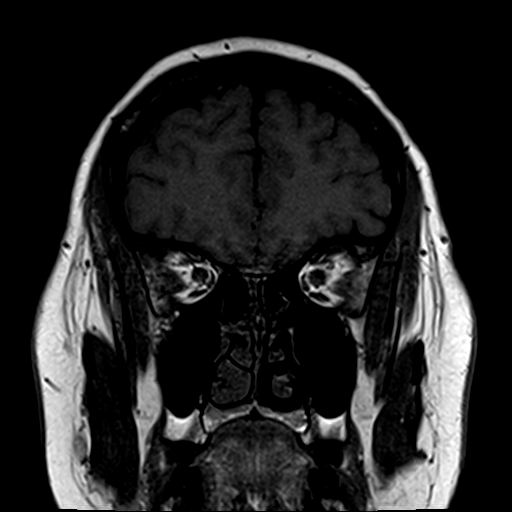
[im 24/40]
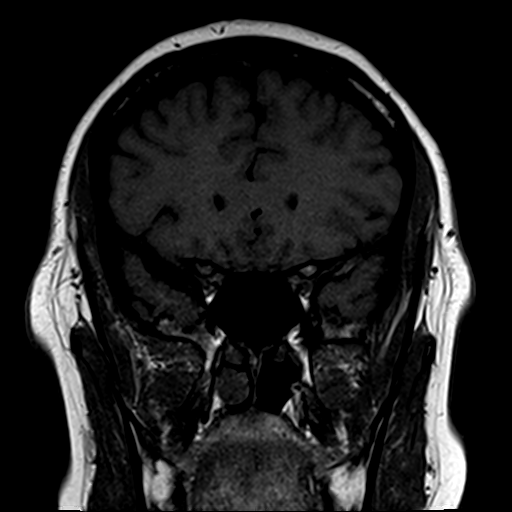
[im 28/40]
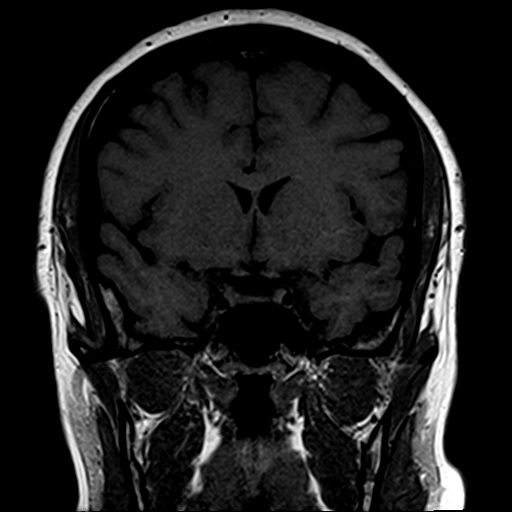
[im 32/40]
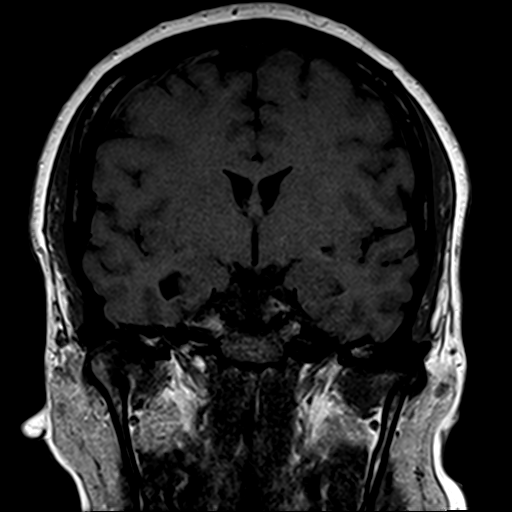
[im 36/40]
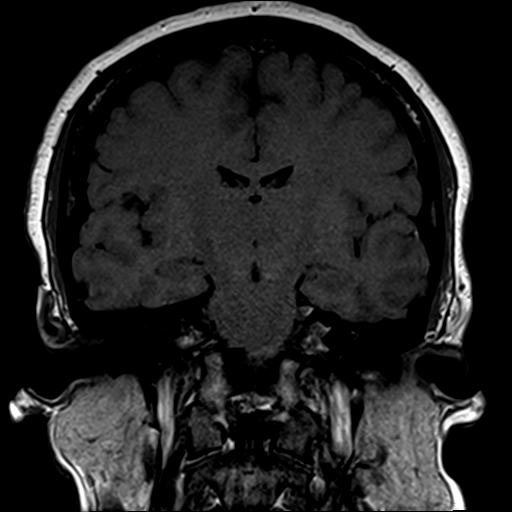

[30 of 48 positions shown; findings below may reference images not displayed]

FINDINGS: MRI HEAD FINDINGS

Brain: No acute infarction or hemorrhage. Ventricles and sulci are
normal in size and configuration. No intracranial mass, mass effect,
edema, hydrocephalus, or extra-axial collection. Patchy small foci
of T2 hyperintensity in the supratentorial white matter likely
reflect nonspecific gliosis/demyelination.

Vascular: Major vessel flow voids at the skull base are preserved.

Skull and upper cervical spine: Marrow signal is within normal
limits.

Other: Partially empty sella.  Mastoid air cells are clear.

MRI ORBITS FINDINGS

Orbits: No proptosis. No intraorbital mass. Globes, lacrimal glands,
and extraocular muscles are symmetric and unremarkable. No abnormal
enhancement of the optic nerve sheath complexes.

Visualized sinuses: Minor mucosal thickening.

Soft tissues: Unremarkable.
IMPRESSION: No evidence of optic neuritis by imaging.

Mild burden of nonspecific gliosis/demyelination in the cerebral
white matter.

Partially empty sella. A nonspecific finding that is typically
incidental but can be seen in the setting idiopathic intracranial
hypertension.

## 2021-07-07 IMAGING — MR MR HEAD WO/W CM
14 series · 48 of 48 positions shown · IV contrast (gadavist)
Comparison: None Available.

CLINICAL DATA: Optic neuritis suspected optic nerve swelling

EXAM:
MRI HEAD AND ORBITS WITHOUT AND WITH CONTRAST
TECHNIQUE: Multiplanar, multiecho pulse sequences of the brain and surrounding
structures were obtained without and with intravenous contrast.
Multiplanar, multiecho pulse sequences of the orbits and surrounding
structures were obtained including fat saturation techniques, before
and after intravenous contrast administration.
CONTRAST:  9mL GADAVIST GADOBUTROL 1 MMOL/ML IV SOLN

[Series 5: ax dwi_tracew · axial · 3.0mm · 0.65mm/px · z∈[-81,+74]mm · 4 of 48 slices shown]
[im 1/48]
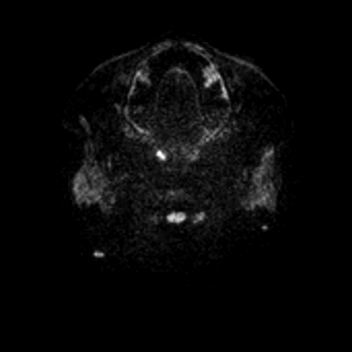
[im 16/48]
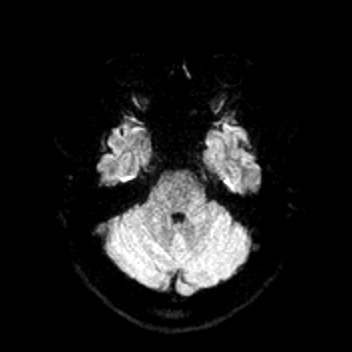
[im 32/48]
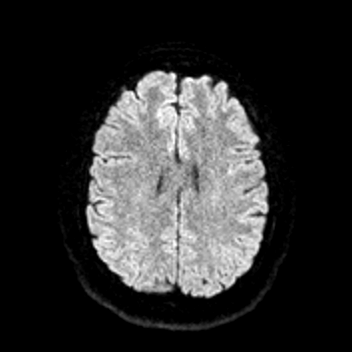
[im 48/48]
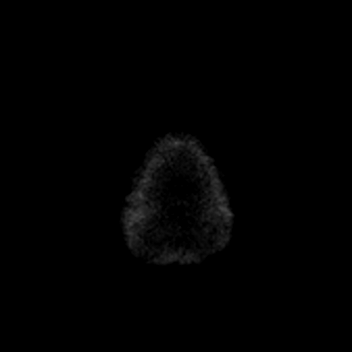

[Series 6: ax dwi_adc · axial · 3.0mm · 0.65mm/px · z∈[-81,+74]mm · 3 of 48 slices shown]
[im 1/48]
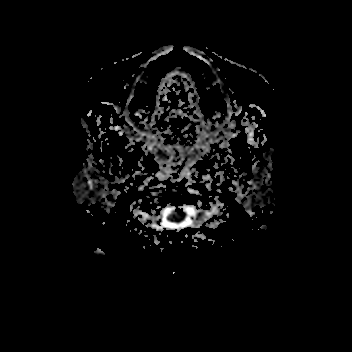
[im 24/48]
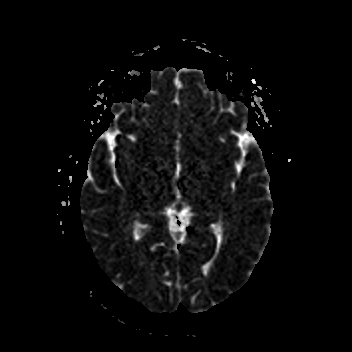
[im 48/48]
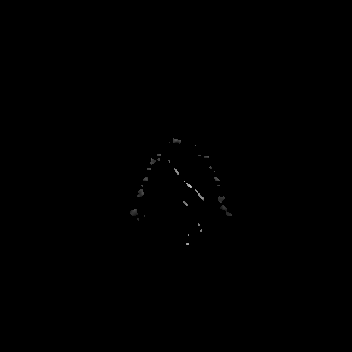

[Series 7: cor dwi_tracew · coronal · 5.0mm · 0.60mm/px · 2 of 38 slices shown]
[im 1/38]
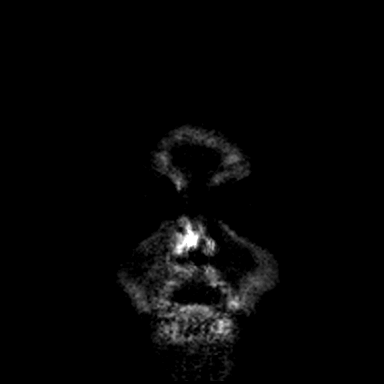
[im 38/38]
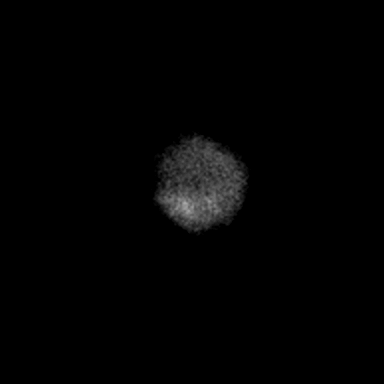

[Series 8: cor dwi_adc · coronal · 5.0mm · 0.60mm/px · 2 of 38 slices shown]
[im 1/38]
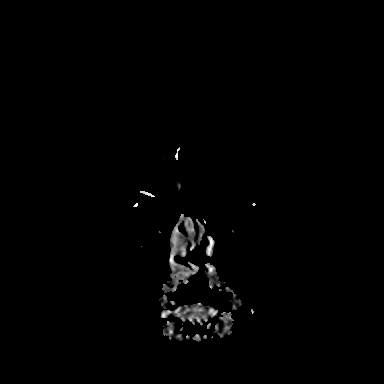
[im 38/38]
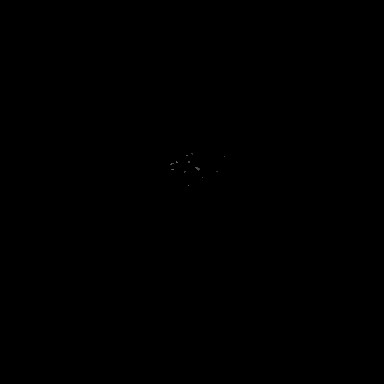

[Series 9: T1 · sagittal · 5.0mm · 0.62mm/px · 1 of 23 slices shown (1 of 2)]
[im 1/23]
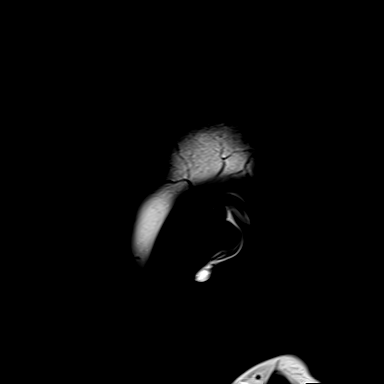

[Series 10: T2 · axial · 5.0mm · 0.53mm/px · 1 of 25 slices shown (1 of 2)]
[im 1/25]
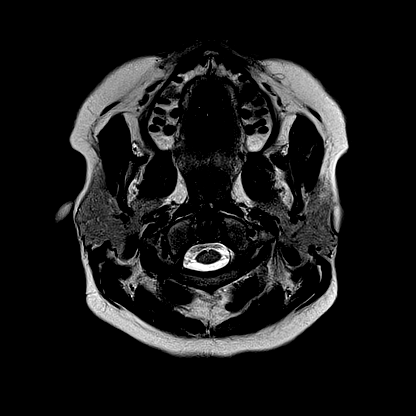

[Series 11: ax swi_mag · axial · 2.0mm · 0.90mm/px · z∈[-82,+75]mm · 4 of 80 slices shown]
[im 1/80]
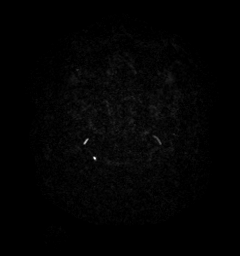
[im 27/80]
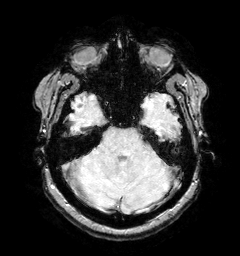
[im 53/80]
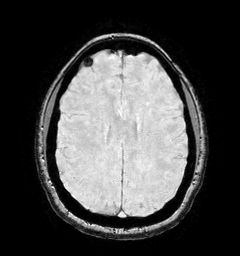
[im 80/80]
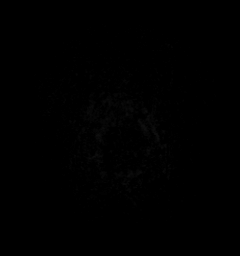

[Series 12: ax swi_pha · axial · 2.0mm · 0.90mm/px · z∈[-82,+75]mm · 4 of 80 slices shown]
[im 1/80]
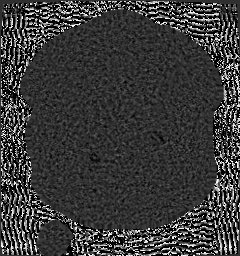
[im 27/80]
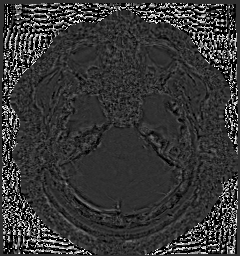
[im 53/80]
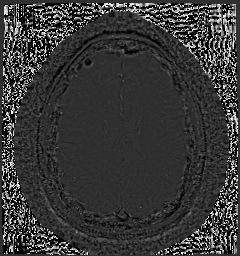
[im 80/80]
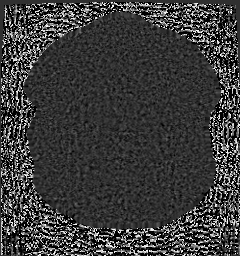

[Series 13: ax swi_swi · axial · 2.0mm · 0.90mm/px · z∈[-82,+75]mm · 4 of 80 slices shown]
[im 1/80]
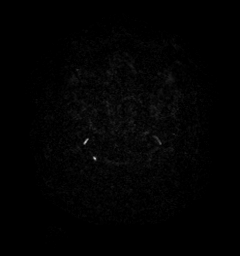
[im 27/80]
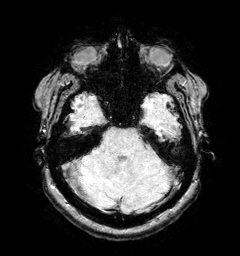
[im 53/80]
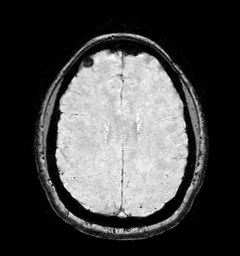
[im 80/80]
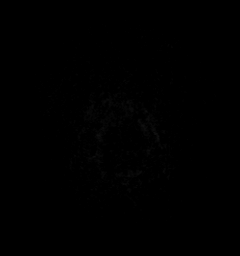

[Series 15: FLAIR · axial · 3.0mm · 0.53mm/px · z∈[-84,+78]mm · 3 of 55 slices shown]
[im 1/55]
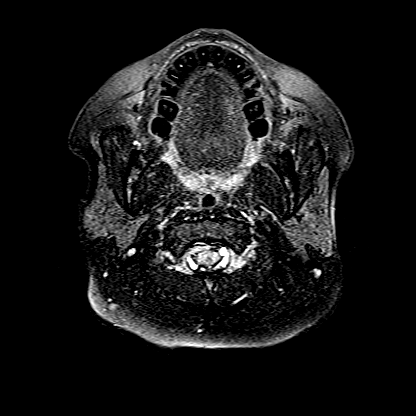
[im 28/55]
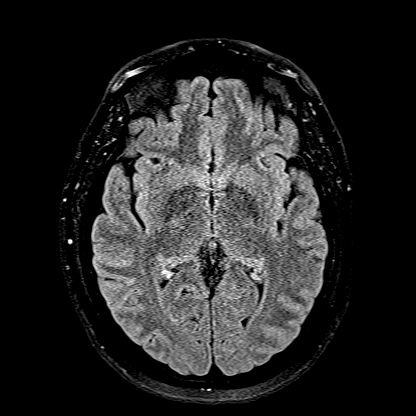
[im 55/55]
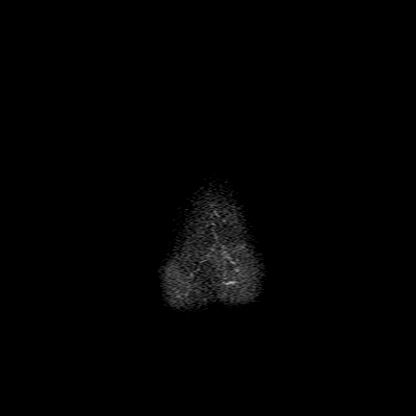

[Series 16: T1 · axial · 1.0mm · 0.98mm/px · z∈[-74,+68]mm · 8 of 144 slices shown (2 of 2)]
[im 1/144]
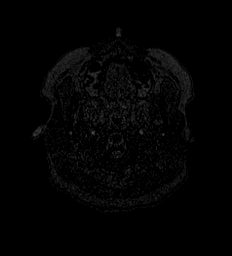
[im 21/144]
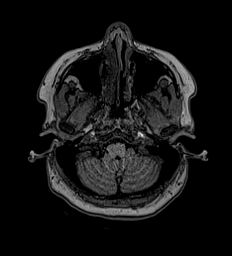
[im 41/144]
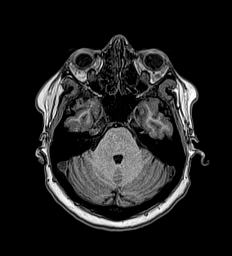
[im 62/144]
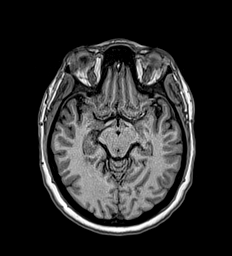
[im 82/144]
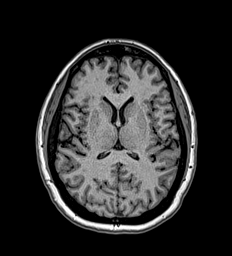
[im 103/144]
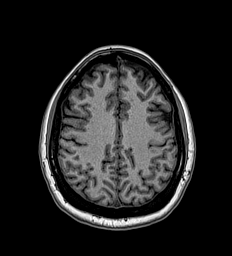
[im 123/144]
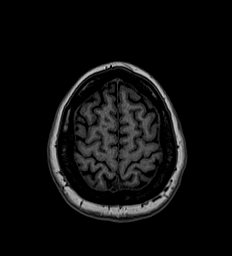
[im 144/144]
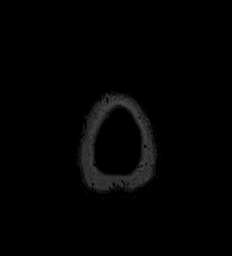

[Series 17: T2 · coronal · 5.0mm · 0.57mm/px · 2 of 29 slices shown (2 of 2)]
[im 1/29]
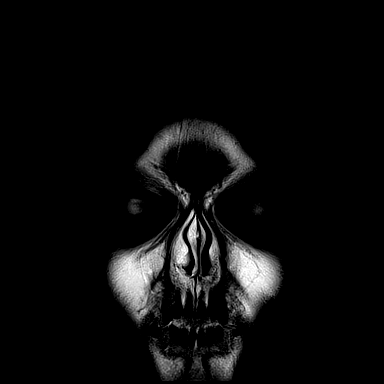
[im 29/29]
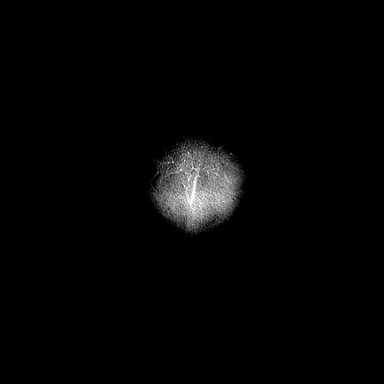

[Series 18: T1 post-contrast · axial · 1.0mm · 0.98mm/px · z∈[-74,+68]mm · 8 of 144 slices shown (1 of 2)]
[im 1/144]
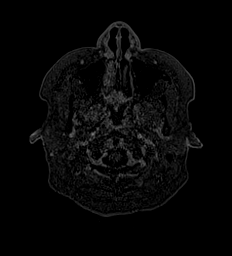
[im 21/144]
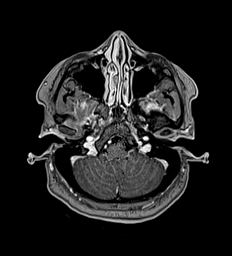
[im 41/144]
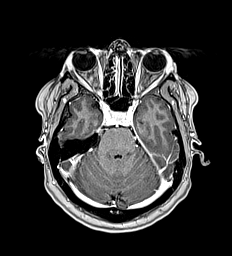
[im 62/144]
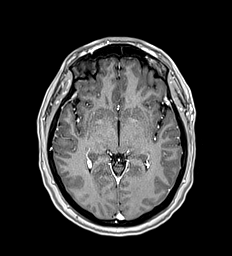
[im 82/144]
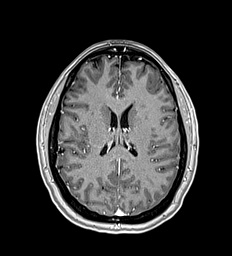
[im 103/144]
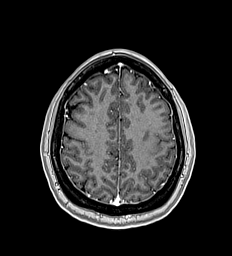
[im 123/144]
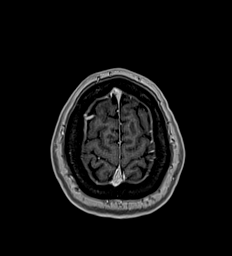
[im 144/144]
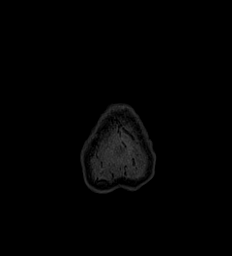

[Series 19: T1 post-contrast · coronal · 5.0mm · 0.57mm/px · 2 of 29 slices shown (2 of 2)]
[im 1/29]
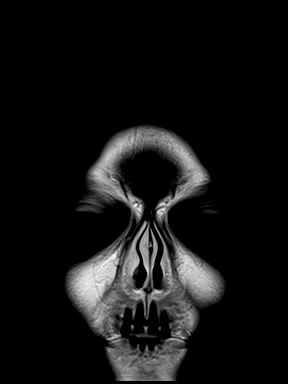
[im 29/29]
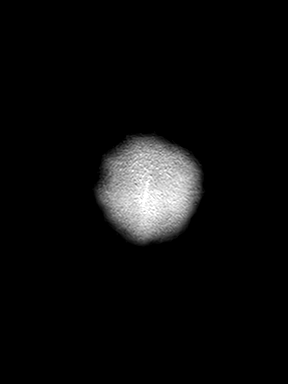

[48 of 48 positions shown; findings below may reference images not displayed]

FINDINGS: MRI HEAD FINDINGS

Brain: No acute infarction or hemorrhage. Ventricles and sulci are
normal in size and configuration. No intracranial mass, mass effect,
edema, hydrocephalus, or extra-axial collection. Patchy small foci
of T2 hyperintensity in the supratentorial white matter likely
reflect nonspecific gliosis/demyelination.

Vascular: Major vessel flow voids at the skull base are preserved.

Skull and upper cervical spine: Marrow signal is within normal
limits.

Other: Partially empty sella.  Mastoid air cells are clear.

MRI ORBITS FINDINGS

Orbits: No proptosis. No intraorbital mass. Globes, lacrimal glands,
and extraocular muscles are symmetric and unremarkable. No abnormal
enhancement of the optic nerve sheath complexes.

Visualized sinuses: Minor mucosal thickening.

Soft tissues: Unremarkable.
IMPRESSION: No evidence of optic neuritis by imaging.

Mild burden of nonspecific gliosis/demyelination in the cerebral
white matter.

Partially empty sella. A nonspecific finding that is typically
incidental but can be seen in the setting idiopathic intracranial
hypertension.

## 2021-07-07 MED ORDER — GADOBUTROL 1 MMOL/ML IV SOLN
9.0000 mL | Freq: Once | INTRAVENOUS | Status: AC | PRN
  Administered 2021-07-07: 9 mL via INTRAVENOUS

## 2021-07-07 NOTE — Telephone Encounter (Signed)
Can you call and see what she is talking about, maybe request the office notes too

## 2021-07-07 NOTE — ED Notes (Signed)
Pt taken to MRI  

## 2021-07-07 NOTE — ED Triage Notes (Signed)
Patient reports being seen at eye doctor for vision changes. Reports hard to see far away and up close that has gradually happened. Seen at [facility] lab today and sent to ER for "possible swollen nerve OD." Reports to patient they want her to have MRI and lumbar puncture.

## 2021-07-07 NOTE — Discharge Instructions (Signed)
Please return for any new or worsening headaches, pain, weakness, or for any additional questions or concerns.

## 2021-07-07 NOTE — ED Provider Notes (Signed)
[facility] Provider Note    Event Date/Time   First MD Initiated Contact with Patient 07/07/21 1612     (approximate)   History   Eye Problem   HPI  [name] is a 46 y.o. female no significant past medical history presents to the ER for several days of change in vision she feels it is primarily out of her right eye.  States that she has some noticed blurriness with near and far site.  She went to [facility] had scans done 2 days ago and then was called today saying that her results showed that she had optic disc swelling of the right eye.  She denies any other associated numbness or tingling no headaches.  No fevers.     Physical Exam   Triage Vital Signs: ED Triage Vitals  Enc Vitals Group     BP 07/07/21 1402 (!) 163/101     Pulse Rate 07/07/21 1402 88     Resp 07/07/21 1402 16     Temp 07/07/21 1402 98.8 F (37.1 C)     Temp Source 07/07/21 1402 Oral     SpO2 07/07/21 1402 95 %     Weight 07/07/21 1400 209 lb (94.8 kg)     Height 07/07/21 1400 5\' 4"  (1.626 m)     Head Circumference --      Peak Flow --      Pain Score 07/07/21 1400 0     Pain Loc --      Pain Edu? --      Excl. in GC? --     Most recent vital signs: Vitals:   07/07/21 1800 07/07/21 1958  BP: 136/84 (!) 129/96  Pulse: 81 77  Resp: 18 17  Temp:    SpO2: 100% 100%     Constitutional: Alert  Eyes: Conjunctivae are normal. perrla  Head: Atraumatic. Nose: No congestion/rhinnorhea. Mouth/Throat: Mucous membranes are moist.   Neck: Painless ROM.  Cardiovascular:   Good peripheral circulation. Respiratory: Normal respiratory effort.  No retractions.  Gastrointestinal: Soft and nontender.  Musculoskeletal:  no deformity Neurologic:  MAE spontaneously. No gross focal neurologic deficits are appreciated.  Skin:  Skin is warm, dry and intact. No rash noted. Psychiatric: Mood and affect are normal. Speech and behavior are normal.    ED Results /  Procedures / Treatments   Labs (all labs ordered are listed, but only abnormal results are displayed) Labs Reviewed  BASIC METABOLIC PANEL - Abnormal; Notable for the following components:      Result Value   Potassium 3.3 (*)    Glucose, Bld 128 (*)    All other components within normal limits  CBC WITH DIFFERENTIAL/PLATELET     EKG     RADIOLOGY Please see ED Course for my review and interpretation.  I personally reviewed all radiographic images ordered to evaluate for the above acute complaints and reviewed radiology reports and findings.  These findings were personally discussed with the patient.  Please see medical record for radiology report.    PROCEDURES:  Critical Care performed: No  Procedures   MEDICATIONS ORDERED IN ED: Medications  gadobutrol (GADAVIST) 1 MMOL/ML injection 9 mL (9 mLs Intravenous Contrast Given 07/07/21 1933)     IMPRESSION / MDM / ASSESSMENT AND PLAN / ED COURSE  I reviewed the triage vital signs and the nursing notes.  Differential diagnosis includes, but is not limited to, optic neuritis, ms, cva, pseudotumor  Patient presented to the ER for evaluation of symptoms as described above.  This presenting complaint could reflect a potentially life-threatening illness as listed in the differential above therefore laboratory evaluation and MRI will be ordered to evaluate for the above complaints.     Clinical Course as of 07/07/21 2034  Fri Jul 07, 2021  1957 MRI my interpretation does not show evidence of large mass will await formal radiology report. [PR]  2030 MRI shows no evidence of optic neuritis mild burden of nonspecifically gnosis of demyelination in the white matter.  Partially empty sella possible MDO pathic intracranial hypertension.  Patient has already been in contact with Dr. Sherryll Burger of neurology who discussed results of MRI with and agrees to work patient into clinic urgently for further evaluation  and management.  She remains well-appearing in no acute distress and does appear stable and appropriate for our further work-up as an outpatient. [PR]    Clinical Course User Index [PR] Willy Eddy, MD     FINAL CLINICAL IMPRESSION(S) / ED DIAGNOSES   Final diagnoses:  Blurred vision     Rx / DC Orders   ED Discharge Orders     None        Note:  This document was prepared using Dragon voice recognition software and may include unintentional dictation errors.    Willy Eddy, MD 07/07/21 2034

## 2021-07-07 NOTE — Telephone Encounter (Signed)
Copied from CRM [id number]. Topic: General - Other >> Jul 07, 2021 12:25 PM Traci Sermon wrote: Reason for CRM: Pt called in stating that [facility] in [facility] told her that she needed an MRI referral done quickly to [facility], pt is requesting a call back, so she can explain a little more, please advise.

## 2021-07-10 NOTE — Telephone Encounter (Signed)
Called [facility] they are going to fax the office notes to [place].   Thanks,   -Vernona Rieger

## 2021-07-11 ENCOUNTER — Other Ambulatory Visit: Payer: Self-pay | Admitting: Neurology

## 2021-07-11 ENCOUNTER — Ambulatory Visit: Payer: Medicaid Other | Admitting: Internal Medicine

## 2021-07-11 DIAGNOSIS — G43119 Migraine with aura, intractable, without status migrainosus: Secondary | ICD-10-CM | POA: Diagnosis not present

## 2021-07-11 DIAGNOSIS — Z114 Encounter for screening for human immunodeficiency virus [HIV]: Secondary | ICD-10-CM | POA: Diagnosis not present

## 2021-07-11 DIAGNOSIS — H539 Unspecified visual disturbance: Secondary | ICD-10-CM

## 2021-07-11 DIAGNOSIS — E559 Vitamin D deficiency, unspecified: Secondary | ICD-10-CM | POA: Diagnosis not present

## 2021-07-11 DIAGNOSIS — G43519 Persistent migraine aura without cerebral infarction, intractable, without status migrainosus: Secondary | ICD-10-CM | POA: Diagnosis not present

## 2021-07-11 DIAGNOSIS — G4719 Other hypersomnia: Secondary | ICD-10-CM | POA: Diagnosis not present

## 2021-07-11 DIAGNOSIS — R0683 Snoring: Secondary | ICD-10-CM | POA: Diagnosis not present

## 2021-07-11 DIAGNOSIS — G939 Disorder of brain, unspecified: Secondary | ICD-10-CM | POA: Diagnosis not present

## 2021-07-11 NOTE — Telephone Encounter (Signed)
Can we follow up on this, still have not seen any office notes.

## 2021-07-12 ENCOUNTER — Ambulatory Visit: Payer: Medicaid Other | Admitting: Internal Medicine

## 2021-07-12 ENCOUNTER — Encounter: Payer: Self-pay | Admitting: Internal Medicine

## 2021-07-12 VITALS — BP 122/78 | HR 81 | Temp 97.5°F | Wt 205.0 lb

## 2021-07-12 DIAGNOSIS — H4713 Papilledema associated with retinal disorder: Secondary | ICD-10-CM

## 2021-07-12 DIAGNOSIS — H538 Other visual disturbances: Secondary | ICD-10-CM

## 2021-07-12 DIAGNOSIS — G932 Benign intracranial hypertension: Secondary | ICD-10-CM

## 2021-07-12 DIAGNOSIS — Z6835 Body mass index (BMI) 35.0-35.9, adult: Secondary | ICD-10-CM | POA: Diagnosis not present

## 2021-07-12 DIAGNOSIS — R519 Headache, unspecified: Secondary | ICD-10-CM

## 2021-07-12 HISTORY — DX: Benign intracranial hypertension: G93.2

## 2021-07-12 MED ORDER — PHENTERMINE HCL 15 MG PO CAPS: 15.0000 mg | ORAL_CAPSULE | ORAL | 1 refills | Status: DC

## 2021-07-12 NOTE — Patient Instructions (Signed)
Calorie Counting for Weight Loss Calories are units of energy. Your body needs a certain number of calories from food to keep going throughout the day. When you eat or drink more calories than your body needs, your body stores the extra calories mostly as fat. When you eat or drink fewer calories than your body needs, your body burns fat to get the energy it needs. Calorie counting means keeping track of how many calories you eat and drink each day. Calorie counting can be helpful if you need to lose weight. If you eat fewer calories than your body needs, you should lose weight. Ask your health care provider what a healthy weight is for you. For calorie counting to work, you will need to eat the right number of calories each day to lose a healthy amount of weight per week. A dietitian can help you figure out how many calories you need in a day and will suggest ways to reach your calorie goal. A healthy amount of weight to lose each week is usually 1-2 lb (0.5-0.9 kg). This usually means that your daily calorie intake should be reduced by 500-750 calories. Eating 1,200-1,500 calories a day can help most women lose weight. Eating 1,500-1,800 calories a day can help most men lose weight. What do I need to know about calorie counting? Work with your health care provider or dietitian to determine how many calories you should get each day. To meet your daily calorie goal, you will need to: Find out how many calories are in each food that you would like to eat. Try to do this before you eat. Decide how much of the food you plan to eat. Keep a food log. Do this by writing down what you ate and how many calories it had. To successfully lose weight, it is important to balance calorie counting with a healthy lifestyle that includes regular activity. Where do I find calorie information?  The number of calories in a food can be found on a Nutrition Facts label. If a food does not have a Nutrition Facts label, try  to look up the calories online or ask your dietitian for help. Remember that calories are listed per serving. If you choose to have more than one serving of a food, you will have to multiply the calories per serving by the number of servings you plan to eat. For example, the label on a package of bread might say that a serving size is 1 slice and that there are 90 calories in a serving. If you eat 1 slice, you will have eaten 90 calories. If you eat 2 slices, you will have eaten 180 calories. How do I keep a food log? After each time that you eat, record the following in your food log as soon as possible: What you ate. Be sure to include toppings, sauces, and other extras on the food. How much you ate. This can be measured in cups, ounces, or number of items. How many calories were in each food and drink. The total number of calories in the food you ate. Keep your food log near you, such as in a pocket-sized notebook or on an app or website on your mobile phone. Some programs will calculate calories for you and show you how many calories you have left to meet your daily goal. What are some portion-control tips? Know how many calories are in a serving. This will help you know how many servings you can have of a certain   food. Use a measuring cup to measure serving sizes. You could also try weighing out portions on a kitchen scale. With time, you will be able to estimate serving sizes for some foods. Take time to put servings of different foods on your favorite plates or in your favorite bowls and cups so you know what a serving looks like. Try not to eat straight from a food's packaging, such as from a bag or box. Eating straight from the package makes it hard to see how much you are eating and can lead to overeating. Put the amount you would like to eat in a cup or on a plate to make sure you are eating the right portion. Use smaller plates, glasses, and bowls for smaller portions and to prevent  overeating. Try not to multitask. For example, avoid watching TV or using your computer while eating. If it is time to eat, sit down at a table and enjoy your food. This will help you recognize when you are full. It will also help you be more mindful of what and how much you are eating. What are tips for following this plan? Reading food labels Check the calorie count compared with the serving size. The serving size may be smaller than what you are used to eating. Check the source of the calories. Try to choose foods that are high in protein, fiber, and vitamins, and low in saturated fat, trans fat, and sodium. Shopping Read nutrition labels while you shop. This will help you make healthy decisions about which foods to buy. Pay attention to nutrition labels for low-fat or fat-free foods. These foods sometimes have the same number of calories or more calories than the full-fat versions. They also often have added sugar, starch, or salt to make up for flavor that was removed with the fat. Make a grocery list of lower-calorie foods and stick to it. Cooking Try to cook your favorite foods in a healthier way. For example, try baking instead of frying. Use low-fat dairy products. Meal planning Use more fruits and vegetables. One-half of your plate should be fruits and vegetables. Include lean proteins, such as chicken, turkey, and fish. Lifestyle Each week, aim to do one of the following: 150 minutes of moderate exercise, such as walking. 75 minutes of vigorous exercise, such as running. General information Know how many calories are in the foods you eat most often. This will help you calculate calorie counts faster. Find a way of tracking calories that works for you. Get creative. Try different apps or programs if writing down calories does not work for you. What foods should I eat?  Eat nutritious foods. It is better to have a nutritious, high-calorie food, such as an avocado, than a food with  few nutrients, such as a bag of potato chips. Use your calories on foods and drinks that will fill you up and will not leave you hungry soon after eating. Examples of foods that fill you up are nuts and nut butters, vegetables, lean proteins, and high-fiber foods such as whole grains. High-fiber foods are foods with more than 5 g of fiber per serving. Pay attention to calories in drinks. Low-calorie drinks include water and unsweetened drinks. The items listed above may not be a complete list of foods and beverages you can eat. Contact a dietitian for more information. What foods should I limit? Limit foods or drinks that are not good sources of vitamins, minerals, or protein or that are high in unhealthy fats. These   include: Candy. Other sweets. Sodas, specialty coffee drinks, alcohol, and juice. The items listed above may not be a complete list of foods and beverages you should avoid. Contact a dietitian for more information. How do I count calories when eating out? Pay attention to portions. Often, portions are much larger when eating out. Try these tips to keep portions smaller: Consider sharing a meal instead of getting your own. If you get your own meal, eat only half of it. Before you start eating, ask for a container and put half of your meal into it. When available, consider ordering smaller portions from the menu instead of full portions. Pay attention to your food and drink choices. Knowing the way food is cooked and what is included with the meal can help you eat fewer calories. If calories are listed on the menu, choose the lower-calorie options. Choose dishes that include vegetables, fruits, whole grains, low-fat dairy products, and lean proteins. Choose items that are boiled, broiled, grilled, or steamed. Avoid items that are buttered, battered, fried, or served with cream sauce. Items labeled as crispy are usually fried, unless stated otherwise. Choose water, low-fat milk,  unsweetened iced tea, or other drinks without added sugar. If you want an alcoholic beverage, choose a lower-calorie option, such as a glass of wine or light beer. Ask for dressings, sauces, and syrups on the side. These are usually high in calories, so you should limit the amount you eat. If you want a salad, choose a garden salad and ask for grilled meats. Avoid extra toppings such as bacon, cheese, or fried items. Ask for the dressing on the side, or ask for olive oil and vinegar or lemon to use as dressing. Estimate how many servings of a food you are given. Knowing serving sizes will help you be aware of how much food you are eating at restaurants. Where to find more information Centers for Disease Control and Prevention: www.cdc.gov U.[place]. Department of Agriculture: myplate.gov Summary Calorie counting means keeping track of how many calories you eat and drink each day. If you eat fewer calories than your body needs, you should lose weight. A healthy amount of weight to lose per week is usually 1-2 lb (0.5-0.9 kg). This usually means reducing your daily calorie intake by 500-750 calories. The number of calories in a food can be found on a Nutrition Facts label. If a food does not have a Nutrition Facts label, try to look up the calories online or ask your dietitian for help. Use smaller plates, glasses, and bowls for smaller portions and to prevent overeating. Use your calories on foods and drinks that will fill you up and not leave you hungry shortly after a meal. This information is not intended to replace advice given to you by your health care provider. Make sure you discuss any questions you have with your health care provider. Document Revised: 03/19/2019 Document Reviewed: 03/19/2019 Elsevier Patient Education  2023 Elsevier Inc.  

## 2021-07-12 NOTE — Progress Notes (Signed)
Subjective:    Patient ID: [name], female    DOB: 13-Feb-1976, 46 y.o.   MRN: [id number]  HPI  Patient presents to clinic today for ER follow-up.  She presented to the ER 5/19 with complaint of blurred vision in her right eye.  [facility] prior, had scans done that showed that she had optic disc swelling of the right eye.  She denied headaches, dizziness.  Labs were remarkable for slightly low potassium of 3.3, otherwise unremarkable.  MRI showed:  IMPRESSION: No evidence of optic neuritis by imaging. Mild burden of nonspecific gliosis/demyelination in the cerebral white matter. Partially empty sella. A nonspecific finding that is typically incidental but can be seen in the setting idiopathic intracranial hypertension.  She was discharged same day and advised to follow-up with neurology as an outpatient.  She saw Dr. Clelia CroftShaw yesterday.  A lumbar puncture was ordered to rule out idiopathic intracranial hypertension.  She was started on Acetazolamide.  A sleep study was also ordered. She does need a referral to ophthalmology for further evaluation as well.  She is also due to follow up on her weight. At her last visit, she was started on Phentermine. Her starting weight was 209 lbs with a BMI of 35.86. Her weight today is 205 lbs with a BMI of 35.19.  Review of Systems     Past Medical History:  Diagnosis Date   Depression    Diverticulosis 2018   Frequent headaches    Heart murmur    History of chicken pox     Current Outpatient Medications  Medication Sig Dispense Refill   acetaZOLAMIDE (DIAMOX) 250 MG tablet Take by mouth.     cetirizine (ZYRTEC) 10 MG tablet Take by mouth daily.     fluticasone (FLONASE) 50 MCG/ACT nasal spray Place into both nostrils daily.     PARAGARD INTRAUTERINE COPPER IU by Intrauterine route.     phentermine 15 MG capsule Take 1 capsule (15 mg total) by mouth every morning. 30 capsule 0   valACYclovir (VALTREX) 1000 MG  tablet Take 1 tablet (1,000 mg total) by mouth daily. Take for 5 days 5 tablet 3   No current facility-administered medications for this visit.    No Known Allergies  Family History  Problem Relation Age of Onset   Heart disease Mother        Unsure    Social History   Socioeconomic History   Marital status: Divorced    Spouse name: Not on file   Number of children: Not on file   Years of education: Not on file   Highest education level: Not on file  Occupational History   Not on file  Tobacco Use   Smoking status: Never   Smokeless tobacco: Never  Vaping Use   Vaping Use: Never used  Substance and Sexual Activity   Alcohol use: Not Currently   Drug use: Never   Sexual activity: Yes    Birth control/protection: None  Other Topics Concern   Not on file  Social History Narrative   Not on file   Social Determinants of Health   Financial Resource Strain: Not on file  Food Insecurity: Not on file  Transportation Needs: Not on file  Physical Activity: Not on file  Stress: Not on file  Social Connections: Not on file  Intimate Partner Violence: Not on file     Constitutional: Pt reports intermittent headaches. Denies fever, malaise, fatigue, or abrupt  weight changes.  HEENT: Pt reports blurred vision. Denies eye pain, eye redness, ear pain, ringing in the ears, wax buildup, runny nose, nasal congestion, bloody nose, or sore throat. Respiratory: Denies difficulty breathing, shortness of breath, cough or sputum production.   Cardiovascular: Denies chest pain, chest tightness, palpitations or swelling in the hands or feet.  Neurological: Denies dizziness, difficulty with memory, difficulty with speech or problems with balance and coordination.    No other specific complaints in a complete review of systems (except as listed in HPI above).  Objective:   Physical Exam   BP 122/78 (BP Location: Left Arm, Patient Position: Sitting, Cuff Size: Normal)   Pulse 81    Temp (!) 97.5 F (36.4 C) (Temporal)   Wt 205 lb (93 kg)   SpO2 100%   BMI 35.19 kg/m  Wt Readings from Last 3 Encounters:  07/12/21 205 lb (93 kg)  07/07/21 209 lb (94.8 kg)  06/13/21 208 lb 12.8 oz (94.7 kg)    General: Appears her stated age, obese, in NAD. HEENT: Head: normal shape and size; Eyes: PERRLA and EOMs intact;  Cardiovascular: Normal rate and rhythm. S1,S2 noted.  No murmur, rubs or gallops noted.  Pulmonary/Chest: Normal effort and positive vesicular breath sounds. No respiratory distress. No wheezes, rales or ronchi noted.  Musculoskeletal: No difficulty with gait.  Neurological: Alert and oriented. Cranial nerves II-XII grossly intact. Coordination normal.    BMET    Component Value Date/Time   NA 137 07/07/2021 1416   NA 138 02/26/2019 1052   NA 140 06/04/2014 1335   K 3.3 (L) 07/07/2021 1416   K 4.2 06/04/2014 1335   CL 104 07/07/2021 1416   CL 107 06/04/2014 1335   CO2 25 07/07/2021 1416   CO2 27 06/04/2014 1335   GLUCOSE 128 (H) 07/07/2021 1416   GLUCOSE 106 (H) 06/04/2014 1335   BUN 11 07/07/2021 1416   BUN 6 02/26/2019 1052   BUN 9 06/04/2014 1335   CREATININE 0.67 07/07/2021 1416   CREATININE 0.73 06/13/2021 0944   CALCIUM 9.5 07/07/2021 1416   CALCIUM 9.4 06/04/2014 1335   GFRNONAA >60 07/07/2021 1416   GFRNONAA >60 06/04/2014 1335   GFRAA >60 04/25/2019 0207   GFRAA >60 06/04/2014 1335    Lipid Panel     Component Value Date/Time   CHOL 178 06/13/2021 0944   TRIG 89 06/13/2021 0944   HDL 49 (L) 06/13/2021 0944   CHOLHDL 3.6 06/13/2021 0944   VLDL 12.8 04/26/2020 1116   LDLCALC 111 (H) 06/13/2021 0944    CBC    Component Value Date/Time   WBC 8.7 07/07/2021 1416   RBC 4.51 07/07/2021 1416   HGB 12.7 07/07/2021 1416   HGB 13.3 02/05/2019 0840   HCT 39.8 07/07/2021 1416   HCT 38.7 02/05/2019 0840   PLT 241 07/07/2021 1416   PLT 211 02/05/2019 0840   MCV 88.2 07/07/2021 1416   MCV 95 02/05/2019 0840   MCV 91 06/04/2014 1335    MCH 28.2 07/07/2021 1416   MCHC 31.9 07/07/2021 1416   RDW 12.8 07/07/2021 1416   RDW 11.8 02/05/2019 0840   RDW 12.6 06/04/2014 1335   LYMPHSABS 1.7 07/07/2021 1416   LYMPHSABS 1.3 10/09/2018 1048   LYMPHSABS 2.0 06/04/2014 1335   MONOABS 0.5 07/07/2021 1416   MONOABS 0.5 06/04/2014 1335   EOSABS 0.1 07/07/2021 1416   EOSABS 0.0 10/09/2018 1048   EOSABS 0.1 06/04/2014 1335   BASOSABS 0.0 07/07/2021 1416  BASOSABS 0.0 10/09/2018 1048   BASOSABS 0.1 06/04/2014 1335    Hgb A1C Lab Results  Component Value Date   HGBA1C 5.2 04/26/2020           Assessment & Plan:   ER Follow Up for Headaches, Blurred Vision, IIH, Retinal Papilledema:  ER notes, labs and imaging reviewed Neurology notes reviewed Referral to ophthalmology placed She will continue Diamox for now  RTC in 5 months for followup chronic conditions  Nicki Reaper, NP

## 2021-07-12 NOTE — Assessment & Plan Note (Signed)
Will continue Phentermine at current dose Encouraged diet and exercise for weight loss

## 2021-07-13 ENCOUNTER — Telehealth: Payer: Self-pay

## 2021-07-13 DIAGNOSIS — H4713 Papilledema associated with retinal disorder: Secondary | ICD-10-CM

## 2021-07-13 NOTE — Telephone Encounter (Signed)
Copied from CRM [id number]. Topic: Referral - Status >> Jul 12, 2021  2:12 PM Randol Kern wrote: Reason for CRM: Pt called to report that she can be seen sooner for her OPHTHALMOLOGY referral if the referral is marked as urgent. Otherwise she will have to wait until October.  Pt would like a call back to discuss: [phone]

## 2021-07-13 NOTE — Telephone Encounter (Signed)
I have changed the referral to urgent for

## 2021-07-13 NOTE — Addendum Note (Signed)
Addended by: Lorre Munroe on: 07/13/2021 11:03 AM   Modules accepted: Orders

## 2021-07-18 ENCOUNTER — Other Ambulatory Visit: Payer: Self-pay | Admitting: Neurology

## 2021-07-18 DIAGNOSIS — H539 Unspecified visual disturbance: Secondary | ICD-10-CM

## 2021-07-20 ENCOUNTER — Ambulatory Visit
Admission: RE | Admit: 2021-07-20 | Discharge: 2021-07-20 | Disposition: A | Discharge status: Home / Self Care | Payer: Medicaid Other | Source: Ambulatory Visit | Attending: Neurology | Admitting: Neurology

## 2021-07-20 DIAGNOSIS — H538 Other visual disturbances: Secondary | ICD-10-CM | POA: Diagnosis not present

## 2021-07-20 DIAGNOSIS — R519 Headache, unspecified: Secondary | ICD-10-CM | POA: Diagnosis not present

## 2021-07-20 DIAGNOSIS — H539 Unspecified visual disturbance: Secondary | ICD-10-CM | POA: Diagnosis not present

## 2021-07-20 LAB — CSF CELL COUNT WITH DIFFERENTIAL
Eosinophils, CSF: 0 %
Lymphs, CSF: 67 %
Monocyte-Macrophage-Spinal Fluid: 33 %
RBC Count, CSF: 51 /mm3 — ABNORMAL HIGH (ref 0–3)
Segmented Neutrophils-CSF: 0 %
Tube #: 3
WBC, CSF: 5 /mm3 (ref 0–5)

## 2021-07-20 LAB — PROTEIN, CSF: Total  Protein, CSF: 41 mg/dL (ref 15–45)

## 2021-07-20 LAB — GLUCOSE, CSF: Glucose, CSF: 66 mg/dL (ref 40–70)

## 2021-07-20 IMAGING — RF DG FLUORO GUIDE SPINAL/SI JT INJ*L*
2 series · 2 of 2 positions shown · non-contrast
Comparison: None Available.

CLINICAL DATA: Patient with a history of headaches and visual
disturbances. Imaging studies concerning for intracranial
hypertension. Radiology team asked to perform a diagnostic and
therapeutic lumbar puncture.

EXAM:
DIAGNOSTIC LUMBAR PUNCTURE UNDER FLUOROSCOPIC GUIDANCE

[Series 1: cp_bariatric · 0.19mm/px · 1 of 1 slices shown (1 of 2)]
[im 1/1]
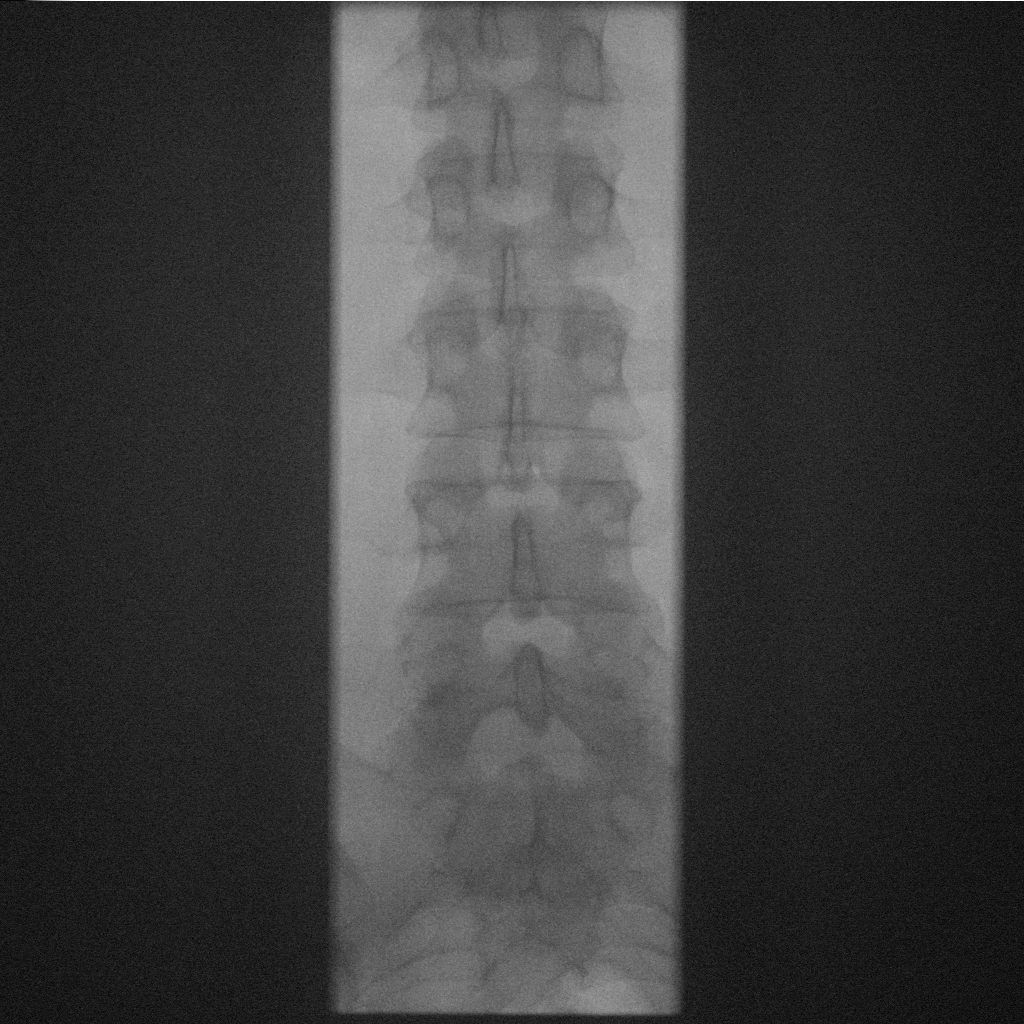

[Series 2: cp_bariatric · 0.18mm/px · 1 of 1 slices shown (2 of 2)]
[im 1/1]
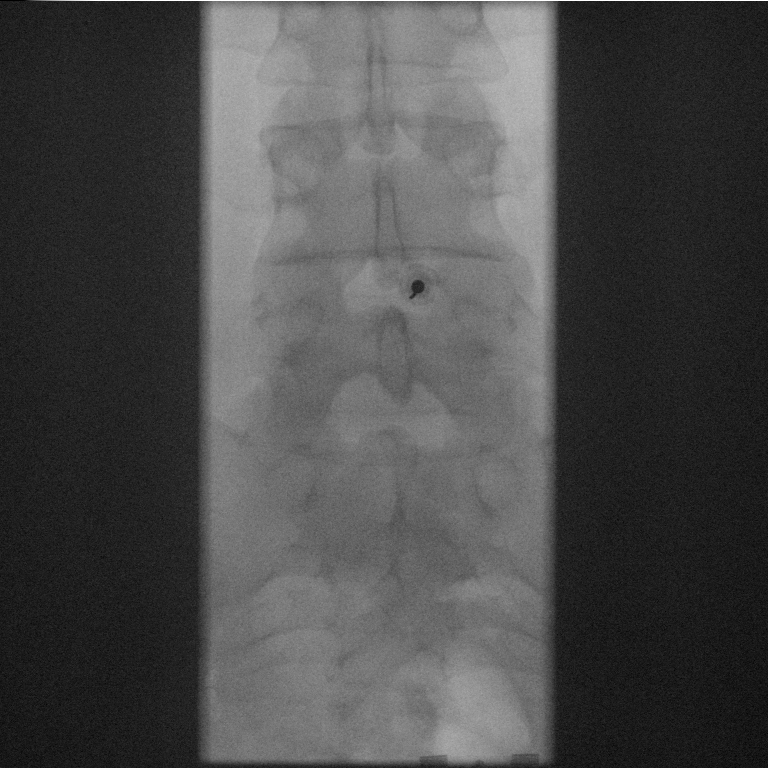

[2 of 2 positions shown; findings below may reference images not displayed]

FLUOROSCOPY:
Radiation Exposure Index (as provided by the fluoroscopic device):
23.3 mGy Kerma

PROCEDURE:
Informed consent was obtained from the patient prior to the
procedure, including potential complications of headache, allergy,
and pain. With the patient prone, the lower back was prepped with
Betadine. 1% Lidocaine was used for local anesthesia. Lumbar
puncture was performed at the L3-L4 level using a 22 gauge needle
with return of clear CSF with an opening pressure of 25 cm water. 7
ml of CSF were obtained for laboratory studies. Closing pressure of
16 cm water. The patient tolerated the procedure well and there were
no apparent complications.
IMPRESSION: Technically successful L3-L4 lumbar puncture yielding 7 mL clear CSF
for laboratory studies. Read by: DESRUMAUX, NP

## 2021-07-20 MED ORDER — LIDOCAINE HCL (PF) 1 % IJ SOLN
10.0000 mL | Freq: Once | INTRAMUSCULAR | Status: AC
  Administered 2021-07-20: 8 mL

## 2021-07-20 NOTE — Discharge Instructions (Signed)
Lumbar Puncture, Care After Refer to this sheet in the next few weeks. These instructions provide you with information on caring for yourself after your procedure. Your health care provider may also give you more specific instructions. Your treatment has been planned according to current medical practices, but problems sometimes occur. Call your health care provider if you have any problems or questions after your procedure. What can I expect after the procedure? After your procedure, it is typical to have the following sensations: Mild discomfort or pain at the insertion site. Mild headache that is relieved with pain medicines.  Follow these instructions at home:  Avoid lifting anything heavier than 10 lb (4.5 kg) for at least 12 hours after the procedure. Drink enough fluids to keep your urine clear or pale yellow. Lay flat or as flat as possible for the remainder of the day. Contact a health care provider if: You have fever or chills. You have nausea or vomiting. You have a headache that lasts for more than 2 days. Get help right away if: You have any numbness or tingling in your legs. You are unable to control your bowel or bladder. You have bleeding or swelling in your back at the insertion site. You are dizzy or faint. This information is not intended to replace advice given to you by your health care provider. Make sure you discuss any questions you have with your health care provider. Document Released: 02/10/2013 Document Revised: 07/14/2015 Document Reviewed: 10/14/2012 Elsevier Interactive Patient Education  2017 Elsevier Inc. 

## 2021-07-21 ENCOUNTER — Other Ambulatory Visit: Payer: Self-pay | Admitting: Neurology

## 2021-07-21 DIAGNOSIS — H539 Unspecified visual disturbance: Secondary | ICD-10-CM

## 2021-07-21 LAB — IGG CSF INDEX
Albumin CSF-mCnc: 24 mg/dL (ref 8–37)
Albumin: 4.3 g/dL (ref 3.8–4.8)
CSF IgG Index: 0.5 (ref 0.0–0.7)
IgG (Immunoglobin G), Serum: 1014 mg/dL (ref 586–1602)
IgG, CSF: 3 mg/dL (ref 0.0–6.7)
IgG/Alb Ratio, CSF: 0.13 (ref 0.00–0.25)

## 2021-07-25 ENCOUNTER — Ambulatory Visit: Payer: Medicaid Other | Admitting: Internal Medicine

## 2021-07-25 ENCOUNTER — Ambulatory Visit
Admission: RE | Admit: 2021-07-25 | Discharge: 2021-07-25 | Disposition: A | Discharge status: Home / Self Care | Payer: Medicaid Other | Source: Ambulatory Visit | Attending: Neurology | Admitting: Neurology

## 2021-07-25 DIAGNOSIS — I676 Nonpyogenic thrombosis of intracranial venous system: Secondary | ICD-10-CM | POA: Diagnosis not present

## 2021-07-25 DIAGNOSIS — H539 Unspecified visual disturbance: Secondary | ICD-10-CM | POA: Insufficient documentation

## 2021-07-25 DIAGNOSIS — G43519 Persistent migraine aura without cerebral infarction, intractable, without status migrainosus: Secondary | ICD-10-CM | POA: Insufficient documentation

## 2021-07-25 LAB — OLIGOCLONAL BANDS, CSF + SERM

## 2021-07-25 IMAGING — MR MR MRV HEAD WO/W CM
4 of 7 series · 20 of 48 positions shown · IV contrast (gadavist)
Comparison: Head MRI [DATE].

CLINICAL DATA: Headaches, vision changes, fatigue, and brain fog.

EXAM:
MR VENOGRAM HEAD WITHOUT AND WITH CONTRAST
TECHNIQUE: Angiographic images of the intracranial venous structures were
acquired using MRV technique without and with intravenous contrast.
CONTRAST:  9mL GADAVIST GADOBUTROL 1 MMOL/ML IV SOLN

[Series 5: TOF · coronal · 2.5mm · 0.98mm/px · 5 of 110 slices shown]
[im 1/110]
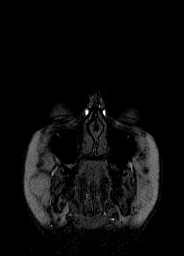
[im 28/110]
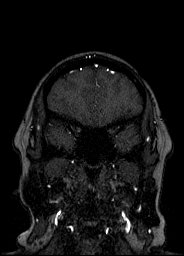
[im 55/110]
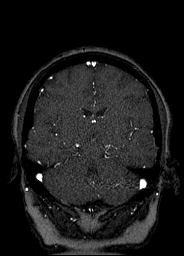
[im 82/110]
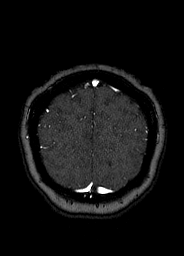
[im 110/110]
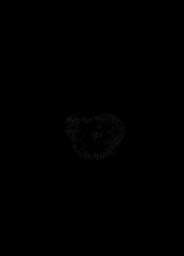

[Series 15: T1 · sagittal · 1.0mm · 0.98mm/px · 9 of 176 slices shown]
[im 1/176]
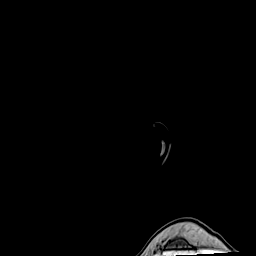
[im 22/176]
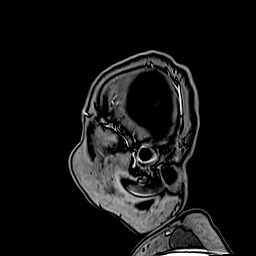
[im 44/176]
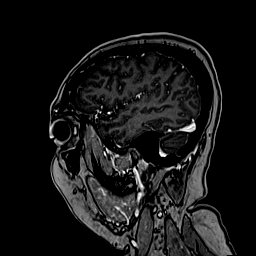
[im 66/176]
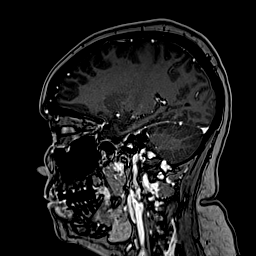
[im 88/176]
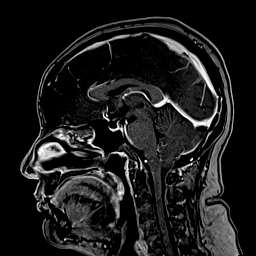
[im 110/176]
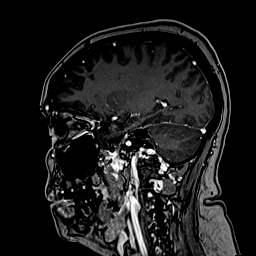
[im 132/176]
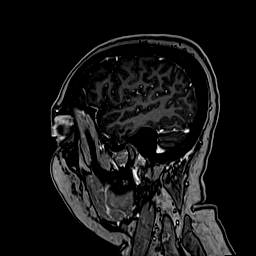
[im 154/176]
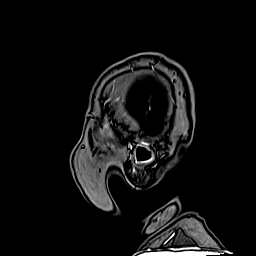
[im 176/176]
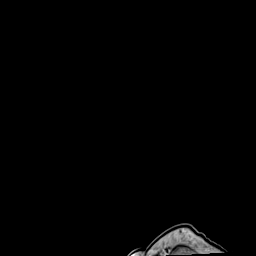

[Series 1057: mpr 1mm range · coronal · 1.0mm · 0.25mm/px · 3 of 197 slices shown]
[im 22/197]
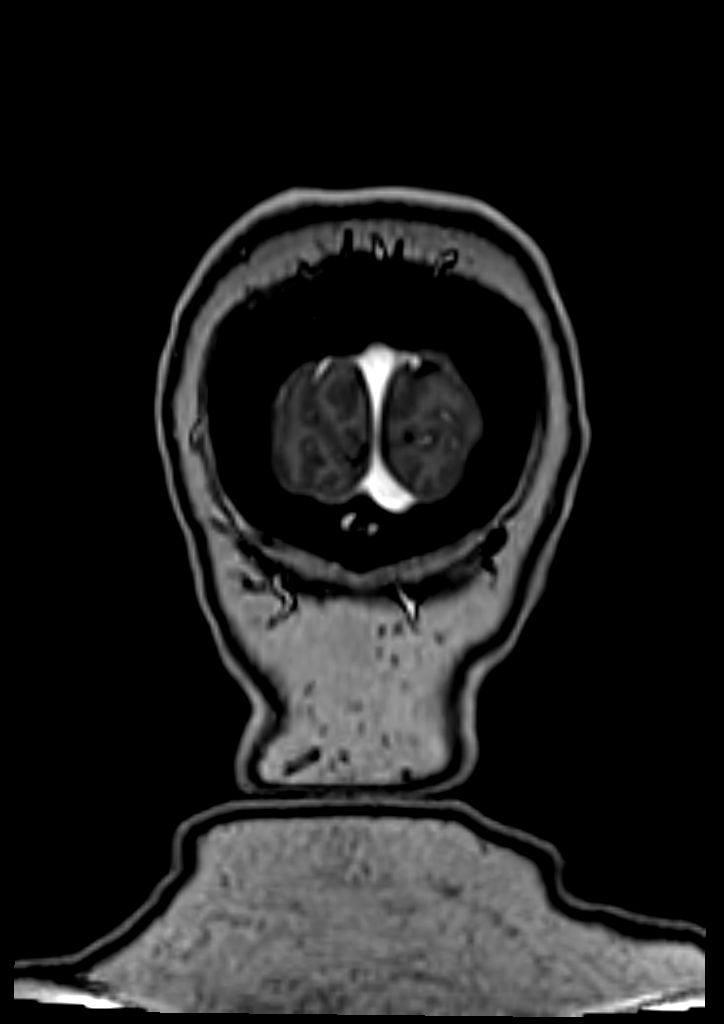
[im 109/197]
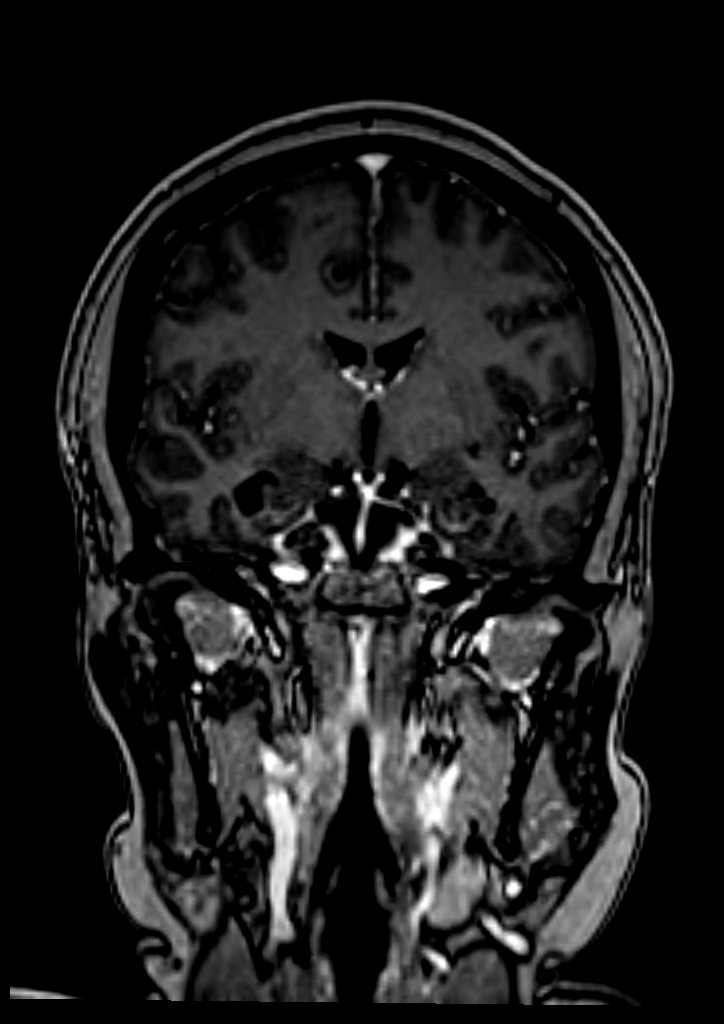
[im 175/197]
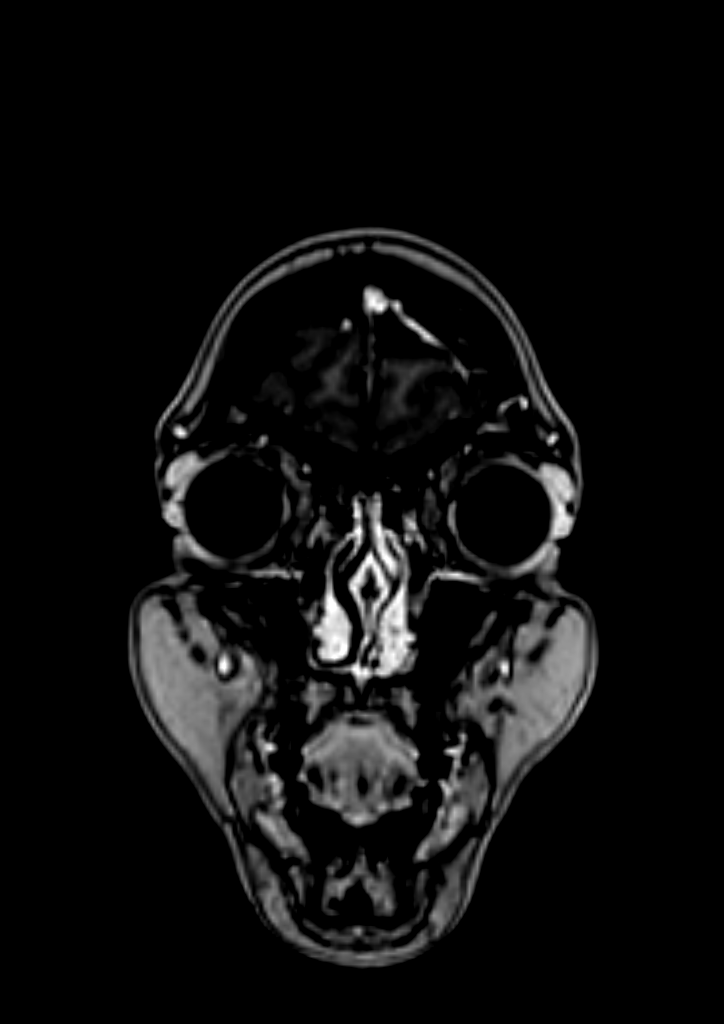

[Series 1063: mpr tra 1mm · axial · 1.0mm · 0.26mm/px · z∈[-104,+63]mm · 3 of 236 slices shown]
[im 43/236]
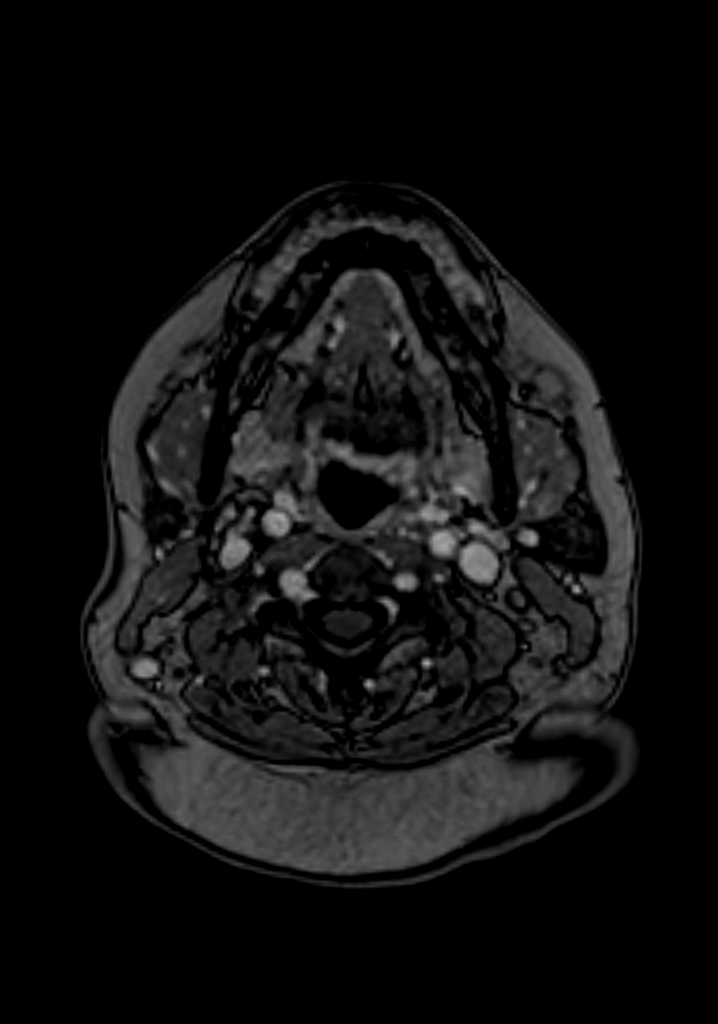
[im 129/236]
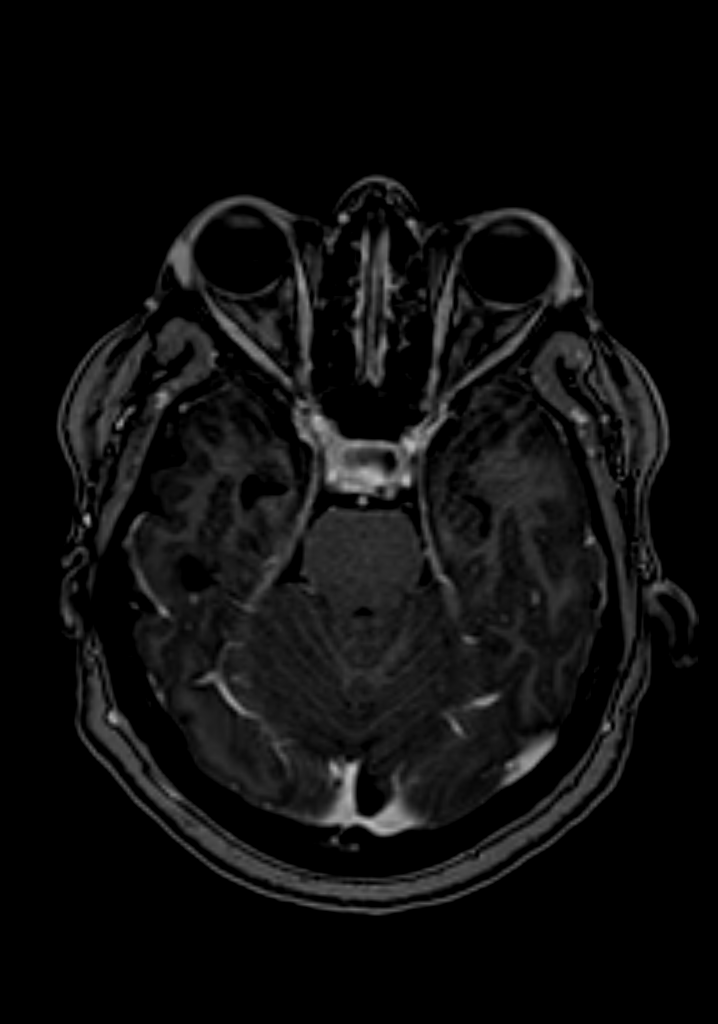
[im 214/236]
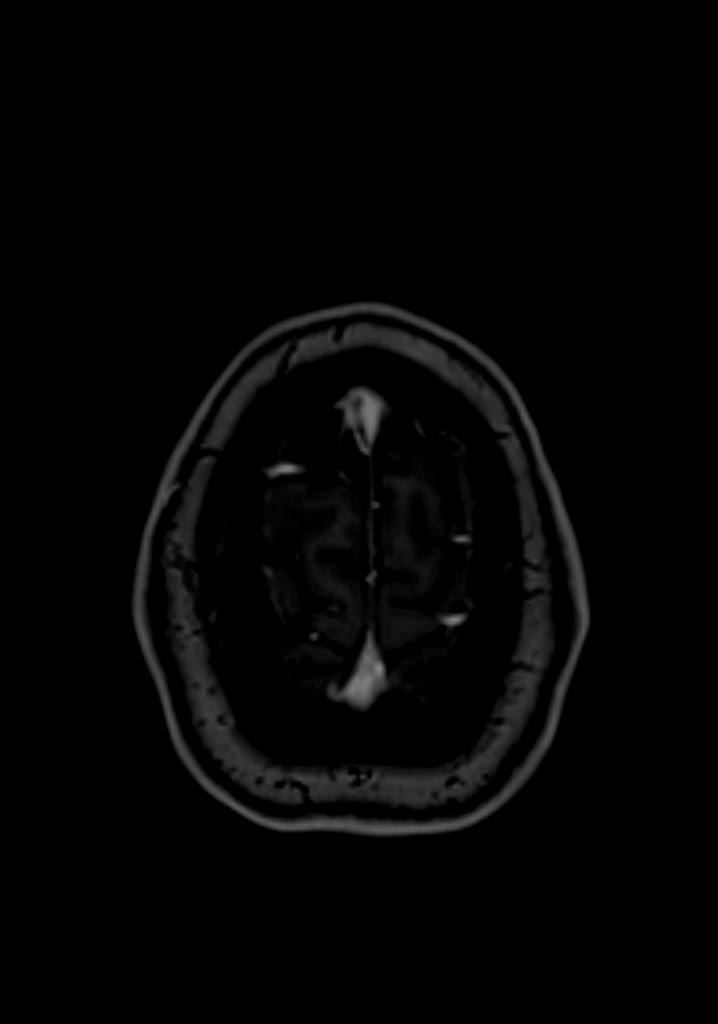

[20 of 48 positions shown; findings below may reference images not displayed]

FINDINGS: The superior sagittal sinus, internal cerebral veins, vein of FATOUMA BILENGILE,
straight sinus, transverse sinuses, sigmoid sinuses, and jugular
bulbs are patent without evidence of thrombus. There is a severe
stenosis of the distal right transverse sinus. A small filling
defect in the distal left transverse sinus is felt to reflect an
arachnoid granulation. A partially empty sella is again noted.
IMPRESSION: 1. No evidence of dural venous sinus thrombosis.
2. Distal right transverse sinus stenosis.

## 2021-07-25 MED ORDER — GADOBUTROL 1 MMOL/ML IV SOLN
9.0000 mL | Freq: Once | INTRAVENOUS | Status: AC | PRN
  Administered 2021-07-25: 9 mL via INTRAVENOUS

## 2021-07-31 DIAGNOSIS — R0683 Snoring: Secondary | ICD-10-CM | POA: Diagnosis not present

## 2021-07-31 DIAGNOSIS — G939 Disorder of brain, unspecified: Secondary | ICD-10-CM | POA: Diagnosis not present

## 2021-07-31 DIAGNOSIS — G43119 Migraine with aura, intractable, without status migrainosus: Secondary | ICD-10-CM | POA: Diagnosis not present

## 2021-07-31 DIAGNOSIS — G932 Benign intracranial hypertension: Secondary | ICD-10-CM | POA: Diagnosis not present

## 2021-08-03 DIAGNOSIS — R768 Other specified abnormal immunological findings in serum: Secondary | ICD-10-CM | POA: Diagnosis not present

## 2021-08-03 DIAGNOSIS — G932 Benign intracranial hypertension: Secondary | ICD-10-CM | POA: Diagnosis not present

## 2021-08-04 ENCOUNTER — Ambulatory Visit: Payer: Medicaid Other

## 2021-08-16 DIAGNOSIS — G932 Benign intracranial hypertension: Secondary | ICD-10-CM | POA: Diagnosis not present

## 2021-08-17 DIAGNOSIS — H4710 Unspecified papilledema: Secondary | ICD-10-CM | POA: Diagnosis not present

## 2021-08-17 DIAGNOSIS — G932 Benign intracranial hypertension: Secondary | ICD-10-CM | POA: Diagnosis not present

## 2021-08-21 DIAGNOSIS — H4710 Unspecified papilledema: Secondary | ICD-10-CM | POA: Diagnosis not present

## 2021-08-21 DIAGNOSIS — G932 Benign intracranial hypertension: Secondary | ICD-10-CM | POA: Diagnosis not present

## 2021-08-24 DIAGNOSIS — R768 Other specified abnormal immunological findings in serum: Secondary | ICD-10-CM | POA: Diagnosis not present

## 2021-09-14 DIAGNOSIS — G43119 Migraine with aura, intractable, without status migrainosus: Secondary | ICD-10-CM | POA: Diagnosis not present

## 2021-09-14 DIAGNOSIS — R413 Other amnesia: Secondary | ICD-10-CM | POA: Diagnosis not present

## 2021-09-14 DIAGNOSIS — R0683 Snoring: Secondary | ICD-10-CM | POA: Diagnosis not present

## 2021-09-14 DIAGNOSIS — G939 Disorder of brain, unspecified: Secondary | ICD-10-CM | POA: Diagnosis not present

## 2021-09-14 DIAGNOSIS — G932 Benign intracranial hypertension: Secondary | ICD-10-CM | POA: Diagnosis not present

## 2021-09-14 DIAGNOSIS — R5382 Chronic fatigue, unspecified: Secondary | ICD-10-CM | POA: Diagnosis not present

## 2021-10-17 DIAGNOSIS — M9902 Segmental and somatic dysfunction of thoracic region: Secondary | ICD-10-CM | POA: Diagnosis not present

## 2021-10-17 DIAGNOSIS — R519 Headache, unspecified: Secondary | ICD-10-CM | POA: Diagnosis not present

## 2021-10-17 DIAGNOSIS — M9901 Segmental and somatic dysfunction of cervical region: Secondary | ICD-10-CM | POA: Diagnosis not present

## 2021-10-17 DIAGNOSIS — M5414 Radiculopathy, thoracic region: Secondary | ICD-10-CM | POA: Diagnosis not present

## 2021-10-24 DIAGNOSIS — M9901 Segmental and somatic dysfunction of cervical region: Secondary | ICD-10-CM | POA: Diagnosis not present

## 2021-10-24 DIAGNOSIS — M9902 Segmental and somatic dysfunction of thoracic region: Secondary | ICD-10-CM | POA: Diagnosis not present

## 2021-10-24 DIAGNOSIS — R519 Headache, unspecified: Secondary | ICD-10-CM | POA: Diagnosis not present

## 2021-10-24 DIAGNOSIS — M5414 Radiculopathy, thoracic region: Secondary | ICD-10-CM | POA: Diagnosis not present

## 2021-10-25 ENCOUNTER — Telehealth: Payer: Self-pay | Admitting: Gastroenterology

## 2021-10-25 NOTE — Telephone Encounter (Signed)
Pt return call with more question about pre auth.

## 2021-10-25 NOTE — Telephone Encounter (Signed)
Pt call to see if anyone has any information on preauth.

## 2021-10-25 NOTE — Telephone Encounter (Signed)
Called patient and informed patient [employer] does not require PA for colonoscopy. She states that she called her insurance company and they do require it. They said we needed to fax clinicals to [phone]. Faxed the screening telephone call from 06/15/2021 to them. Informed her that Pre service has also called and said Pre authorization is not required

## 2021-10-25 NOTE — Telephone Encounter (Signed)
Pt left vmx message have question about preauth for producer .

## 2021-10-26 NOTE — Telephone Encounter (Signed)
Patient states that was a old message she left. She states she does not have no more questions

## 2021-10-30 ENCOUNTER — Telehealth: Payer: Self-pay | Admitting: Gastroenterology

## 2021-10-30 NOTE — Telephone Encounter (Signed)
Pt cancelled colonoscopy for 9/15./2023 will call back and resched

## 2021-10-30 NOTE — Telephone Encounter (Signed)
[facility] called about the dosage of the prescription for suprep would like a call back at [phone]

## 2021-10-31 DIAGNOSIS — M9901 Segmental and somatic dysfunction of cervical region: Secondary | ICD-10-CM | POA: Diagnosis not present

## 2021-10-31 DIAGNOSIS — M5414 Radiculopathy, thoracic region: Secondary | ICD-10-CM | POA: Diagnosis not present

## 2021-10-31 DIAGNOSIS — M9902 Segmental and somatic dysfunction of thoracic region: Secondary | ICD-10-CM | POA: Diagnosis not present

## 2021-10-31 DIAGNOSIS — R519 Headache, unspecified: Secondary | ICD-10-CM | POA: Diagnosis not present

## 2021-10-31 NOTE — Telephone Encounter (Signed)
Called pharmacy but they were closed until after 9 AM. Will call them later on the day.

## 2021-10-31 NOTE — Telephone Encounter (Addendum)
Called pharmacy to let them know that patient will no longer doing her procedure since she called to cancel it.

## 2021-11-03 ENCOUNTER — Encounter: Admission: RE | Payer: Self-pay | Source: Home / Self Care

## 2021-11-03 ENCOUNTER — Ambulatory Visit: Admission: RE | Admit: 2021-11-03 | Payer: Medicaid Other | Source: Home / Self Care | Admitting: Gastroenterology

## 2021-11-03 SURGERY — COLONOSCOPY WITH PROPOFOL
Anesthesia: General

## 2021-11-07 DIAGNOSIS — M9902 Segmental and somatic dysfunction of thoracic region: Secondary | ICD-10-CM | POA: Diagnosis not present

## 2021-11-07 DIAGNOSIS — R519 Headache, unspecified: Secondary | ICD-10-CM | POA: Diagnosis not present

## 2021-11-07 DIAGNOSIS — M9901 Segmental and somatic dysfunction of cervical region: Secondary | ICD-10-CM | POA: Diagnosis not present

## 2021-11-07 DIAGNOSIS — M5414 Radiculopathy, thoracic region: Secondary | ICD-10-CM | POA: Diagnosis not present

## 2021-11-21 DIAGNOSIS — R519 Headache, unspecified: Secondary | ICD-10-CM | POA: Diagnosis not present

## 2021-11-21 DIAGNOSIS — M5414 Radiculopathy, thoracic region: Secondary | ICD-10-CM | POA: Diagnosis not present

## 2021-11-21 DIAGNOSIS — M9902 Segmental and somatic dysfunction of thoracic region: Secondary | ICD-10-CM | POA: Diagnosis not present

## 2021-11-21 DIAGNOSIS — M9901 Segmental and somatic dysfunction of cervical region: Secondary | ICD-10-CM | POA: Diagnosis not present

## 2021-12-05 DIAGNOSIS — M9902 Segmental and somatic dysfunction of thoracic region: Secondary | ICD-10-CM | POA: Diagnosis not present

## 2021-12-05 DIAGNOSIS — M9901 Segmental and somatic dysfunction of cervical region: Secondary | ICD-10-CM | POA: Diagnosis not present

## 2021-12-05 DIAGNOSIS — R519 Headache, unspecified: Secondary | ICD-10-CM | POA: Diagnosis not present

## 2021-12-05 DIAGNOSIS — M5414 Radiculopathy, thoracic region: Secondary | ICD-10-CM | POA: Diagnosis not present

## 2021-12-19 DIAGNOSIS — R519 Headache, unspecified: Secondary | ICD-10-CM | POA: Diagnosis not present

## 2021-12-19 DIAGNOSIS — M5414 Radiculopathy, thoracic region: Secondary | ICD-10-CM | POA: Diagnosis not present

## 2021-12-19 DIAGNOSIS — M9901 Segmental and somatic dysfunction of cervical region: Secondary | ICD-10-CM | POA: Diagnosis not present

## 2021-12-19 DIAGNOSIS — M9902 Segmental and somatic dysfunction of thoracic region: Secondary | ICD-10-CM | POA: Diagnosis not present

## 2021-12-26 DIAGNOSIS — G4730 Sleep apnea, unspecified: Secondary | ICD-10-CM | POA: Diagnosis not present

## 2022-01-01 DIAGNOSIS — R519 Headache, unspecified: Secondary | ICD-10-CM | POA: Diagnosis not present

## 2022-01-01 DIAGNOSIS — M5414 Radiculopathy, thoracic region: Secondary | ICD-10-CM | POA: Diagnosis not present

## 2022-01-01 DIAGNOSIS — M9901 Segmental and somatic dysfunction of cervical region: Secondary | ICD-10-CM | POA: Diagnosis not present

## 2022-01-01 DIAGNOSIS — M9902 Segmental and somatic dysfunction of thoracic region: Secondary | ICD-10-CM | POA: Diagnosis not present

## 2022-01-04 ENCOUNTER — Encounter: Payer: Self-pay | Admitting: Advanced Practice Midwife

## 2022-01-04 ENCOUNTER — Ambulatory Visit (INDEPENDENT_AMBULATORY_CARE_PROVIDER_SITE_OTHER): Payer: Medicaid Other | Admitting: Advanced Practice Midwife

## 2022-01-04 VITALS — BP 127/85 | HR 79 | Wt 212.0 lb

## 2022-01-04 DIAGNOSIS — Z30432 Encounter for removal of intrauterine contraceptive device: Secondary | ICD-10-CM

## 2022-01-04 DIAGNOSIS — Z01419 Encounter for gynecological examination (general) (routine) without abnormal findings: Secondary | ICD-10-CM

## 2022-01-04 DIAGNOSIS — Z30011 Encounter for initial prescription of contraceptive pills: Secondary | ICD-10-CM

## 2022-01-04 MED ORDER — NORETHINDRONE 0.35 MG PO TABS: 1.0000 | ORAL_TABLET | Freq: Every day | ORAL | 0 refills | Status: DC

## 2022-01-04 NOTE — Progress Notes (Signed)
GYNECOLOGY ANNUAL PREVENTATIVE CARE ENCOUNTER NOTE  History:     [name] is a 46 y.o. [phone] female here for a routine annual gynecologic exam.  Current complaints: patient would like her Paragard IUD removed. She has a relatively recent diagnosis of Intracranial Hypertension and is concerned her IUD may be a factor. She would like to start POPs for contraception.   Denies abnormal vaginal bleeding, discharge, pelvic pain, problems with intercourse or other gynecologic concerns.   Patient also reports mild dyspareunia for about 50% of her encounters. She is not sure if mood, libido, position are factors.    Gynecologic History Patient's last menstrual period was 12/11/2021. Contraception: IUD Last Pap: 09/2020. Result was normal with negative HPV Last Mammogram: 11/22/2020.  Result was normal (visible in Care Everywhere)   Obstetric History OB History  Gravida Para Term Preterm AB Living  4 2 2   2 2   SAB IAB Ectopic Multiple Live Births  2     0 2    # Outcome Date GA Lbr Len/2nd Weight Sex Delivery Anes PTL Lv  4 Term 04/24/19 [redacted]w[redacted]d 03:32 / 00:06 6 lb 15.1 oz (3.15 kg) F Vag-Spont EPI  LIV  3 SAB 07/2018 [redacted]w[redacted]d         2 Term 11/30/03 [redacted]w[redacted]d  8 lb (3.629 kg) M Vag-Spont   LIV  1 SAB             Obstetric Comments  1st Menstrual Cycle:  13   1st Pregnancy:  27    Past Medical History:  Diagnosis Date   Depression    Diverticulosis 2018   Frequent headaches    Heart murmur    History of chicken pox     Past Surgical History:  Procedure Laterality Date   COLONOSCOPY WITH PROPOFOL N/A 11/28/2016   Procedure: COLONOSCOPY WITH PROPOFOL;  Surgeon: 01/28/2017, MD;  Location: [facility];  Service: Endoscopy;  Laterality: N/A;   EXCISIONAL HEMORRHOIDECTOMY  2014    Current Outpatient Medications on File Prior to Visit  Medication Sig Dispense Refill   acetaZOLAMIDE (DIAMOX) 250 MG tablet Take by mouth.     cetirizine (ZYRTEC) 10 MG tablet Take  by mouth daily.     fluticasone (FLONASE) 50 MCG/ACT nasal spray Place into both nostrils daily.     PARAGARD INTRAUTERINE COPPER IU by Intrauterine route.     phentermine 15 MG capsule Take 1 capsule (15 mg total) by mouth every morning. 30 capsule 1   valACYclovir (VALTREX) 1000 MG tablet Take 1 tablet (1,000 mg total) by mouth daily. Take for 5 days 5 tablet 3   No current facility-administered medications on file prior to visit.    No Known Allergies  Social History:  reports that she has never smoked. She has never used smokeless tobacco. She reports that she does not currently use alcohol. She reports that she does not use drugs.  Family History  Problem Relation Age of Onset   Heart disease Mother        2015    The following portions of the patient's history were reviewed and updated as appropriate: allergies, current medications, past family history, past medical history, past social history, past surgical history and problem list.  Review of Systems Pertinent items noted in HPI and remainder of comprehensive ROS otherwise negative.  Physical Exam:  BP 127/85   Pulse 79   Wt 212 lb (96.2 kg)   LMP 12/11/2021   BMI 36.39 kg/m  CONSTITUTIONAL: Well-developed, well-nourished female in no acute distress.  HENT:  Normocephalic, atraumatic, External right and left ear normal.  EYES: Conjunctivae and EOM are normal. Pupils are equal, round, and reactive to light. No scleral icterus.  NECK: Normal range of motion, supple, no masses.  Normal thyroid.  SKIN: Skin is warm and dry. No rash noted. Not diaphoretic. No erythema. No pallor. MUSCULOSKELETAL: Normal range of motion. No tenderness.  No cyanosis, clubbing, or edema. NEUROLOGIC: Alert and oriented to person, place, and time. Normal reflexes, muscle tone coordination.  PSYCHIATRIC: Normal mood and affect. Normal behavior. Normal judgment and thought content. CARDIOVASCULAR: Normal heart rate noted, regular  rhythm RESPIRATORY: Clear to auscultation bilaterally. Effort and breath sounds normal, no problems with respiration noted. BREASTS: Symmetric in size. No masses, tenderness, skin changes, nipple drainage, or lymphadenopathy bilaterally. Performed in the presence of a chaperone. ABDOMEN: Soft, no distention noted.  No tenderness, rebound or guarding.  PELVIC: Normal appearing external genitalia and urethral meatus; normal appearing vaginal mucosa and cervix.  Performed in the presence of a chaperone.   Assessment and Plan:    1. Well woman exam - Routine care -Pap UTD - MM 3D SCREEN BREAST BILATERAL; Future  2. Encounter for IUD removal - Appropriately positioned prior to removal - Shared formal data on hormonal contraception and IIH  3. Initiation of OCP (BCP) - Micronor to pharmacy  4. Dyspareunia -RTC in 3-6 months as needed for ongoing discussion of dyspareunia, improvement with removal of IUD, desire for further investigation  Mammogram scheduled Routine preventative health maintenance measures emphasized. Please refer to After Visit Summary for other counseling recommendations.      Clayton Bibles, MSA, MSN, CNM Certified Nurse Midwife, Biochemist, clinical for [employer], [facility]

## 2022-01-04 NOTE — Progress Notes (Signed)
   AEX / IUD Removal  Last Pap: 09/19/2020 WNL STD Screening:  LMP: 12/11/21 Monthly  2022 Mammogram  Pt considering doing mini pill.   Pt currently seeing Neorologist Dr.Shaw   Needs Mammogram done at [facility].

## 2022-01-16 DIAGNOSIS — G43119 Migraine with aura, intractable, without status migrainosus: Secondary | ICD-10-CM | POA: Diagnosis not present

## 2022-01-16 DIAGNOSIS — R0683 Snoring: Secondary | ICD-10-CM | POA: Diagnosis not present

## 2022-01-16 DIAGNOSIS — G932 Benign intracranial hypertension: Secondary | ICD-10-CM | POA: Diagnosis not present

## 2022-01-16 DIAGNOSIS — Z1231 Encounter for screening mammogram for malignant neoplasm of breast: Secondary | ICD-10-CM | POA: Diagnosis not present

## 2022-01-16 DIAGNOSIS — H4713 Papilledema associated with retinal disorder: Secondary | ICD-10-CM | POA: Diagnosis not present

## 2022-01-16 DIAGNOSIS — R5382 Chronic fatigue, unspecified: Secondary | ICD-10-CM | POA: Diagnosis not present

## 2022-01-16 DIAGNOSIS — Z789 Other specified health status: Secondary | ICD-10-CM | POA: Diagnosis not present

## 2022-01-16 DIAGNOSIS — G939 Disorder of brain, unspecified: Secondary | ICD-10-CM | POA: Diagnosis not present

## 2022-01-16 DIAGNOSIS — R413 Other amnesia: Secondary | ICD-10-CM | POA: Diagnosis not present

## 2022-04-18 DIAGNOSIS — G932 Benign intracranial hypertension: Secondary | ICD-10-CM | POA: Diagnosis not present

## 2022-04-18 DIAGNOSIS — R5382 Chronic fatigue, unspecified: Secondary | ICD-10-CM | POA: Diagnosis not present

## 2022-04-18 DIAGNOSIS — G43119 Migraine with aura, intractable, without status migrainosus: Secondary | ICD-10-CM | POA: Diagnosis not present

## 2022-04-18 DIAGNOSIS — R413 Other amnesia: Secondary | ICD-10-CM | POA: Diagnosis not present

## 2022-04-18 DIAGNOSIS — R0683 Snoring: Secondary | ICD-10-CM | POA: Diagnosis not present

## 2022-04-18 DIAGNOSIS — G939 Disorder of brain, unspecified: Secondary | ICD-10-CM | POA: Diagnosis not present

## 2022-09-13 DIAGNOSIS — H538 Other visual disturbances: Secondary | ICD-10-CM | POA: Diagnosis not present

## 2022-09-20 DIAGNOSIS — G43119 Migraine with aura, intractable, without status migrainosus: Secondary | ICD-10-CM | POA: Diagnosis not present

## 2022-09-20 DIAGNOSIS — Z789 Other specified health status: Secondary | ICD-10-CM | POA: Diagnosis not present

## 2022-09-20 DIAGNOSIS — R413 Other amnesia: Secondary | ICD-10-CM | POA: Diagnosis not present

## 2022-09-20 DIAGNOSIS — G939 Disorder of brain, unspecified: Secondary | ICD-10-CM | POA: Diagnosis not present

## 2022-09-20 DIAGNOSIS — G932 Benign intracranial hypertension: Secondary | ICD-10-CM | POA: Diagnosis not present

## 2022-09-25 DIAGNOSIS — M545 Low back pain, unspecified: Secondary | ICD-10-CM | POA: Diagnosis not present

## 2022-09-25 DIAGNOSIS — M9903 Segmental and somatic dysfunction of lumbar region: Secondary | ICD-10-CM | POA: Diagnosis not present

## 2022-09-25 DIAGNOSIS — M546 Pain in thoracic spine: Secondary | ICD-10-CM | POA: Diagnosis not present

## 2022-09-25 DIAGNOSIS — M542 Cervicalgia: Secondary | ICD-10-CM | POA: Diagnosis not present

## 2022-09-25 DIAGNOSIS — M9901 Segmental and somatic dysfunction of cervical region: Secondary | ICD-10-CM | POA: Diagnosis not present

## 2022-09-25 DIAGNOSIS — R519 Headache, unspecified: Secondary | ICD-10-CM | POA: Diagnosis not present

## 2022-09-25 DIAGNOSIS — M9902 Segmental and somatic dysfunction of thoracic region: Secondary | ICD-10-CM | POA: Diagnosis not present

## 2022-09-26 DIAGNOSIS — F4322 Adjustment disorder with anxiety: Secondary | ICD-10-CM | POA: Diagnosis not present

## 2022-09-27 DIAGNOSIS — M545 Low back pain, unspecified: Secondary | ICD-10-CM | POA: Diagnosis not present

## 2022-09-27 DIAGNOSIS — M9902 Segmental and somatic dysfunction of thoracic region: Secondary | ICD-10-CM | POA: Diagnosis not present

## 2022-09-27 DIAGNOSIS — M546 Pain in thoracic spine: Secondary | ICD-10-CM | POA: Diagnosis not present

## 2022-09-27 DIAGNOSIS — M542 Cervicalgia: Secondary | ICD-10-CM | POA: Diagnosis not present

## 2022-09-27 DIAGNOSIS — M9901 Segmental and somatic dysfunction of cervical region: Secondary | ICD-10-CM | POA: Diagnosis not present

## 2022-09-27 DIAGNOSIS — R519 Headache, unspecified: Secondary | ICD-10-CM | POA: Diagnosis not present

## 2022-09-27 DIAGNOSIS — M9903 Segmental and somatic dysfunction of lumbar region: Secondary | ICD-10-CM | POA: Diagnosis not present

## 2022-09-28 DIAGNOSIS — M9903 Segmental and somatic dysfunction of lumbar region: Secondary | ICD-10-CM | POA: Diagnosis not present

## 2022-09-28 DIAGNOSIS — M9901 Segmental and somatic dysfunction of cervical region: Secondary | ICD-10-CM | POA: Diagnosis not present

## 2022-09-28 DIAGNOSIS — M546 Pain in thoracic spine: Secondary | ICD-10-CM | POA: Diagnosis not present

## 2022-09-28 DIAGNOSIS — R519 Headache, unspecified: Secondary | ICD-10-CM | POA: Diagnosis not present

## 2022-09-28 DIAGNOSIS — M9902 Segmental and somatic dysfunction of thoracic region: Secondary | ICD-10-CM | POA: Diagnosis not present

## 2022-09-28 DIAGNOSIS — M542 Cervicalgia: Secondary | ICD-10-CM | POA: Diagnosis not present

## 2022-09-28 DIAGNOSIS — M545 Low back pain, unspecified: Secondary | ICD-10-CM | POA: Diagnosis not present

## 2022-10-01 DIAGNOSIS — M9903 Segmental and somatic dysfunction of lumbar region: Secondary | ICD-10-CM | POA: Diagnosis not present

## 2022-10-01 DIAGNOSIS — M545 Low back pain, unspecified: Secondary | ICD-10-CM | POA: Diagnosis not present

## 2022-10-01 DIAGNOSIS — M546 Pain in thoracic spine: Secondary | ICD-10-CM | POA: Diagnosis not present

## 2022-10-01 DIAGNOSIS — M9901 Segmental and somatic dysfunction of cervical region: Secondary | ICD-10-CM | POA: Diagnosis not present

## 2022-10-01 DIAGNOSIS — M9902 Segmental and somatic dysfunction of thoracic region: Secondary | ICD-10-CM | POA: Diagnosis not present

## 2022-10-01 DIAGNOSIS — R519 Headache, unspecified: Secondary | ICD-10-CM | POA: Diagnosis not present

## 2022-10-01 DIAGNOSIS — M542 Cervicalgia: Secondary | ICD-10-CM | POA: Diagnosis not present

## 2022-10-02 DIAGNOSIS — M9901 Segmental and somatic dysfunction of cervical region: Secondary | ICD-10-CM | POA: Diagnosis not present

## 2022-10-02 DIAGNOSIS — M9902 Segmental and somatic dysfunction of thoracic region: Secondary | ICD-10-CM | POA: Diagnosis not present

## 2022-10-02 DIAGNOSIS — M545 Low back pain, unspecified: Secondary | ICD-10-CM | POA: Diagnosis not present

## 2022-10-02 DIAGNOSIS — M542 Cervicalgia: Secondary | ICD-10-CM | POA: Diagnosis not present

## 2022-10-02 DIAGNOSIS — R519 Headache, unspecified: Secondary | ICD-10-CM | POA: Diagnosis not present

## 2022-10-02 DIAGNOSIS — M9903 Segmental and somatic dysfunction of lumbar region: Secondary | ICD-10-CM | POA: Diagnosis not present

## 2022-10-02 DIAGNOSIS — M546 Pain in thoracic spine: Secondary | ICD-10-CM | POA: Diagnosis not present

## 2022-10-03 DIAGNOSIS — M542 Cervicalgia: Secondary | ICD-10-CM | POA: Diagnosis not present

## 2022-10-03 DIAGNOSIS — M545 Low back pain, unspecified: Secondary | ICD-10-CM | POA: Diagnosis not present

## 2022-10-03 DIAGNOSIS — M9903 Segmental and somatic dysfunction of lumbar region: Secondary | ICD-10-CM | POA: Diagnosis not present

## 2022-10-03 DIAGNOSIS — M9901 Segmental and somatic dysfunction of cervical region: Secondary | ICD-10-CM | POA: Diagnosis not present

## 2022-10-03 DIAGNOSIS — R519 Headache, unspecified: Secondary | ICD-10-CM | POA: Diagnosis not present

## 2022-10-03 DIAGNOSIS — M9902 Segmental and somatic dysfunction of thoracic region: Secondary | ICD-10-CM | POA: Diagnosis not present

## 2022-10-03 DIAGNOSIS — M546 Pain in thoracic spine: Secondary | ICD-10-CM | POA: Diagnosis not present

## 2022-10-08 DIAGNOSIS — M9903 Segmental and somatic dysfunction of lumbar region: Secondary | ICD-10-CM | POA: Diagnosis not present

## 2022-10-08 DIAGNOSIS — M542 Cervicalgia: Secondary | ICD-10-CM | POA: Diagnosis not present

## 2022-10-08 DIAGNOSIS — R519 Headache, unspecified: Secondary | ICD-10-CM | POA: Diagnosis not present

## 2022-10-08 DIAGNOSIS — M9902 Segmental and somatic dysfunction of thoracic region: Secondary | ICD-10-CM | POA: Diagnosis not present

## 2022-10-08 DIAGNOSIS — M9901 Segmental and somatic dysfunction of cervical region: Secondary | ICD-10-CM | POA: Diagnosis not present

## 2022-10-08 DIAGNOSIS — M545 Low back pain, unspecified: Secondary | ICD-10-CM | POA: Diagnosis not present

## 2022-10-08 DIAGNOSIS — F4322 Adjustment disorder with anxiety: Secondary | ICD-10-CM | POA: Diagnosis not present

## 2022-10-08 DIAGNOSIS — M546 Pain in thoracic spine: Secondary | ICD-10-CM | POA: Diagnosis not present

## 2022-10-09 DIAGNOSIS — M545 Low back pain, unspecified: Secondary | ICD-10-CM | POA: Diagnosis not present

## 2022-10-09 DIAGNOSIS — M9901 Segmental and somatic dysfunction of cervical region: Secondary | ICD-10-CM | POA: Diagnosis not present

## 2022-10-09 DIAGNOSIS — R519 Headache, unspecified: Secondary | ICD-10-CM | POA: Diagnosis not present

## 2022-10-09 DIAGNOSIS — M542 Cervicalgia: Secondary | ICD-10-CM | POA: Diagnosis not present

## 2022-10-09 DIAGNOSIS — M9903 Segmental and somatic dysfunction of lumbar region: Secondary | ICD-10-CM | POA: Diagnosis not present

## 2022-10-09 DIAGNOSIS — M9902 Segmental and somatic dysfunction of thoracic region: Secondary | ICD-10-CM | POA: Diagnosis not present

## 2022-10-09 DIAGNOSIS — M546 Pain in thoracic spine: Secondary | ICD-10-CM | POA: Diagnosis not present

## 2022-10-11 DIAGNOSIS — M546 Pain in thoracic spine: Secondary | ICD-10-CM | POA: Diagnosis not present

## 2022-10-11 DIAGNOSIS — R519 Headache, unspecified: Secondary | ICD-10-CM | POA: Diagnosis not present

## 2022-10-11 DIAGNOSIS — M545 Low back pain, unspecified: Secondary | ICD-10-CM | POA: Diagnosis not present

## 2022-10-11 DIAGNOSIS — M9902 Segmental and somatic dysfunction of thoracic region: Secondary | ICD-10-CM | POA: Diagnosis not present

## 2022-10-11 DIAGNOSIS — M9903 Segmental and somatic dysfunction of lumbar region: Secondary | ICD-10-CM | POA: Diagnosis not present

## 2022-10-11 DIAGNOSIS — M542 Cervicalgia: Secondary | ICD-10-CM | POA: Diagnosis not present

## 2022-10-11 DIAGNOSIS — M9901 Segmental and somatic dysfunction of cervical region: Secondary | ICD-10-CM | POA: Diagnosis not present

## 2022-10-15 DIAGNOSIS — M9902 Segmental and somatic dysfunction of thoracic region: Secondary | ICD-10-CM | POA: Diagnosis not present

## 2022-10-15 DIAGNOSIS — M9903 Segmental and somatic dysfunction of lumbar region: Secondary | ICD-10-CM | POA: Diagnosis not present

## 2022-10-15 DIAGNOSIS — M546 Pain in thoracic spine: Secondary | ICD-10-CM | POA: Diagnosis not present

## 2022-10-15 DIAGNOSIS — M545 Low back pain, unspecified: Secondary | ICD-10-CM | POA: Diagnosis not present

## 2022-10-15 DIAGNOSIS — M9901 Segmental and somatic dysfunction of cervical region: Secondary | ICD-10-CM | POA: Diagnosis not present

## 2022-10-15 DIAGNOSIS — M542 Cervicalgia: Secondary | ICD-10-CM | POA: Diagnosis not present

## 2022-10-16 DIAGNOSIS — M9903 Segmental and somatic dysfunction of lumbar region: Secondary | ICD-10-CM | POA: Diagnosis not present

## 2022-10-16 DIAGNOSIS — M545 Low back pain, unspecified: Secondary | ICD-10-CM | POA: Diagnosis not present

## 2022-10-16 DIAGNOSIS — M9901 Segmental and somatic dysfunction of cervical region: Secondary | ICD-10-CM | POA: Diagnosis not present

## 2022-10-16 DIAGNOSIS — M9902 Segmental and somatic dysfunction of thoracic region: Secondary | ICD-10-CM | POA: Diagnosis not present

## 2022-10-16 DIAGNOSIS — M546 Pain in thoracic spine: Secondary | ICD-10-CM | POA: Diagnosis not present

## 2022-10-16 DIAGNOSIS — M542 Cervicalgia: Secondary | ICD-10-CM | POA: Diagnosis not present

## 2022-10-18 DIAGNOSIS — F411 Generalized anxiety disorder: Secondary | ICD-10-CM | POA: Diagnosis not present

## 2022-10-19 DIAGNOSIS — M9902 Segmental and somatic dysfunction of thoracic region: Secondary | ICD-10-CM | POA: Diagnosis not present

## 2022-10-19 DIAGNOSIS — M546 Pain in thoracic spine: Secondary | ICD-10-CM | POA: Diagnosis not present

## 2022-10-19 DIAGNOSIS — M545 Low back pain, unspecified: Secondary | ICD-10-CM | POA: Diagnosis not present

## 2022-10-19 DIAGNOSIS — M542 Cervicalgia: Secondary | ICD-10-CM | POA: Diagnosis not present

## 2022-10-19 DIAGNOSIS — M9901 Segmental and somatic dysfunction of cervical region: Secondary | ICD-10-CM | POA: Diagnosis not present

## 2022-10-19 DIAGNOSIS — M9903 Segmental and somatic dysfunction of lumbar region: Secondary | ICD-10-CM | POA: Diagnosis not present

## 2022-10-23 DIAGNOSIS — M9901 Segmental and somatic dysfunction of cervical region: Secondary | ICD-10-CM | POA: Diagnosis not present

## 2022-10-23 DIAGNOSIS — M9903 Segmental and somatic dysfunction of lumbar region: Secondary | ICD-10-CM | POA: Diagnosis not present

## 2022-10-23 DIAGNOSIS — M545 Low back pain, unspecified: Secondary | ICD-10-CM | POA: Diagnosis not present

## 2022-10-23 DIAGNOSIS — M546 Pain in thoracic spine: Secondary | ICD-10-CM | POA: Diagnosis not present

## 2022-10-23 DIAGNOSIS — M542 Cervicalgia: Secondary | ICD-10-CM | POA: Diagnosis not present

## 2022-10-23 DIAGNOSIS — M9902 Segmental and somatic dysfunction of thoracic region: Secondary | ICD-10-CM | POA: Diagnosis not present

## 2022-10-25 DIAGNOSIS — M9901 Segmental and somatic dysfunction of cervical region: Secondary | ICD-10-CM | POA: Diagnosis not present

## 2022-10-25 DIAGNOSIS — M9903 Segmental and somatic dysfunction of lumbar region: Secondary | ICD-10-CM | POA: Diagnosis not present

## 2022-10-25 DIAGNOSIS — F411 Generalized anxiety disorder: Secondary | ICD-10-CM | POA: Diagnosis not present

## 2022-10-25 DIAGNOSIS — M545 Low back pain, unspecified: Secondary | ICD-10-CM | POA: Diagnosis not present

## 2022-10-25 DIAGNOSIS — M9902 Segmental and somatic dysfunction of thoracic region: Secondary | ICD-10-CM | POA: Diagnosis not present

## 2022-10-30 DIAGNOSIS — M9901 Segmental and somatic dysfunction of cervical region: Secondary | ICD-10-CM | POA: Diagnosis not present

## 2022-10-30 DIAGNOSIS — M9902 Segmental and somatic dysfunction of thoracic region: Secondary | ICD-10-CM | POA: Diagnosis not present

## 2022-10-30 DIAGNOSIS — M545 Low back pain, unspecified: Secondary | ICD-10-CM | POA: Diagnosis not present

## 2022-10-30 DIAGNOSIS — M9903 Segmental and somatic dysfunction of lumbar region: Secondary | ICD-10-CM | POA: Diagnosis not present

## 2022-11-01 DIAGNOSIS — F4322 Adjustment disorder with anxiety: Secondary | ICD-10-CM | POA: Diagnosis not present

## 2022-11-08 DIAGNOSIS — F4322 Adjustment disorder with anxiety: Secondary | ICD-10-CM | POA: Diagnosis not present

## 2022-11-15 DIAGNOSIS — M9902 Segmental and somatic dysfunction of thoracic region: Secondary | ICD-10-CM | POA: Diagnosis not present

## 2022-11-15 DIAGNOSIS — M9901 Segmental and somatic dysfunction of cervical region: Secondary | ICD-10-CM | POA: Diagnosis not present

## 2022-11-15 DIAGNOSIS — M9903 Segmental and somatic dysfunction of lumbar region: Secondary | ICD-10-CM | POA: Diagnosis not present

## 2022-11-15 DIAGNOSIS — F4322 Adjustment disorder with anxiety: Secondary | ICD-10-CM | POA: Diagnosis not present

## 2022-11-15 DIAGNOSIS — M545 Low back pain, unspecified: Secondary | ICD-10-CM | POA: Diagnosis not present

## 2022-11-20 DIAGNOSIS — M9902 Segmental and somatic dysfunction of thoracic region: Secondary | ICD-10-CM | POA: Diagnosis not present

## 2022-11-20 DIAGNOSIS — M9903 Segmental and somatic dysfunction of lumbar region: Secondary | ICD-10-CM | POA: Diagnosis not present

## 2022-11-20 DIAGNOSIS — M545 Low back pain, unspecified: Secondary | ICD-10-CM | POA: Diagnosis not present

## 2022-11-20 DIAGNOSIS — M9901 Segmental and somatic dysfunction of cervical region: Secondary | ICD-10-CM | POA: Diagnosis not present

## 2022-11-22 DIAGNOSIS — M9903 Segmental and somatic dysfunction of lumbar region: Secondary | ICD-10-CM | POA: Diagnosis not present

## 2022-11-22 DIAGNOSIS — F4322 Adjustment disorder with anxiety: Secondary | ICD-10-CM | POA: Diagnosis not present

## 2022-11-22 DIAGNOSIS — M9901 Segmental and somatic dysfunction of cervical region: Secondary | ICD-10-CM | POA: Diagnosis not present

## 2022-11-22 DIAGNOSIS — M545 Low back pain, unspecified: Secondary | ICD-10-CM | POA: Diagnosis not present

## 2022-11-22 DIAGNOSIS — M9902 Segmental and somatic dysfunction of thoracic region: Secondary | ICD-10-CM | POA: Diagnosis not present

## 2022-11-27 DIAGNOSIS — M9901 Segmental and somatic dysfunction of cervical region: Secondary | ICD-10-CM | POA: Diagnosis not present

## 2022-11-27 DIAGNOSIS — M9903 Segmental and somatic dysfunction of lumbar region: Secondary | ICD-10-CM | POA: Diagnosis not present

## 2022-11-27 DIAGNOSIS — M545 Low back pain, unspecified: Secondary | ICD-10-CM | POA: Diagnosis not present

## 2022-11-27 DIAGNOSIS — M9902 Segmental and somatic dysfunction of thoracic region: Secondary | ICD-10-CM | POA: Diagnosis not present

## 2022-11-29 DIAGNOSIS — M9903 Segmental and somatic dysfunction of lumbar region: Secondary | ICD-10-CM | POA: Diagnosis not present

## 2022-11-29 DIAGNOSIS — M9901 Segmental and somatic dysfunction of cervical region: Secondary | ICD-10-CM | POA: Diagnosis not present

## 2022-11-29 DIAGNOSIS — M9902 Segmental and somatic dysfunction of thoracic region: Secondary | ICD-10-CM | POA: Diagnosis not present

## 2022-11-29 DIAGNOSIS — M545 Low back pain, unspecified: Secondary | ICD-10-CM | POA: Diagnosis not present

## 2022-12-04 DIAGNOSIS — M9901 Segmental and somatic dysfunction of cervical region: Secondary | ICD-10-CM | POA: Diagnosis not present

## 2022-12-04 DIAGNOSIS — M9903 Segmental and somatic dysfunction of lumbar region: Secondary | ICD-10-CM | POA: Diagnosis not present

## 2022-12-04 DIAGNOSIS — M545 Low back pain, unspecified: Secondary | ICD-10-CM | POA: Diagnosis not present

## 2022-12-04 DIAGNOSIS — M9902 Segmental and somatic dysfunction of thoracic region: Secondary | ICD-10-CM | POA: Diagnosis not present

## 2022-12-07 DIAGNOSIS — M9901 Segmental and somatic dysfunction of cervical region: Secondary | ICD-10-CM | POA: Diagnosis not present

## 2022-12-07 DIAGNOSIS — M9903 Segmental and somatic dysfunction of lumbar region: Secondary | ICD-10-CM | POA: Diagnosis not present

## 2022-12-07 DIAGNOSIS — M9902 Segmental and somatic dysfunction of thoracic region: Secondary | ICD-10-CM | POA: Diagnosis not present

## 2022-12-07 DIAGNOSIS — M545 Low back pain, unspecified: Secondary | ICD-10-CM | POA: Diagnosis not present

## 2022-12-11 DIAGNOSIS — M9903 Segmental and somatic dysfunction of lumbar region: Secondary | ICD-10-CM | POA: Diagnosis not present

## 2022-12-11 DIAGNOSIS — M9901 Segmental and somatic dysfunction of cervical region: Secondary | ICD-10-CM | POA: Diagnosis not present

## 2022-12-11 DIAGNOSIS — M545 Low back pain, unspecified: Secondary | ICD-10-CM | POA: Diagnosis not present

## 2022-12-11 DIAGNOSIS — M9902 Segmental and somatic dysfunction of thoracic region: Secondary | ICD-10-CM | POA: Diagnosis not present

## 2022-12-13 DIAGNOSIS — M9902 Segmental and somatic dysfunction of thoracic region: Secondary | ICD-10-CM | POA: Diagnosis not present

## 2022-12-13 DIAGNOSIS — M545 Low back pain, unspecified: Secondary | ICD-10-CM | POA: Diagnosis not present

## 2022-12-13 DIAGNOSIS — M9903 Segmental and somatic dysfunction of lumbar region: Secondary | ICD-10-CM | POA: Diagnosis not present

## 2022-12-13 DIAGNOSIS — M9901 Segmental and somatic dysfunction of cervical region: Secondary | ICD-10-CM | POA: Diagnosis not present

## 2022-12-18 DIAGNOSIS — M9902 Segmental and somatic dysfunction of thoracic region: Secondary | ICD-10-CM | POA: Diagnosis not present

## 2022-12-18 DIAGNOSIS — M9901 Segmental and somatic dysfunction of cervical region: Secondary | ICD-10-CM | POA: Diagnosis not present

## 2022-12-18 DIAGNOSIS — M9903 Segmental and somatic dysfunction of lumbar region: Secondary | ICD-10-CM | POA: Diagnosis not present

## 2022-12-18 DIAGNOSIS — M545 Low back pain, unspecified: Secondary | ICD-10-CM | POA: Diagnosis not present

## 2022-12-20 DIAGNOSIS — M545 Low back pain, unspecified: Secondary | ICD-10-CM | POA: Diagnosis not present

## 2022-12-20 DIAGNOSIS — M9902 Segmental and somatic dysfunction of thoracic region: Secondary | ICD-10-CM | POA: Diagnosis not present

## 2022-12-20 DIAGNOSIS — M9903 Segmental and somatic dysfunction of lumbar region: Secondary | ICD-10-CM | POA: Diagnosis not present

## 2022-12-20 DIAGNOSIS — M9901 Segmental and somatic dysfunction of cervical region: Secondary | ICD-10-CM | POA: Diagnosis not present

## 2022-12-24 DIAGNOSIS — M545 Low back pain, unspecified: Secondary | ICD-10-CM | POA: Diagnosis not present

## 2022-12-24 DIAGNOSIS — M9901 Segmental and somatic dysfunction of cervical region: Secondary | ICD-10-CM | POA: Diagnosis not present

## 2022-12-24 DIAGNOSIS — M9902 Segmental and somatic dysfunction of thoracic region: Secondary | ICD-10-CM | POA: Diagnosis not present

## 2022-12-24 DIAGNOSIS — M9903 Segmental and somatic dysfunction of lumbar region: Secondary | ICD-10-CM | POA: Diagnosis not present

## 2022-12-27 DIAGNOSIS — M545 Low back pain, unspecified: Secondary | ICD-10-CM | POA: Diagnosis not present

## 2022-12-27 DIAGNOSIS — M9903 Segmental and somatic dysfunction of lumbar region: Secondary | ICD-10-CM | POA: Diagnosis not present

## 2022-12-27 DIAGNOSIS — M9901 Segmental and somatic dysfunction of cervical region: Secondary | ICD-10-CM | POA: Diagnosis not present

## 2022-12-27 DIAGNOSIS — M9902 Segmental and somatic dysfunction of thoracic region: Secondary | ICD-10-CM | POA: Diagnosis not present

## 2023-01-01 DIAGNOSIS — M545 Low back pain, unspecified: Secondary | ICD-10-CM | POA: Diagnosis not present

## 2023-01-01 DIAGNOSIS — M9903 Segmental and somatic dysfunction of lumbar region: Secondary | ICD-10-CM | POA: Diagnosis not present

## 2023-01-01 DIAGNOSIS — M9902 Segmental and somatic dysfunction of thoracic region: Secondary | ICD-10-CM | POA: Diagnosis not present

## 2023-01-01 DIAGNOSIS — M9901 Segmental and somatic dysfunction of cervical region: Secondary | ICD-10-CM | POA: Diagnosis not present

## 2023-01-03 DIAGNOSIS — M9903 Segmental and somatic dysfunction of lumbar region: Secondary | ICD-10-CM | POA: Diagnosis not present

## 2023-01-03 DIAGNOSIS — M545 Low back pain, unspecified: Secondary | ICD-10-CM | POA: Diagnosis not present

## 2023-01-03 DIAGNOSIS — M9901 Segmental and somatic dysfunction of cervical region: Secondary | ICD-10-CM | POA: Diagnosis not present

## 2023-01-03 DIAGNOSIS — M9902 Segmental and somatic dysfunction of thoracic region: Secondary | ICD-10-CM | POA: Diagnosis not present

## 2023-01-08 ENCOUNTER — Encounter: Payer: Self-pay | Admitting: Obstetrics & Gynecology

## 2023-01-08 ENCOUNTER — Ambulatory Visit (INDEPENDENT_AMBULATORY_CARE_PROVIDER_SITE_OTHER): Payer: 59 | Admitting: Obstetrics & Gynecology

## 2023-01-08 VITALS — BP 142/92 | HR 92 | Ht 64.0 in | Wt 193.0 lb

## 2023-01-08 DIAGNOSIS — Z8619 Personal history of other infectious and parasitic diseases: Secondary | ICD-10-CM

## 2023-01-08 DIAGNOSIS — Z01419 Encounter for gynecological examination (general) (routine) without abnormal findings: Secondary | ICD-10-CM

## 2023-01-08 DIAGNOSIS — Z3041 Encounter for surveillance of contraceptive pills: Secondary | ICD-10-CM

## 2023-01-08 DIAGNOSIS — Z975 Presence of (intrauterine) contraceptive device: Secondary | ICD-10-CM | POA: Insufficient documentation

## 2023-01-08 MED ORDER — VALACYCLOVIR HCL 500 MG PO TABS: 500.0000 mg | ORAL_TABLET | Freq: Every day | ORAL | 12 refills | Status: AC

## 2023-01-08 MED ORDER — NORETHINDRONE 0.35 MG PO TABS: 1.0000 | ORAL_TABLET | Freq: Every day | ORAL | 4 refills | Status: DC

## 2023-01-08 NOTE — Progress Notes (Signed)
GYNECOLOGY ANNUAL PREVENTATIVE CARE ENCOUNTER NOTE  History:     [name] is a 47 y.o. [phone] female here for a routine annual gynecologic exam.  Current complaints: none.  Desires refill of her Valtrex and Micronor.  Still has some headaches during her periods, gets these on Micronor and feels that she still ovulates on this medication.  Had Paragard, this was removed last year  Denies abnormal vaginal bleeding, discharge, pelvic pain, problems with intercourse or other gynecologic concerns.    Gynecologic History Patient's last menstrual period was 01/04/2023 (approximate). Contraception: OCP (estrogen/progesterone) Last Pap: 09/19/2020. Result was normal with negative HPV Last Mammogram: 12/2021 at [facility].  Result was normal Last Colonoscopy: 2018.  Result was benign  Obstetric History OB History  Gravida Para Term Preterm AB Living  4 2 2   2 2   SAB IAB Ectopic Multiple Live Births  2     0 2    # Outcome Date GA Lbr Len/2nd Weight Sex Type Anes PTL Lv  4 Term 04/24/19 [redacted]w[redacted]d 03:32 / 00:06 6 lb 15.1 oz (3.15 kg) F Vag-Spont EPI  LIV  3 SAB 07/2018 [redacted]w[redacted]d         2 Term 11/30/03 [redacted]w[redacted]d  8 lb (3.629 kg) M Vag-Spont   LIV  1 SAB             Obstetric Comments  1st Menstrual Cycle:  13   1st Pregnancy:  27    Past Medical History:  Diagnosis Date   Depression    Diverticulosis 2018   Frequent headaches    Heart murmur    History of chicken pox    Idiopathic intracranial hypertension 07/12/2021    Past Surgical History:  Procedure Laterality Date   COLONOSCOPY WITH PROPOFOL N/A 11/28/2016   Procedure: COLONOSCOPY WITH PROPOFOL;  Surgeon: Earline Mayotte, MD;  Location: [facility];  Service: Endoscopy;  Laterality: N/A;   EXCISIONAL HEMORRHOIDECTOMY  2014    Current Outpatient Medications on File Prior to Visit  Medication Sig Dispense Refill   acetaZOLAMIDE (DIAMOX) 250 MG tablet Take 500 mg by mouth.     atomoxetine (STRATTERA) 80 MG  capsule Take 80 mg by mouth daily.     cetirizine (ZYRTEC) 10 MG tablet Take by mouth daily.     Cholecalciferol (VITAMIN D-1000 MAX ST) 25 MCG (1000 UT) tablet Take by mouth.     cyanocobalamin (VITAMIN B12) 1000 MCG tablet Take by mouth.     fluticasone (FLONASE) 50 MCG/ACT nasal spray Place into both nostrils daily.     magnesium oxide (MAG-OX) 400 MG tablet Take by mouth.     acetaZOLAMIDE (DIAMOX) 250 MG tablet Take by mouth.     PARAGARD INTRAUTERINE COPPER IU by Intrauterine route. (Patient not taking: Reported on 01/08/2023)     No current facility-administered medications on file prior to visit.    No Known Allergies  Social History:  reports that she has never smoked. She has never used smokeless tobacco. She reports that she does not currently use alcohol. She reports that she does not use drugs.  Family History  Problem Relation Age of Onset   Heart disease Mother        Posey Rea    The following portions of the patient's history were reviewed and updated as appropriate: allergies, current medications, past family history, past medical history, past social history, past surgical history and problem list.  Review of Systems Pertinent items noted in HPI and remainder of  comprehensive ROS otherwise negative.  Physical Exam:  BP (!) 142/92   Pulse 92   Ht 5\' 4"  (1.626 m)   Wt 193 lb (87.5 kg)   LMP 01/04/2023 (Approximate)   BMI 33.13 kg/m  CONSTITUTIONAL: Well-developed, well-nourished female in no acute distress.  HENT:  Normocephalic, atraumatic, External right and left ear normal.  EYES: Conjunctivae and EOM are normal. Pupils are equal, round, and reactive to light. No scleral icterus.  NECK: Normal range of motion, supple, no masses.  Normal thyroid.  SKIN: Skin is warm and dry. No rash noted. Not diaphoretic. No erythema. No pallor. MUSCULOSKELETAL: Normal range of motion. No tenderness.  No cyanosis, clubbing, or edema. NEUROLOGIC: Alert and oriented to person,  place, and time. Normal reflexes, muscle tone coordination.  PSYCHIATRIC: Normal mood and affect. Normal behavior. Normal judgment and thought content. CARDIOVASCULAR: Normal heart rate noted, regular rhythm RESPIRATORY: Clear to auscultation bilaterally. Effort and breath sounds normal, no problems with respiration noted. BREASTS: Symmetric in size. No masses, tenderness, skin changes, nipple drainage, or lymphadenopathy bilaterally. Performed in the presence of a chaperone. ABDOMEN: Soft, no distention noted.  No tenderness, rebound or guarding.  PELVIC: Normal appearing external genitalia and urethral meatus; normal appearing vaginal mucosa and cervix.  Bloody vaginal discharge noted, ongoing period.   Normal uterine size, no other palpable masses, no uterine or adnexal tenderness.  Performed in the presence of a chaperone.   Assessment and Plan:     1. History of herpes genitalis Valtrex prescribed. - valACYclovir (VALTREX) 500 MG tablet; Take 1 tablet (500 mg total) by mouth daily. Can increase to twice a day for 5 days in the event of a recurrence  Dispense: 30 tablet; Refill: 12  2. Oral contraceptive pill surveillance Gave two samples of Slynd to use in lieu of Micronor. She will consider this.  Micronor refilled for now.  - norethindrone (MICRONOR) 0.35 MG tablet; Take 1 tablet (0.35 mg total) by mouth daily.  Dispense: 84 tablet; Refill: 4  3. Well woman exam Pap, mammogram and colon cancer screening up to date. Routine preventative health maintenance measures emphasized. Please refer to After Visit Summary for other counseling recommendations.      Jaynie Collins, MD, FACOG Obstetrician & Gynecologist, [facility] for [employer], [facility]

## 2023-02-26 DIAGNOSIS — M9903 Segmental and somatic dysfunction of lumbar region: Secondary | ICD-10-CM | POA: Diagnosis not present

## 2023-02-26 DIAGNOSIS — M9902 Segmental and somatic dysfunction of thoracic region: Secondary | ICD-10-CM | POA: Diagnosis not present

## 2023-02-26 DIAGNOSIS — M545 Low back pain, unspecified: Secondary | ICD-10-CM | POA: Diagnosis not present

## 2023-02-26 DIAGNOSIS — M9901 Segmental and somatic dysfunction of cervical region: Secondary | ICD-10-CM | POA: Diagnosis not present

## 2023-02-28 DIAGNOSIS — M9901 Segmental and somatic dysfunction of cervical region: Secondary | ICD-10-CM | POA: Diagnosis not present

## 2023-02-28 DIAGNOSIS — M9902 Segmental and somatic dysfunction of thoracic region: Secondary | ICD-10-CM | POA: Diagnosis not present

## 2023-02-28 DIAGNOSIS — M545 Low back pain, unspecified: Secondary | ICD-10-CM | POA: Diagnosis not present

## 2023-02-28 DIAGNOSIS — M9903 Segmental and somatic dysfunction of lumbar region: Secondary | ICD-10-CM | POA: Diagnosis not present

## 2023-03-05 DIAGNOSIS — M9903 Segmental and somatic dysfunction of lumbar region: Secondary | ICD-10-CM | POA: Diagnosis not present

## 2023-03-05 DIAGNOSIS — M9902 Segmental and somatic dysfunction of thoracic region: Secondary | ICD-10-CM | POA: Diagnosis not present

## 2023-03-05 DIAGNOSIS — M9901 Segmental and somatic dysfunction of cervical region: Secondary | ICD-10-CM | POA: Diagnosis not present

## 2023-03-05 DIAGNOSIS — M545 Low back pain, unspecified: Secondary | ICD-10-CM | POA: Diagnosis not present

## 2023-03-07 DIAGNOSIS — M9901 Segmental and somatic dysfunction of cervical region: Secondary | ICD-10-CM | POA: Diagnosis not present

## 2023-03-07 DIAGNOSIS — F4322 Adjustment disorder with anxiety: Secondary | ICD-10-CM | POA: Diagnosis not present

## 2023-03-07 DIAGNOSIS — M9902 Segmental and somatic dysfunction of thoracic region: Secondary | ICD-10-CM | POA: Diagnosis not present

## 2023-03-07 DIAGNOSIS — M9903 Segmental and somatic dysfunction of lumbar region: Secondary | ICD-10-CM | POA: Diagnosis not present

## 2023-03-07 DIAGNOSIS — M545 Low back pain, unspecified: Secondary | ICD-10-CM | POA: Diagnosis not present

## 2023-03-12 DIAGNOSIS — M9901 Segmental and somatic dysfunction of cervical region: Secondary | ICD-10-CM | POA: Diagnosis not present

## 2023-03-12 DIAGNOSIS — M9903 Segmental and somatic dysfunction of lumbar region: Secondary | ICD-10-CM | POA: Diagnosis not present

## 2023-03-12 DIAGNOSIS — M545 Low back pain, unspecified: Secondary | ICD-10-CM | POA: Diagnosis not present

## 2023-03-12 DIAGNOSIS — M9902 Segmental and somatic dysfunction of thoracic region: Secondary | ICD-10-CM | POA: Diagnosis not present

## 2023-03-14 DIAGNOSIS — M9901 Segmental and somatic dysfunction of cervical region: Secondary | ICD-10-CM | POA: Diagnosis not present

## 2023-03-14 DIAGNOSIS — M9903 Segmental and somatic dysfunction of lumbar region: Secondary | ICD-10-CM | POA: Diagnosis not present

## 2023-03-14 DIAGNOSIS — M545 Low back pain, unspecified: Secondary | ICD-10-CM | POA: Diagnosis not present

## 2023-03-14 DIAGNOSIS — M9902 Segmental and somatic dysfunction of thoracic region: Secondary | ICD-10-CM | POA: Diagnosis not present

## 2023-03-19 DIAGNOSIS — M9902 Segmental and somatic dysfunction of thoracic region: Secondary | ICD-10-CM | POA: Diagnosis not present

## 2023-03-19 DIAGNOSIS — M9903 Segmental and somatic dysfunction of lumbar region: Secondary | ICD-10-CM | POA: Diagnosis not present

## 2023-03-19 DIAGNOSIS — M545 Low back pain, unspecified: Secondary | ICD-10-CM | POA: Diagnosis not present

## 2023-03-19 DIAGNOSIS — M9901 Segmental and somatic dysfunction of cervical region: Secondary | ICD-10-CM | POA: Diagnosis not present

## 2023-03-20 DIAGNOSIS — G932 Benign intracranial hypertension: Secondary | ICD-10-CM | POA: Diagnosis not present

## 2023-03-20 DIAGNOSIS — G3184 Mild cognitive impairment, so stated: Secondary | ICD-10-CM | POA: Diagnosis not present

## 2023-03-20 DIAGNOSIS — G939 Disorder of brain, unspecified: Secondary | ICD-10-CM | POA: Diagnosis not present

## 2023-03-20 DIAGNOSIS — R5382 Chronic fatigue, unspecified: Secondary | ICD-10-CM | POA: Diagnosis not present

## 2023-03-20 DIAGNOSIS — G43119 Migraine with aura, intractable, without status migrainosus: Secondary | ICD-10-CM | POA: Diagnosis not present

## 2023-03-21 DIAGNOSIS — M9901 Segmental and somatic dysfunction of cervical region: Secondary | ICD-10-CM | POA: Diagnosis not present

## 2023-03-21 DIAGNOSIS — F4322 Adjustment disorder with anxiety: Secondary | ICD-10-CM | POA: Diagnosis not present

## 2023-03-21 DIAGNOSIS — M9903 Segmental and somatic dysfunction of lumbar region: Secondary | ICD-10-CM | POA: Diagnosis not present

## 2023-03-21 DIAGNOSIS — M545 Low back pain, unspecified: Secondary | ICD-10-CM | POA: Diagnosis not present

## 2023-03-21 DIAGNOSIS — M9902 Segmental and somatic dysfunction of thoracic region: Secondary | ICD-10-CM | POA: Diagnosis not present

## 2023-03-26 DIAGNOSIS — M545 Low back pain, unspecified: Secondary | ICD-10-CM | POA: Diagnosis not present

## 2023-03-26 DIAGNOSIS — M9901 Segmental and somatic dysfunction of cervical region: Secondary | ICD-10-CM | POA: Diagnosis not present

## 2023-03-26 DIAGNOSIS — M9902 Segmental and somatic dysfunction of thoracic region: Secondary | ICD-10-CM | POA: Diagnosis not present

## 2023-03-26 DIAGNOSIS — M9903 Segmental and somatic dysfunction of lumbar region: Secondary | ICD-10-CM | POA: Diagnosis not present

## 2023-03-28 ENCOUNTER — Other Ambulatory Visit: Payer: Self-pay | Admitting: *Deleted

## 2023-03-28 DIAGNOSIS — M9901 Segmental and somatic dysfunction of cervical region: Secondary | ICD-10-CM | POA: Diagnosis not present

## 2023-03-28 DIAGNOSIS — M9903 Segmental and somatic dysfunction of lumbar region: Secondary | ICD-10-CM | POA: Diagnosis not present

## 2023-03-28 DIAGNOSIS — M9902 Segmental and somatic dysfunction of thoracic region: Secondary | ICD-10-CM | POA: Diagnosis not present

## 2023-03-28 DIAGNOSIS — M545 Low back pain, unspecified: Secondary | ICD-10-CM | POA: Diagnosis not present

## 2023-03-28 DIAGNOSIS — Z1231 Encounter for screening mammogram for malignant neoplasm of breast: Secondary | ICD-10-CM

## 2023-04-01 ENCOUNTER — Other Ambulatory Visit: Payer: Self-pay | Admitting: Obstetrics & Gynecology

## 2023-04-01 DIAGNOSIS — Z1231 Encounter for screening mammogram for malignant neoplasm of breast: Secondary | ICD-10-CM

## 2023-04-02 DIAGNOSIS — M545 Low back pain, unspecified: Secondary | ICD-10-CM | POA: Diagnosis not present

## 2023-04-02 DIAGNOSIS — M9902 Segmental and somatic dysfunction of thoracic region: Secondary | ICD-10-CM | POA: Diagnosis not present

## 2023-04-02 DIAGNOSIS — M9901 Segmental and somatic dysfunction of cervical region: Secondary | ICD-10-CM | POA: Diagnosis not present

## 2023-04-02 DIAGNOSIS — M9903 Segmental and somatic dysfunction of lumbar region: Secondary | ICD-10-CM | POA: Diagnosis not present

## 2023-04-04 DIAGNOSIS — M9901 Segmental and somatic dysfunction of cervical region: Secondary | ICD-10-CM | POA: Diagnosis not present

## 2023-04-04 DIAGNOSIS — M545 Low back pain, unspecified: Secondary | ICD-10-CM | POA: Diagnosis not present

## 2023-04-04 DIAGNOSIS — M9902 Segmental and somatic dysfunction of thoracic region: Secondary | ICD-10-CM | POA: Diagnosis not present

## 2023-04-04 DIAGNOSIS — M9903 Segmental and somatic dysfunction of lumbar region: Secondary | ICD-10-CM | POA: Diagnosis not present

## 2023-04-05 ENCOUNTER — Other Ambulatory Visit: Payer: Self-pay | Admitting: *Deleted

## 2023-04-05 ENCOUNTER — Inpatient Hospital Stay
Admission: RE | Admit: 2023-04-05 | Discharge: 2023-04-05 | Disposition: A | Discharge status: Home / Self Care | Payer: Self-pay | Source: Ambulatory Visit | Attending: Internal Medicine | Admitting: Internal Medicine

## 2023-04-05 DIAGNOSIS — Z1231 Encounter for screening mammogram for malignant neoplasm of breast: Secondary | ICD-10-CM

## 2023-04-09 DIAGNOSIS — M9903 Segmental and somatic dysfunction of lumbar region: Secondary | ICD-10-CM | POA: Diagnosis not present

## 2023-04-09 DIAGNOSIS — M9902 Segmental and somatic dysfunction of thoracic region: Secondary | ICD-10-CM | POA: Diagnosis not present

## 2023-04-09 DIAGNOSIS — M545 Low back pain, unspecified: Secondary | ICD-10-CM | POA: Diagnosis not present

## 2023-04-09 DIAGNOSIS — M9901 Segmental and somatic dysfunction of cervical region: Secondary | ICD-10-CM | POA: Diagnosis not present

## 2023-04-11 ENCOUNTER — Inpatient Hospital Stay: Admission: RE | Admit: 2023-04-11 | Payer: 59 | Source: Ambulatory Visit

## 2023-04-16 ENCOUNTER — Encounter: Payer: Self-pay | Admitting: Radiology

## 2023-04-16 ENCOUNTER — Ambulatory Visit
Admission: RE | Admit: 2023-04-16 | Discharge: 2023-04-16 | Disposition: A | Discharge status: Home / Self Care | Payer: 59 | Source: Ambulatory Visit | Attending: Obstetrics & Gynecology | Admitting: Obstetrics & Gynecology

## 2023-04-16 DIAGNOSIS — M9903 Segmental and somatic dysfunction of lumbar region: Secondary | ICD-10-CM | POA: Diagnosis not present

## 2023-04-16 DIAGNOSIS — M545 Low back pain, unspecified: Secondary | ICD-10-CM | POA: Diagnosis not present

## 2023-04-16 DIAGNOSIS — Z1231 Encounter for screening mammogram for malignant neoplasm of breast: Secondary | ICD-10-CM | POA: Insufficient documentation

## 2023-04-16 DIAGNOSIS — M9902 Segmental and somatic dysfunction of thoracic region: Secondary | ICD-10-CM | POA: Diagnosis not present

## 2023-04-16 DIAGNOSIS — M9901 Segmental and somatic dysfunction of cervical region: Secondary | ICD-10-CM | POA: Diagnosis not present

## 2023-04-17 DIAGNOSIS — M9902 Segmental and somatic dysfunction of thoracic region: Secondary | ICD-10-CM | POA: Diagnosis not present

## 2023-04-17 DIAGNOSIS — M545 Low back pain, unspecified: Secondary | ICD-10-CM | POA: Diagnosis not present

## 2023-04-17 DIAGNOSIS — M9903 Segmental and somatic dysfunction of lumbar region: Secondary | ICD-10-CM | POA: Diagnosis not present

## 2023-04-17 DIAGNOSIS — M9901 Segmental and somatic dysfunction of cervical region: Secondary | ICD-10-CM | POA: Diagnosis not present

## 2023-04-18 ENCOUNTER — Encounter: Payer: Self-pay | Admitting: Obstetrics & Gynecology

## 2023-04-18 DIAGNOSIS — M9902 Segmental and somatic dysfunction of thoracic region: Secondary | ICD-10-CM | POA: Diagnosis not present

## 2023-04-18 DIAGNOSIS — M545 Low back pain, unspecified: Secondary | ICD-10-CM | POA: Diagnosis not present

## 2023-04-18 DIAGNOSIS — M9901 Segmental and somatic dysfunction of cervical region: Secondary | ICD-10-CM | POA: Diagnosis not present

## 2023-04-18 DIAGNOSIS — M9903 Segmental and somatic dysfunction of lumbar region: Secondary | ICD-10-CM | POA: Diagnosis not present

## 2023-04-23 DIAGNOSIS — M9902 Segmental and somatic dysfunction of thoracic region: Secondary | ICD-10-CM | POA: Diagnosis not present

## 2023-04-23 DIAGNOSIS — M545 Low back pain, unspecified: Secondary | ICD-10-CM | POA: Diagnosis not present

## 2023-04-23 DIAGNOSIS — M9901 Segmental and somatic dysfunction of cervical region: Secondary | ICD-10-CM | POA: Diagnosis not present

## 2023-04-23 DIAGNOSIS — M9903 Segmental and somatic dysfunction of lumbar region: Secondary | ICD-10-CM | POA: Diagnosis not present

## 2023-04-25 DIAGNOSIS — M9902 Segmental and somatic dysfunction of thoracic region: Secondary | ICD-10-CM | POA: Diagnosis not present

## 2023-04-25 DIAGNOSIS — M545 Low back pain, unspecified: Secondary | ICD-10-CM | POA: Diagnosis not present

## 2023-04-25 DIAGNOSIS — M9903 Segmental and somatic dysfunction of lumbar region: Secondary | ICD-10-CM | POA: Diagnosis not present

## 2023-04-25 DIAGNOSIS — M9901 Segmental and somatic dysfunction of cervical region: Secondary | ICD-10-CM | POA: Diagnosis not present

## 2023-04-30 DIAGNOSIS — M9901 Segmental and somatic dysfunction of cervical region: Secondary | ICD-10-CM | POA: Diagnosis not present

## 2023-04-30 DIAGNOSIS — M9903 Segmental and somatic dysfunction of lumbar region: Secondary | ICD-10-CM | POA: Diagnosis not present

## 2023-04-30 DIAGNOSIS — M9902 Segmental and somatic dysfunction of thoracic region: Secondary | ICD-10-CM | POA: Diagnosis not present

## 2023-04-30 DIAGNOSIS — M545 Low back pain, unspecified: Secondary | ICD-10-CM | POA: Diagnosis not present

## 2023-05-02 DIAGNOSIS — M9901 Segmental and somatic dysfunction of cervical region: Secondary | ICD-10-CM | POA: Diagnosis not present

## 2023-05-02 DIAGNOSIS — M9903 Segmental and somatic dysfunction of lumbar region: Secondary | ICD-10-CM | POA: Diagnosis not present

## 2023-05-02 DIAGNOSIS — M9902 Segmental and somatic dysfunction of thoracic region: Secondary | ICD-10-CM | POA: Diagnosis not present

## 2023-05-02 DIAGNOSIS — M545 Low back pain, unspecified: Secondary | ICD-10-CM | POA: Diagnosis not present

## 2023-05-07 DIAGNOSIS — M9903 Segmental and somatic dysfunction of lumbar region: Secondary | ICD-10-CM | POA: Diagnosis not present

## 2023-05-07 DIAGNOSIS — M9902 Segmental and somatic dysfunction of thoracic region: Secondary | ICD-10-CM | POA: Diagnosis not present

## 2023-05-07 DIAGNOSIS — M545 Low back pain, unspecified: Secondary | ICD-10-CM | POA: Diagnosis not present

## 2023-05-07 DIAGNOSIS — M9901 Segmental and somatic dysfunction of cervical region: Secondary | ICD-10-CM | POA: Diagnosis not present

## 2023-05-09 DIAGNOSIS — M9902 Segmental and somatic dysfunction of thoracic region: Secondary | ICD-10-CM | POA: Diagnosis not present

## 2023-05-09 DIAGNOSIS — M9903 Segmental and somatic dysfunction of lumbar region: Secondary | ICD-10-CM | POA: Diagnosis not present

## 2023-05-09 DIAGNOSIS — M9901 Segmental and somatic dysfunction of cervical region: Secondary | ICD-10-CM | POA: Diagnosis not present

## 2023-05-09 DIAGNOSIS — M545 Low back pain, unspecified: Secondary | ICD-10-CM | POA: Diagnosis not present

## 2023-05-14 DIAGNOSIS — M9902 Segmental and somatic dysfunction of thoracic region: Secondary | ICD-10-CM | POA: Diagnosis not present

## 2023-05-14 DIAGNOSIS — M9901 Segmental and somatic dysfunction of cervical region: Secondary | ICD-10-CM | POA: Diagnosis not present

## 2023-05-14 DIAGNOSIS — M545 Low back pain, unspecified: Secondary | ICD-10-CM | POA: Diagnosis not present

## 2023-05-14 DIAGNOSIS — M9903 Segmental and somatic dysfunction of lumbar region: Secondary | ICD-10-CM | POA: Diagnosis not present

## 2023-05-16 DIAGNOSIS — M9902 Segmental and somatic dysfunction of thoracic region: Secondary | ICD-10-CM | POA: Diagnosis not present

## 2023-05-16 DIAGNOSIS — M545 Low back pain, unspecified: Secondary | ICD-10-CM | POA: Diagnosis not present

## 2023-05-16 DIAGNOSIS — M9901 Segmental and somatic dysfunction of cervical region: Secondary | ICD-10-CM | POA: Diagnosis not present

## 2023-05-16 DIAGNOSIS — M9903 Segmental and somatic dysfunction of lumbar region: Secondary | ICD-10-CM | POA: Diagnosis not present

## 2023-05-19 DIAGNOSIS — F4322 Adjustment disorder with anxiety: Secondary | ICD-10-CM | POA: Diagnosis not present

## 2023-05-21 DIAGNOSIS — M9902 Segmental and somatic dysfunction of thoracic region: Secondary | ICD-10-CM | POA: Diagnosis not present

## 2023-05-21 DIAGNOSIS — M9901 Segmental and somatic dysfunction of cervical region: Secondary | ICD-10-CM | POA: Diagnosis not present

## 2023-05-21 DIAGNOSIS — M9903 Segmental and somatic dysfunction of lumbar region: Secondary | ICD-10-CM | POA: Diagnosis not present

## 2023-05-21 DIAGNOSIS — M545 Low back pain, unspecified: Secondary | ICD-10-CM | POA: Diagnosis not present

## 2023-05-23 DIAGNOSIS — M9901 Segmental and somatic dysfunction of cervical region: Secondary | ICD-10-CM | POA: Diagnosis not present

## 2023-05-23 DIAGNOSIS — M9902 Segmental and somatic dysfunction of thoracic region: Secondary | ICD-10-CM | POA: Diagnosis not present

## 2023-05-23 DIAGNOSIS — M9903 Segmental and somatic dysfunction of lumbar region: Secondary | ICD-10-CM | POA: Diagnosis not present

## 2023-05-23 DIAGNOSIS — M545 Low back pain, unspecified: Secondary | ICD-10-CM | POA: Diagnosis not present

## 2023-05-28 DIAGNOSIS — M545 Low back pain, unspecified: Secondary | ICD-10-CM | POA: Diagnosis not present

## 2023-05-28 DIAGNOSIS — M9903 Segmental and somatic dysfunction of lumbar region: Secondary | ICD-10-CM | POA: Diagnosis not present

## 2023-05-28 DIAGNOSIS — M9901 Segmental and somatic dysfunction of cervical region: Secondary | ICD-10-CM | POA: Diagnosis not present

## 2023-05-28 DIAGNOSIS — M9902 Segmental and somatic dysfunction of thoracic region: Secondary | ICD-10-CM | POA: Diagnosis not present

## 2023-05-30 DIAGNOSIS — M9902 Segmental and somatic dysfunction of thoracic region: Secondary | ICD-10-CM | POA: Diagnosis not present

## 2023-05-30 DIAGNOSIS — M9901 Segmental and somatic dysfunction of cervical region: Secondary | ICD-10-CM | POA: Diagnosis not present

## 2023-05-30 DIAGNOSIS — M545 Low back pain, unspecified: Secondary | ICD-10-CM | POA: Diagnosis not present

## 2023-05-30 DIAGNOSIS — M9903 Segmental and somatic dysfunction of lumbar region: Secondary | ICD-10-CM | POA: Diagnosis not present

## 2023-06-02 DIAGNOSIS — F4322 Adjustment disorder with anxiety: Secondary | ICD-10-CM | POA: Diagnosis not present

## 2023-06-04 DIAGNOSIS — M9902 Segmental and somatic dysfunction of thoracic region: Secondary | ICD-10-CM | POA: Diagnosis not present

## 2023-06-04 DIAGNOSIS — M9901 Segmental and somatic dysfunction of cervical region: Secondary | ICD-10-CM | POA: Diagnosis not present

## 2023-06-04 DIAGNOSIS — M9903 Segmental and somatic dysfunction of lumbar region: Secondary | ICD-10-CM | POA: Diagnosis not present

## 2023-06-04 DIAGNOSIS — M545 Low back pain, unspecified: Secondary | ICD-10-CM | POA: Diagnosis not present

## 2023-06-06 DIAGNOSIS — F4322 Adjustment disorder with anxiety: Secondary | ICD-10-CM | POA: Diagnosis not present

## 2023-06-06 DIAGNOSIS — M9902 Segmental and somatic dysfunction of thoracic region: Secondary | ICD-10-CM | POA: Diagnosis not present

## 2023-06-06 DIAGNOSIS — M545 Low back pain, unspecified: Secondary | ICD-10-CM | POA: Diagnosis not present

## 2023-06-06 DIAGNOSIS — M9903 Segmental and somatic dysfunction of lumbar region: Secondary | ICD-10-CM | POA: Diagnosis not present

## 2023-06-06 DIAGNOSIS — M9901 Segmental and somatic dysfunction of cervical region: Secondary | ICD-10-CM | POA: Diagnosis not present

## 2023-08-06 DIAGNOSIS — G3184 Mild cognitive impairment, so stated: Secondary | ICD-10-CM | POA: Diagnosis not present

## 2023-08-06 DIAGNOSIS — G939 Disorder of brain, unspecified: Secondary | ICD-10-CM | POA: Diagnosis not present

## 2023-08-06 DIAGNOSIS — Z1331 Encounter for screening for depression: Secondary | ICD-10-CM | POA: Diagnosis not present

## 2023-08-06 DIAGNOSIS — R5382 Chronic fatigue, unspecified: Secondary | ICD-10-CM | POA: Diagnosis not present

## 2023-08-06 DIAGNOSIS — G932 Benign intracranial hypertension: Secondary | ICD-10-CM | POA: Diagnosis not present

## 2023-08-06 DIAGNOSIS — F329 Major depressive disorder, single episode, unspecified: Secondary | ICD-10-CM | POA: Diagnosis not present

## 2023-11-07 DIAGNOSIS — R41841 Cognitive communication deficit: Secondary | ICD-10-CM | POA: Diagnosis not present

## 2023-11-12 DIAGNOSIS — R41841 Cognitive communication deficit: Secondary | ICD-10-CM | POA: Diagnosis not present

## 2023-11-25 DIAGNOSIS — R413 Other amnesia: Secondary | ICD-10-CM | POA: Diagnosis not present

## 2023-11-27 ENCOUNTER — Other Ambulatory Visit: Payer: Self-pay | Admitting: Medical Genetics

## 2023-12-03 ENCOUNTER — Other Ambulatory Visit
Admission: RE | Admit: 2023-12-03 | Discharge: 2023-12-03 | Disposition: A | Discharge status: Home / Self Care | Payer: Self-pay | Source: Ambulatory Visit | Attending: Medical Genetics | Admitting: Medical Genetics

## 2023-12-13 LAB — GENECONNECT MOLECULAR SCREEN: Genetic Analysis Overall Interpretation: NEGATIVE

## 2023-12-20 ENCOUNTER — Ambulatory Visit

## 2023-12-20 VITALS — BP 122/80 | HR 97 | Ht 64.0 in | Wt 180.4 lb

## 2023-12-20 DIAGNOSIS — G932 Benign intracranial hypertension: Secondary | ICD-10-CM

## 2023-12-20 DIAGNOSIS — K5790 Diverticulosis of intestine, part unspecified, without perforation or abscess without bleeding: Secondary | ICD-10-CM | POA: Diagnosis not present

## 2023-12-20 DIAGNOSIS — F339 Major depressive disorder, recurrent, unspecified: Secondary | ICD-10-CM | POA: Diagnosis not present

## 2023-12-20 DIAGNOSIS — R198 Other specified symptoms and signs involving the digestive system and abdomen: Secondary | ICD-10-CM | POA: Diagnosis not present

## 2023-12-20 NOTE — Progress Notes (Signed)
 Progress Note  Physician: Kandis Henry A Dhanush Jokerst, MD   HPI: [name] is a 48 y.o. female presenting on 12/20/2023 for Establish Care .  Discussed the use of AI scribe software for clinical note transcription with the patient, who gave verbal consent to proceed.  History of Present Illness   [name] is a 48 year old female who presents for a checkup and evaluation of gastrointestinal symptoms.  Altered bowel habits - Two-month history of changes in bowel movements characterized by sensation of incomplete evacuation - Describes bowel movements as 'flow, flow and then at the end' with a feeling of blockage - Sensation of incomplete evacuation persists despite dietary modifications (banana or soaked oats every morning) - No consistent rectal bleeding - Symptoms occur regardless of stool consistency - History of hemorrhoid surgery over ten years ago - Accustomed to external hemorrhoids - no history of internal hemorrhoids  Diverticular disease - History of diverticulitis - Colonoscopy in 2018 revealed no polyps but numerous diverticuli  - Has remained uncomplicated  MDD in association with Ideopathic intracranial hypertension - Episode of depression earlier in the year, attributed to her condition - Prescribed Wellbutrin , currently tapering dose from 200 mg to 100 mg due to insomnia - Insomnia associated with Wellbutrin  use - Overall improvement in mood since dose reduction  Medication use - Currently taking Diamox for idiopathic intracranial hypertension - Currently taking Strattera    Patient already established with NP Angeline Laura.      Medical history:  Relevant past medical, surgical, family and social history reviewed and updated as indicated. Interim medical history since our last visit reviewed.  Allergies and medications reviewed and updated.   ROS: Negative unless specifically indicated above in HPI.    Current Outpatient  Medications:    acetaZOLAMIDE (DIAMOX) 250 MG tablet, Take 500 mg by mouth 2 (two) times daily., Disp: , Rfl:    atomoxetine (STRATTERA) 40 MG capsule, Take 1 capsule by mouth daily., Disp: , Rfl:    atomoxetine (STRATTERA) 80 MG capsule, Take 80 mg by mouth daily., Disp: , Rfl:    buPROPion  (WELLBUTRIN ) 100 MG tablet, Take 200 mg by mouth daily., Disp: , Rfl:    cetirizine (ZYRTEC) 10 MG tablet, Take by mouth daily., Disp: , Rfl:    Cholecalciferol (VITAMIN D-1000 MAX ST) 25 MCG (1000 UT) tablet, Take by mouth., Disp: , Rfl:    cyanocobalamin  (VITAMIN B12) 1000 MCG tablet, Take by mouth., Disp: , Rfl:    fluticasone  (FLONASE ) 50 MCG/ACT nasal spray, Place into both nostrils daily., Disp: , Rfl:    magnesium oxide (MAG-OX) 400 MG tablet, Take by mouth., Disp: , Rfl:    norethindrone  (MICRONOR ) 0.35 MG tablet, Take 1 tablet (0.35 mg total) by mouth daily., Disp: 84 tablet, Rfl: 4   norethindrone  (NORLYDA ) 0.35 MG tablet, , Disp: , Rfl:    valACYclovir  (VALTREX ) 500 MG tablet, Take 1 tablet (500 mg total) by mouth daily. Can increase to twice a day for 5 days in the event of a recurrence, Disp: 30 tablet, Rfl: 12   acetaZOLAMIDE (DIAMOX) 250 MG tablet, Take by mouth., Disp: , Rfl:    acetaZOLAMIDE (DIAMOX) 250 MG tablet, Take 500 mg by mouth., Disp: , Rfl:    fluticasone  (FLONASE ) 50 MCG/ACT nasal spray, Place 1 spray into the nose as needed., Disp: , Rfl:    PARAGARD  INTRAUTERINE COPPER  IU, by Intrauterine route. (Patient not taking: Reported on 01/08/2023), Disp: ,  Rfl:        Objective:     BP 122/80   Pulse 97   Ht 5' 4 (1.626 m)   Wt 180 lb 6.4 oz (81.8 kg)   LMP 11/10/2023 (Approximate)   SpO2 99%   BMI 30.97 kg/m   Wt Readings from Last 3 Encounters:  12/20/23 180 lb 6.4 oz (81.8 kg)  01/08/23 193 lb (87.5 kg)  01/04/22 212 lb (96.2 kg)    Physical Exam  Physical Exam Vitals reviewed.  Constitutional:      Appearance: Normal appearance. Well-developed with normal  weight.  Cardiovascular:     Rate and Rhythm: Normal rate and regular rhythm. Normal heart sounds. Normal peripheral pulses Pulmonary:     Normal breath sounds with normal effort Skin:    General: Skin is warm and dry without noticeable rash. Neurological:     General: No focal deficit present.  Psychiatric:        Mood and Affect: Mood, behavior and cognition normal    Abdomen:  Soft, NT/ND, no constipation noted  Assessment & Plan:   Encounter Diagnoses  Name Primary?   Idiopathic intracranial hypertension Yes   Recurrent major depressive disorder, remission status unspecified    Diverticulosis    Abnormal findings-gastrointestinal tract     Orders Placed This Encounter  Procedures   CBC with Differential/Platelet   Comprehensive metabolic panel with GFR   Hemoglobin A1c   Lipid panel   Urinalysis, Routine w reflex microscopic   Ambulatory referral to Gastroenterology     Assessment and Plan    Diverticulosis of intestine and altered bowel habits Reports altered bowel habits with sensation of incomplete evacuation. Previous colonoscopy showed diverticulosi  - Symptoms may be related to scar tissue or recurrent hemorrhoids. - She already has a GI appt but does need a referral to gastroenterology for evaluation and possible short scope to assess for scar tissue or internal hemorrhoids.  Depression - stable Episode earlier this year. Currently on Wellbutrin , reduced dose due to insomnia. - Continue Wellbutrin  through winter and reassess in spring.  Intracranial hypertension  - Condition stable with acetazolamide. She will have ongoing f/u with neurology  - Basic labs CBC/CMP, lipids prior to next appt with her primary care provider  General Health Maintenance No flu shot receive - declined.   Blood pressure well controlled.

## 2024-01-01 ENCOUNTER — Other Ambulatory Visit

## 2024-01-01 DIAGNOSIS — F339 Major depressive disorder, recurrent, unspecified: Secondary | ICD-10-CM

## 2024-01-01 DIAGNOSIS — G932 Benign intracranial hypertension: Secondary | ICD-10-CM | POA: Diagnosis not present

## 2024-01-02 LAB — COMPREHENSIVE METABOLIC PANEL WITH GFR
AG Ratio: 1.6 (calc) (ref 1.0–2.5)
ALT: 15 U/L (ref 6–29)
AST: 15 U/L (ref 10–35)
Albumin: 4.2 g/dL (ref 3.6–5.1)
Alkaline phosphatase (APISO): 78 U/L (ref 31–125)
BUN: 12 mg/dL (ref 7–25)
CO2: 21 mmol/L (ref 20–32)
Calcium: 8.7 mg/dL (ref 8.6–10.2)
Chloride: 112 mmol/L — ABNORMAL HIGH (ref 98–110)
Creat: 0.79 mg/dL (ref 0.50–0.99)
Globulin: 2.7 g/dL (ref 1.9–3.7)
Glucose, Bld: 83 mg/dL (ref 65–99)
Potassium: 3.8 mmol/L (ref 3.5–5.3)
Sodium: 139 mmol/L (ref 135–146)
Total Bilirubin: 0.3 mg/dL (ref 0.2–1.2)
Total Protein: 6.9 g/dL (ref 6.1–8.1)
eGFR: 93 mL/min/1.73m2 (ref >=60)

## 2024-01-02 LAB — URINALYSIS, ROUTINE W REFLEX MICROSCOPIC
Bilirubin Urine: NEGATIVE
Glucose, UA: NEGATIVE
Hgb urine dipstick: NEGATIVE
Ketones, ur: NEGATIVE
Leukocytes,Ua: NEGATIVE
Nitrite: NEGATIVE
Protein, ur: NEGATIVE
Specific Gravity, Urine: 1.017 (ref 1.001–1.035)
pH: >=9 — AB (ref 5.0–8.0)

## 2024-01-02 LAB — HEMOGLOBIN A1C
Hgb A1c MFr Bld: 5.1 % (ref <5.7)
Mean Plasma Glucose: 100 mg/dL
eAG (mmol/L): 5.5 mmol/L

## 2024-01-02 LAB — CBC WITH DIFFERENTIAL/PLATELET
Absolute Lymphocytes: 1318 {cells}/uL (ref 850–3900)
Absolute Monocytes: 352 {cells}/uL (ref 200–950)
Basophils Absolute: 51 {cells}/uL (ref 0–200)
Basophils Relative: 0.8 %
Eosinophils Absolute: 32 {cells}/uL (ref 15–500)
Eosinophils Relative: 0.5 %
HCT: 40.9 % (ref 35.0–45.0)
Hemoglobin: 13.3 g/dL (ref 11.7–15.5)
MCH: 30.3 pg (ref 27.0–33.0)
MCHC: 32.5 g/dL (ref 32.0–36.0)
MCV: 93.2 fL (ref 80.0–100.0)
MPV: 10.4 fL (ref 7.5–12.5)
Monocytes Relative: 5.5 %
Neutro Abs: 4646 {cells}/uL (ref 1500–7800)
Neutrophils Relative %: 72.6 %
Platelets: 239 Thousand/uL (ref 140–400)
RBC: 4.39 Million/uL (ref 3.80–5.10)
RDW: 11.2 % (ref 11.0–15.0)
Total Lymphocyte: 20.6 %
WBC: 6.4 Thousand/uL (ref 3.8–10.8)

## 2024-01-02 LAB — LIPID PANEL
Cholesterol: 158 mg/dL (ref <200)
HDL: 53 mg/dL (ref >=50)
LDL Cholesterol (Calc): 91 mg/dL
Non-HDL Cholesterol (Calc): 105 mg/dL (ref <130)
Total CHOL/HDL Ratio: 3 (calc) (ref <5.0)
Triglycerides: 59 mg/dL (ref <150)

## 2024-01-07 DIAGNOSIS — G3184 Mild cognitive impairment, so stated: Secondary | ICD-10-CM | POA: Diagnosis not present

## 2024-01-07 DIAGNOSIS — G514 Facial myokymia: Secondary | ICD-10-CM | POA: Diagnosis not present

## 2024-01-07 DIAGNOSIS — G932 Benign intracranial hypertension: Secondary | ICD-10-CM | POA: Diagnosis not present

## 2024-01-07 DIAGNOSIS — F329 Major depressive disorder, single episode, unspecified: Secondary | ICD-10-CM | POA: Diagnosis not present

## 2024-01-07 DIAGNOSIS — G43119 Migraine with aura, intractable, without status migrainosus: Secondary | ICD-10-CM | POA: Diagnosis not present

## 2024-01-08 ENCOUNTER — Ambulatory Visit (INDEPENDENT_AMBULATORY_CARE_PROVIDER_SITE_OTHER): Admitting: Certified Nurse Midwife

## 2024-01-08 ENCOUNTER — Encounter: Payer: Self-pay | Admitting: Certified Nurse Midwife

## 2024-01-08 DIAGNOSIS — Z01419 Encounter for gynecological examination (general) (routine) without abnormal findings: Secondary | ICD-10-CM

## 2024-01-08 DIAGNOSIS — Z124 Encounter for screening for malignant neoplasm of cervix: Secondary | ICD-10-CM

## 2024-01-08 DIAGNOSIS — Z3041 Encounter for surveillance of contraceptive pills: Secondary | ICD-10-CM

## 2024-01-08 MED ORDER — NORETHINDRONE 0.35 MG PO TABS: 1.0000 | ORAL_TABLET | Freq: Every day | ORAL | 4 refills | Status: AC

## 2024-01-09 ENCOUNTER — Encounter: Admitting: Internal Medicine

## 2024-01-10 ENCOUNTER — Ambulatory Visit: Admitting: Internal Medicine

## 2024-01-10 ENCOUNTER — Other Ambulatory Visit: Payer: Self-pay | Admitting: Obstetrics & Gynecology

## 2024-01-10 ENCOUNTER — Encounter: Payer: Self-pay | Admitting: Internal Medicine

## 2024-01-10 VITALS — BP 124/82 | Ht 64.0 in | Wt 177.0 lb

## 2024-01-10 DIAGNOSIS — E66811 Obesity, class 1: Secondary | ICD-10-CM | POA: Diagnosis not present

## 2024-01-10 DIAGNOSIS — E6609 Other obesity due to excess calories: Secondary | ICD-10-CM | POA: Diagnosis not present

## 2024-01-10 DIAGNOSIS — A6000 Herpesviral infection of urogenital system, unspecified: Secondary | ICD-10-CM | POA: Insufficient documentation

## 2024-01-10 DIAGNOSIS — Z6830 Body mass index (BMI) 30.0-30.9, adult: Secondary | ICD-10-CM

## 2024-01-10 DIAGNOSIS — F330 Major depressive disorder, recurrent, mild: Secondary | ICD-10-CM | POA: Diagnosis not present

## 2024-01-10 DIAGNOSIS — R41840 Attention and concentration deficit: Secondary | ICD-10-CM | POA: Insufficient documentation

## 2024-01-10 DIAGNOSIS — R194 Change in bowel habit: Secondary | ICD-10-CM

## 2024-01-10 DIAGNOSIS — A6004 Herpesviral vulvovaginitis: Secondary | ICD-10-CM | POA: Diagnosis not present

## 2024-01-10 DIAGNOSIS — G932 Benign intracranial hypertension: Secondary | ICD-10-CM | POA: Diagnosis not present

## 2024-01-10 NOTE — Assessment & Plan Note (Signed)
 Continue valacyclovir  500 mg as needed for outbreaks

## 2024-01-10 NOTE — Assessment & Plan Note (Signed)
 Continue bupropion  100 mg daily She plans to wean this medication starting in the spring Support offered

## 2024-01-10 NOTE — Progress Notes (Signed)
 Subjective:    Patient ID: [name], female    DOB: Jan 18, 1976, 48 y.o.   MRN: [id number]  HPI  Patient presents to clinic today for follow-up of chronic conditions.  Depression: Chronic, managed on bupropion . She plans on weaning off this in the spring. She is not currently seeing a therapist.  She denies anxiety, SI/HI.  Intracranial hypertension: She reports mainly visual spatial deficits. Managed with acetazolamide.  She follows with neurology.  Difficulty focusing: She reports she has not been diagnosed with ADHD but has difficulty concentrating and focusing due to her intracranial hypertension.  She is taking atomoxetine as prescribed by her neurologist.  Genital HSV: Managed with valacyclovir  only as needed for outbreaks.  She does feel like she has been having some difficulty passing her bowels.  She feels like there may be a blockage.  She has some intermittent rectal pain and itching.  She has seen blood in her stool in the past but not recently.  She would like a referral to [facility] for further evaluation of her symptoms.  Review of Systems     Past Medical History:  Diagnosis Date   Depression    Diverticulosis 2018   Frequent headaches    Heart murmur    History of chicken pox    Idiopathic intracranial hypertension 07/12/2021    Current Outpatient Medications  Medication Sig Dispense Refill   acetaZOLAMIDE (DIAMOX) 250 MG tablet Take by mouth.     acetaZOLAMIDE (DIAMOX) 250 MG tablet Take 500 mg by mouth.     acetaZOLAMIDE (DIAMOX) 250 MG tablet Take 500 mg by mouth 2 (two) times daily.     atomoxetine (STRATTERA) 40 MG capsule Take 1 capsule by mouth daily. (Patient not taking: Reported on 01/08/2024)     atomoxetine (STRATTERA) 80 MG capsule Take 80 mg by mouth daily.     buPROPion  (WELLBUTRIN ) 100 MG tablet Take 200 mg by mouth daily.     cetirizine (ZYRTEC) 10 MG tablet Take by mouth daily.     Cholecalciferol (VITAMIN D-1000 MAX ST) 25 MCG (1000  UT) tablet Take by mouth.     cyanocobalamin  (VITAMIN B12) 1000 MCG tablet Take by mouth.     magnesium oxide (MAG-OX) 400 MG tablet Take by mouth.     norethindrone  (MICRONOR ) 0.35 MG tablet Take 1 tablet (0.35 mg total) by mouth daily. 84 tablet 4   norethindrone  (NORLYDA ) 0.35 MG tablet      PARAGARD  INTRAUTERINE COPPER  IU by Intrauterine route. (Patient not taking: Reported on 01/08/2024)     valACYclovir  (VALTREX ) 500 MG tablet Take 1 tablet (500 mg total) by mouth daily. Can increase to twice a day for 5 days in the event of a recurrence 30 tablet 12   No current facility-administered medications for this visit.    No Known Allergies  Family History  Problem Relation Age of Onset   Heart disease Mother        Unsure   Breast cancer Neg Hx     Social History   Socioeconomic History   Marital status: Divorced    Spouse name: Not on file   Number of children: Not on file   Years of education: Not on file   Highest education level: Not on file  Occupational History   Not on file  Tobacco Use   Smoking status: Never   Smokeless tobacco: Never  Vaping Use   Vaping status: Never Used  Substance and Sexual Activity   Alcohol use:  Not Currently   Drug use: Never   Sexual activity: Yes    Birth control/protection: None  Other Topics Concern   Not on file  Social History Narrative   Not on file   Social Drivers of Health   Financial Resource Strain: Not on file  Food Insecurity: Not on file  Transportation Needs: Not on file  Physical Activity: Not on file  Stress: Not on file  Social Connections: Not on file  Intimate Partner Violence: Not on file     Constitutional: Denies fever, malaise, fatigue, headache or abrupt weight changes.  HEENT: Denies eye pain, eye redness, ear pain, ringing in the ears, wax buildup, runny nose, nasal congestion, bloody nose, or sore throat. Respiratory: Denies difficulty breathing, shortness of breath, cough or sputum production.    Cardiovascular: Denies chest pain, chest tightness, palpitations or swelling in the hands or feet.  Gastrointestinal: Patient reports rectal pain and itching.  Denies abdominal pain, bloating, constipation, diarrhea or blood in the stool.  GU: Denies urgency, frequency, pain with urination, burning sensation, blood in urine, odor or discharge. Musculoskeletal: Denies decrease in range of motion, difficulty with gait, muscle pain or joint swelling.  Skin: Denies redness, rashes, lesions or ulcercations.  Neurological: Denies dizziness, difficulty with memory, difficulty with speech or problems with balance and coordination.  Psych: Pt has a history of depression. Denies anxiety, SI/HI.  No other specific complaints in a complete review of systems (except as listed in HPI above).  Objective:   Physical Exam BP 124/82 (BP Location: Left Arm, Patient Position: Sitting, Cuff Size: Normal)   Ht 5' 4 (1.626 m)   Wt 177 lb (80.3 kg)   LMP 11/10/2023 (Approximate)   BMI 30.38 kg/m    Wt Readings from Last 3 Encounters:  12/20/23 180 lb 6.4 oz (81.8 kg)  01/08/23 193 lb (87.5 kg)  01/04/22 212 lb (96.2 kg)    General: Appears her stated age, obese, in NAD. Skin: Warm, dry and intact.  HEENT: Head: normal shape and size; Eyes: sclera white, no icterus, conjunctiva pink, PERRLA and EOMs intact;  Cardiovascular: Normal rate and rhythm. S1,S2 noted.  No murmur, rubs or gallops noted. No JVD or BLE edema.  Pulmonary/Chest: Normal effort and positive vesicular breath sounds. No respiratory distress. No wheezes, rales or ronchi noted.  Abdomen: Normal bowel sounds.  Rectal: Hemorrhoidal tags noted externally.  Normal rectal tone.  Hemoccult negative. Musculoskeletal: No difficulty with gait.  Neurological: Alert and oriented. Cranial nerves II-XII grossly intact. Coordination normal.  Psychiatric: Mood and affect normal. Behavior is normal. Judgment and thought content normal.   BMET     Component Value Date/Time   NA 139 01/01/2024 0854   NA 138 02/26/2019 1052   NA 140 06/04/2014 1335   K 3.8 01/01/2024 0854   K 4.2 06/04/2014 1335   CL 112 (H) 01/01/2024 0854   CL 107 06/04/2014 1335   CO2 21 01/01/2024 0854   CO2 27 06/04/2014 1335   GLUCOSE 83 01/01/2024 0854   GLUCOSE 106 (H) 06/04/2014 1335   BUN 12 01/01/2024 0854   BUN 6 02/26/2019 1052   BUN 9 06/04/2014 1335   CREATININE 0.79 01/01/2024 0854   CALCIUM 8.7 01/01/2024 0854   CALCIUM 9.4 06/04/2014 1335   GFRNONAA >60 07/07/2021 1416   GFRNONAA >60 06/04/2014 1335   GFRAA >60 04/25/2019 0207   GFRAA >60 06/04/2014 1335    Lipid Panel     Component Value Date/Time   CHOL  158 01/01/2024 0854   TRIG 59 01/01/2024 0854   HDL 53 01/01/2024 0854   CHOLHDL 3.0 01/01/2024 0854   VLDL 12.8 04/26/2020 1116   LDLCALC 91 01/01/2024 0854    CBC    Component Value Date/Time   WBC 6.4 01/01/2024 0854   RBC 4.39 01/01/2024 0854   HGB 13.3 01/01/2024 0854   HGB 13.3 02/05/2019 0840   HCT 40.9 01/01/2024 0854   HCT 38.7 02/05/2019 0840   PLT 239 01/01/2024 0854   PLT 211 02/05/2019 0840   MCV 93.2 01/01/2024 0854   MCV 95 02/05/2019 0840   MCV 91 06/04/2014 1335   MCH 30.3 01/01/2024 0854   MCHC 32.5 01/01/2024 0854   RDW 11.2 01/01/2024 0854   RDW 11.8 02/05/2019 0840   RDW 12.6 06/04/2014 1335   LYMPHSABS 1.7 07/07/2021 1416   LYMPHSABS 1.3 10/09/2018 1048   LYMPHSABS 2.0 06/04/2014 1335   MONOABS 0.5 07/07/2021 1416   MONOABS 0.5 06/04/2014 1335   EOSABS 32 01/01/2024 0854   EOSABS 0.0 10/09/2018 1048   EOSABS 0.1 06/04/2014 1335   BASOSABS 51 01/01/2024 0854   BASOSABS 0.0 10/09/2018 1048   BASOSABS 0.1 06/04/2014 1335    Hgb A1C Lab Results  Component Value Date   HGBA1C 5.1 01/01/2024            Assessment & Plan:   Bowel habit changes:  Discussed likelihood of internal hemorrhoids versus rectocele Encouraged high-fiber diet and adequate water intake Referral to GI  for further evaluation per her request  RTC in 6 months for annual exam  Angeline Laura, NP

## 2024-01-10 NOTE — Assessment & Plan Note (Signed)
 Continue acetazolamide 500 mg twice daily per neurology Will monitor

## 2024-01-10 NOTE — Assessment & Plan Note (Signed)
 Encouraged diet and exercise for weight loss ?

## 2024-01-10 NOTE — Assessment & Plan Note (Signed)
 Secondary to intracranial hypertension Continue atomoxetine 80 mg daily as prescribed by neurology We will monitor

## 2024-01-12 NOTE — Progress Notes (Unsigned)
   GYNECOLOGY OFFICE VISIT NOTE  History:   [name] is a 48 y.o. [phone] here today for annual well woman exam. She reports normal periods. Reports light with birth control. She endorses some perimenopausal symptoms occasionally but denies consistent symptoms.  She denies any abnormal vaginal discharge, bleeding, pelvic pain or other concerns.     Past Medical History:  Diagnosis Date   Depression    Diverticulosis 2018   Frequent headaches    Heart murmur    History of chicken pox    Idiopathic intracranial hypertension 07/12/2021    Past Surgical History:  Procedure Laterality Date   COLONOSCOPY WITH PROPOFOL  N/A 11/28/2016   Procedure: COLONOSCOPY WITH PROPOFOL ;  Surgeon: Dessa Reyes ORN, MD;  Location: [facility] ENDOSCOPY;  Service: Endoscopy;  Laterality: N/A;   EXCISIONAL HEMORRHOIDECTOMY  2014    The following portions of the patient's history were reviewed and updated as appropriate: allergies, current medications, past family history, past medical history, past social history, past surgical history and problem list.   Health Maintenance:  Normal pap and negative HRHPV on 09/19/2021.  Normal mammogram on 04/18/2023.   Review of Systems:  Pertinent items noted in HPI and remainder of comprehensive ROS otherwise negative.  Physical Exam:  LMP 11/10/2023 (Approximate)  CONSTITUTIONAL: Well-developed, well-nourished female in no acute distress.  HEENT:  Normocephalic, atraumatic. External right and left ear normal. No scleral icterus.  NECK: Normal range of motion, supple, no masses noted on observation SKIN: No rash noted. Not diaphoretic. No erythema. No pallor. MUSCULOSKELETAL: Normal range of motion. No edema noted. NEUROLOGIC: Alert and oriented to person, place, and time. Normal muscle tone coordination. No cranial nerve deficit noted. PSYCHIATRIC: Normal mood and affect. Normal behavior. Normal judgment and thought content. CARDIOVASCULAR: Normal heart rate  noted RESPIRATORY: Effort and breath sounds normal, no problems with respiration noted ABDOMEN: No masses noted. No other overt distention noted.   PELVIC: Deferred  Labs and Imaging No results found for this or any previous visit (from the past week). No results found.    Assessment and Plan:    1. Encounter for well woman exam with routine gynecological exam (Primary) - Patient doing well.  - Some questions answered about perimenopausal symptoms. Reviewed expectations with continuing birth control and patient agreeable to plan.   2. Cervical cancer screening - Next pap due 2026-2027  3. Oral contraceptive pill surveillance - Refill on pills today.  - norethindrone  (MICRONOR ) 0.35 MG tablet; Take 1 tablet (0.35 mg total) by mouth daily.  Dispense: 84 tablet; Refill: 4   Routine preventative health maintenance measures emphasized. Please refer to After Visit Summary for other counseling recommendations.   Return in about 1 year (around 01/07/2025) for [place] .    I spent 30 minutes dedicated to the care of this patient including pre-visit review of records, face to face time with the patient discussing her conditions and treatments and post visit orders.    Krupa Stege Erven) Emilio, MSN, CNM  [facility]  01/13/24 4:22 AM

## 2024-02-04 ENCOUNTER — Telehealth: Payer: Self-pay

## 2024-02-04 NOTE — Telephone Encounter (Signed)
 Copied from CRM #[id number]. Topic: Referral - Question >> Feb 04, 2024  8:58 AM Revonda D wrote: Reason for CRM: Pt stated that she was recently referred to a [facility] office but the office she was referred to isn't accepting new patients. Pt stated that she contacted [facility] [employer] and they stated that they are accepting patients. Pt stated that she needs a new referral submitted to this office. Phone:[phone], Fax:[phone].
